# Patient Record
Sex: Female | Born: 2000 | Race: Black or African American | Hispanic: No | Marital: Single | Smoking: Never smoker
Health system: Southern US, Community
[De-identification: ages and names within clinical notes are randomized; demographics above are authoritative.]

## PROBLEM LIST (undated history)

## (undated) DIAGNOSIS — F419 Anxiety disorder, unspecified: Secondary | ICD-10-CM

## (undated) DIAGNOSIS — L309 Dermatitis, unspecified: Secondary | ICD-10-CM

## (undated) DIAGNOSIS — F32A Depression, unspecified: Secondary | ICD-10-CM

## (undated) DIAGNOSIS — L500 Allergic urticaria: Secondary | ICD-10-CM

## (undated) DIAGNOSIS — E559 Vitamin D deficiency, unspecified: Secondary | ICD-10-CM

## (undated) DIAGNOSIS — G610 Guillain-Barre syndrome: Secondary | ICD-10-CM

## (undated) HISTORY — DX: Anxiety disorder, unspecified: F41.9

## (undated) HISTORY — DX: Depression, unspecified: F32.A

## (undated) HISTORY — PX: HIP SURGERY: SHX245

## (undated) HISTORY — DX: Allergic urticaria: L50.0

## (undated) HISTORY — DX: Vitamin D deficiency, unspecified: E55.9

## (undated) HISTORY — DX: Guillain-Barre syndrome: G61.0

---
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
## 2022-01-27 ENCOUNTER — Ambulatory Visit (INDEPENDENT_AMBULATORY_CARE_PROVIDER_SITE_OTHER): Payer: Medicaid Other | Admitting: Neurology

## 2022-01-27 ENCOUNTER — Encounter: Payer: Self-pay | Admitting: Neurology

## 2022-01-27 ENCOUNTER — Encounter: Payer: Medicaid Other | Admitting: Neurology

## 2022-01-27 VITALS — BP 113/76 | HR 108 | Ht 62.0 in | Wt 118.6 lb

## 2022-01-27 DIAGNOSIS — R200 Anesthesia of skin: Secondary | ICD-10-CM

## 2022-01-27 DIAGNOSIS — R29898 Other symptoms and signs involving the musculoskeletal system: Secondary | ICD-10-CM

## 2022-01-27 DIAGNOSIS — G510 Bell's palsy: Secondary | ICD-10-CM

## 2022-01-27 DIAGNOSIS — Z8669 Personal history of other diseases of the nervous system and sense organs: Secondary | ICD-10-CM | POA: Diagnosis not present

## 2022-01-27 NOTE — Patient Instructions (Addendum)
I am not sure I can explain all your findings but we need to do additional testing as explained.  We will do EMG and nerve conduction velocity testing tomorrow in this office.  We will pursue a brain MRI and neck MRI with and without contrast to soon as possible and we will do blood test today.  I would like to exclude a condition called Guillain-Barr syndrome which is an acute form of neuropathy.  If you have any sudden onset of worsening weakness or numbness or shortness of breath, or any new symptoms that are disturbing, please call 911 or proceed to the nearest emergency room.

## 2022-01-27 NOTE — Progress Notes (Signed)
Subjective:    Patient ID: Savannah Alvarez is a 21 y.o. female.  HPI    Alvarez Age, MD, PhD [facility] [address], [address] [place], [place] [address]   Dear Quillian Quince,  Your patient, [name], upon your kind request in my neurologic clinic today for evaluation of her paresthesias.  The patient is accompanied by her mom and her young niece today.  As you know, [name] is a 21 year old female with an underlying medical history of headache, recent right hip surgery and pelvis injury repair some 4 weeks ago, and eczema, who reports a fairly sudden onset of numbness and tingling affecting both upper and lower extremities about a week after her hip surgery.  She was initially using crutches for about a week but then regressed and was even in the wheelchair for a couple of weeks in between and just now graduated back to a walker but is maneuvering it with difficulty.  She has had weakness in the distal upper and lower extremities for the past 3 to maybe 4 weeks.   She checked in with her surgeon who did not think her symptoms were related to her right hip surgery and also did repeat x-rays as I understand.  She presented to the emergency room through [facility] on 12/28/2021 with shortness of breath.  I reviewed the emergency room records.  She had blood work and CT angiogram of the chest for PE evaluation, testing was negative.  She had numbness and tingling at the time.  She also reports a recent episode of Bell's palsy some 2 or 3 weeks ago on the right side which is completely resolved, she was not treated for this especially not with prednisone as she had recently undergone right hip surgery and prednisone was not recommended.  She reports a progressive degree of numbness and weakness in her feet and hands, she feels that she has plateaued at this time, maybe slightly better, shortness of breath is also improved.  She has fallen several times.  She has trouble  maneuvering her 2 wheeled walker due to grip strength weakness.  She had no further imaging test, no recent blood work through your office.  She reports having sustained pelvic fracture and needing reconstruction as well as surgery to the right hip.  She has not yet started physical therapy but has seen a chiropractor.   I reviewed your office visit note from 01/14/2022.  She had right hip surgery in October 2023.  She had sustained an injury during cheerleading as I understand.    I had evaluated her in 2022 for recurrent headaches.  She was last seen in this office by Debbora Presto, NP on 09/09/2021, at which time she was advised to increase gabapentin for headache prevention.  She was advised to take rizatriptan 10 mg strength as needed for migraine headaches.  She was reminded to stay well-hydrated.  She missed an interim appointment with Debbora Presto, NP on 12/23/2020.  Previously:   05/15/20: 21 year old female with an underlying benign medical history except eczema, who Presents for follow-up consultation of her recurrent headaches.  The patient is accompanied by her mom again today.  I first met her on 02/29/2020 at the request of her primary care nurse practitioner, at which time she reported a recent sports related accident about 2 months prior.  She was diagnosed with a mild concussion.  She had a nonfocal exam, overall felt a little better but had recurrent headaches.  She was not sleeping very well.  She also reported a prior history of intermittent migraines.  I suggested she start low-dose amitriptyline.  She was planning to go back to [place] for college.  Her mother called in the interim reporting that she still has headaches.  We arranged for follow-up appointment since she was not able to see somebody in [place].   Today, 05/15/2020: She reports that she can go a day or 2 without headaches but lately, she has had a daily headache, it is typically in the bifrontal and bitemporal areas, can be  constant or throbbing, sometimes she has light sensitivity and nausea, sometimes she does not have nausea.  She stopped taking the amitriptyline earlier this month and noticed a slight worsening of her headache but did not notice any benefit when she was on it and she titrated up to the 2 pills daily.  She has not been doing any cheerleading exercises as she has not been able to participate.  She has been trying to use her eyeglasses consistently and she has tried to hydrate well with water, tries to rest.  She has been taking over-the-counter Tylenol and Advil, Advil seems to help a little more and has infected taking 2 pills every 2-3 hours on a nearly daily basis.  Her primary care nurse practitioner has provided her with a sample of Nurtec, she used it recently and thought it helped a little bit.     02/29/20: (She) had a fall about 2 months ago.  She fell during tumbling, at her college, in [place]. She fell backwards and hit her head, no loss of consciousness or laceration were sustained, thankfully.  She had seen a school physician, the athletic doctor, and was apparently diagnosed with mild concussion.  She did not have any x-rays or other imaging done.  Mom says that she has amnesia for the moments preceding the fall.  I reviewed your office note from 01/12/20. She was advised to go to the ER. She presented to the emergency room on 01/12/2020 with complaints of headaches, light sensitivity, nausea, emotional lability, dizziness, and memory impairment.  I reviewed the emergency room records.  She had a normal neurological exam, normal vital signs, GCS 15.  She did not receive any treatment in the emergency room or imaging tests, was provided with instructions for cognitive rest and proper stepwise return to sports. She reports that she still has intermittent headaches, they occur almost every other day.  They are mostly in the frontal areas, right or left side or in the middle.  She has occasional nausea, no  vomiting, occasional light sensitivity but not every time.  Overall, she feels a little better.  She has been using Tylenol over-the-counter, 500 mg strength 2 pills twice daily or up to 3 times a day.  Mom believes that she is doing better thankfully.  She has occasional blurry vision.  She does have prescription eyeglasses and had a recent eye examination in August 2021 but not since her head injury.  She denies any sudden onset of one-sided weakness or numbness or tingling or droopy face or slurring of speech or hearing loss or vision loss.  No double vision, only occasional blurry vision.  She denies any significant depression or anxiety.  She is currently not on any prescription medication.  She does not sleep very well.    Her Past Medical History Is Significant For: Past Medical History:  Diagnosis Date   Eczema     Her  Past Surgical History Is Significant For: Past Surgical History:  Procedure Laterality Date   HIP SURGERY Right     Her Family History Is Significant For: Family History  Problem Relation Age of Onset   Hypertension Father    Diabetes Father    Hyperthyroidism Mother    Diabetes Maternal Grandmother    Hypertension Maternal Grandmother    Diabetes Maternal Grandfather    Hypertension Maternal Grandfather     Her Social History Is Significant For: Social History   Socioeconomic History   Marital status: Single    Spouse name: Not on file   Number of children: 0   Years of education: Not on file   Highest education level: Some college, no degree  Occupational History    Comment: student in college  Tobacco Use   Smoking status: Never    Passive exposure: Never   Smokeless tobacco: Never  Vaping Use   Vaping Use: Never used  Substance and Sexual Activity   Alcohol use: Never   Drug use: Never   Sexual activity: Yes    Birth control/protection: None  Other Topics Concern   Not on file  Social History Narrative   Lives at college   1 can of [place]  per day   Social Determinants of Health   Financial Resource Strain: Not on file  Food Insecurity: Not on file  Transportation Needs: Not on file  Physical Activity: Not on file  Stress: Not on file  Social Connections: Not on file    Her Allergies Are:  Allergies  Allergen Reactions   Other Swelling    Onions, Cantaloupe & tomato  :   Her Current Medications Are:  Outpatient Encounter Medications as of 01/27/2022  Medication Sig   omalizumab Arvid Right) 150 MG/ML prefilled syringe Inject into the skin.   cetirizine (ZYRTEC) 10 MG tablet TAKE 1 TABLET BY MOUTH EVERY EVENING FOR ITCHING OR RUNNY NOSE (Patient not taking: Reported on 01/27/2022)   diclofenac Sodium (VOLTAREN) 1 % GEL Apply 4 g topically 4 (four) times daily. (Patient not taking: Reported on 01/27/2022)   gabapentin (NEURONTIN) 100 MG capsule Take 1 capsule (100 mg total) by mouth 3 (three) times daily. Take up to 338m daily. (Patient not taking: Reported on 01/27/2022)   meloxicam (MOBIC) 7.5 MG tablet Take 1 tablet (7.5 mg total) by mouth daily. (Patient not taking: Reported on 01/27/2022)   montelukast (SINGULAIR) 10 MG tablet Take 1 tablet (10 mg total) by mouth at bedtime. (Patient not taking: Reported on 01/27/2022)   rizatriptan (MAXALT-MLT) 5 MG disintegrating tablet Take 2 tablets (10 mg total) by mouth as needed for migraine. May repeat in 2 hours if needed, no more than 2 pills/24 h (Patient not taking: Reported on 01/27/2022)   tiZANidine (ZANAFLEX) 4 MG tablet Take 1 tablet (4 mg total) by mouth every 6 (six) hours as needed for muscle spasms. (Patient not taking: Reported on 01/27/2022)   No facility-administered encounter medications on file as of 01/27/2022.  :   Review of Systems:  Out of a complete 14 point review of systems, all are reviewed and negative with the exception of these symptoms as listed below:   Review of Systems  Neurological:        Pt is here for Consult on Numbness and Tingling in both  legs. Pt states that the numbness and tingling started in October. Pt had surgery on right hip. Pt states that she has fallen 7 times. Pt states she has  a fall every week just about.    Objective:  Neurological Exam  Physical Exam Physical Examination:   Vitals:   01/27/22 1029  BP: 113/76  Pulse: (!) 108   General Examination: The patient is a very pleasant 21 y.o. female in no acute distress. She appears well-developed and well-nourished and well groomed.  Mildly anxious appearing, has a 2 wheeled walker.  HEENT: Normocephalic, atraumatic, pupils are equal, round and reactive to light.  No obvious Bell's phenomenon upon eye closure, fairly symmetrical forehead wrinkling.  Extraocular tracking is good without limitation to gaze excursion or nystagmus noted. Normal smooth pursuit is noted.  No photophobia.  Hearing is grossly intact. Face is symmetric with normal facial animation and normal facial sensation. Speech is clear with no dysarthria noted. There is no hypophonia. There is no lip, neck/head, jaw or voice tremor. Neck is supple with full range of passive and active motion. There are no carotid bruits on auscultation. Oropharynx exam reveals: mild mouth dryness, good dental hygiene. Tongue protrudes centrally and palate elevates symmetrically.    Chest: Clear to auscultation without wheezing, rhonchi or crackles noted.   Heart: S1+S2+0, regular and normal without murmurs, rubs or gallops noted.    Abdomen: Soft, non-tender and non-distended with normal bowel sounds appreciated on auscultation.   Extremities: There is no pitting edema in the distal lower extremities bilaterally.    Skin: Warm and dry without trophic changes noted.   Musculoskeletal: exam reveals no obvious joint deformities but has limited range of motion in the right hip with pain reported.    Neurologically:  Mental status: The patient is awake, alert and oriented in all 4 spheres. Her immediate and remote  memory, attention, language skills and fund of knowledge are appropriate. There is no evidence of aphasia, agnosia, apraxia or anomia. Speech is clear with normal prosody and enunciation. Thought process is linear. Mood is normal and affect is normal.  Cranial nerves II - XII are as described above under HEENT exam. Motor exam: Thin bulk, distal strength in both hands about 4 out of 5, distal strength in both feet about 4 out of 5.  Reflexes are trace in the upper extremities, absent in the lower extremities, Babinski testing she has equivocal findings.  She has trouble standing and pushes herself up.  She has trouble standing on her tiptoes.  Proximal strength is closer to 5 out of 5 in the upper and lower extremities but limited with hip flexion on the right. Romberg is not tested due to safety concerns.  Fine motor skills and coordination: She has mild fine motor difficulties with both upper and lower extremities in the hands as well as both feet.   Cerebellar testing: No dysmetria or intention tremor on finger to nose testing. Heel to shin is difficult for her.    Sensory exam: She has decreased sensation to pinprick, vibration, temperature, and light touch in the distal upper and lower extremities, up to above wrist areas bilaterally and up to above ankle areas bilaterally, symmetrical.  Normal sensation in the face.   Gait, station and balance: She stands with difficulty and pushes herself up.  She walks rather slowly with her 2 wheeled walker.  She also reports pain in the right hip.     Assessment and Plan:    In summary, Jaretzi Droz is a very pleasant 21 year old female with an underlying medical history of headache, recent right hip surgery and pelvis injury repair some 4 weeks ago, and eczema,  who presents for evaluation of her rather acute onset of bilateral upper extremity and bilateral lower extremity weakness and numbness affecting her in the distal upper and lower extremities bilaterally  symmetrically.  Symptoms and presentation are concerning for an acute peripheral neuropathy, with differential diagnosis primarily of AIDP or Guillain-Barr syndrome.  She does have decreased strength, decreased sensation, and decreased reflexes particularly distally bilaterally.  Symptoms and findings are symmetrical, she also had a recent episode of Bell's palsy on the right side which seems to have resolved completely without any obvious sequelae.  She also reports recent shortness of breath which could be tied in together, it is possible that she has reached a plateau and may be on the mend but further testing is warranted.  She is advised to proceed with EMG and nerve conduction velocity testing, we were able to find her an appointment for tomorrow in this clinic.  She is advised to do some blood work today, we will check inflammatory markers, CK level, CMP, TSH.  She is advised that I would like to proceed with an outpatient brain MRI and cervical spine MRI with and without contrast.  She had no obvious prodromal illness other than recent surgery but no GI illness, does report occasional nausea recently.  She and her mom are strongly advised to proceed to the emergency room or call 911 should she have shortness of breath, sudden worsening of symptoms or weakness or new symptoms.  Mom reports that they were planning to go on a cruise next week or in about a week from now and I advised him not to go on a cruise or any travel until we have better understanding as to what is going on.  This was an extended visit of over 1 hour due to extended chart review and the multiple tests, explaining the test, also consulting with my colleague Dr. Krista Blue to get her urgently on the schedule for EMG and nerve conduction velocity testing, which I highly appreciate.  I answered all their questions today and the patient and her mom were in agreement with our plan.  Thank you very much for allowing me to participate in the care of  this nice patient. If I can be of any further assistance to you please do not hesitate to call me at [phone].  Sincerely,   Alvarez Age, MD, PhD

## 2022-01-28 ENCOUNTER — Other Ambulatory Visit: Payer: Self-pay

## 2022-01-28 ENCOUNTER — Ambulatory Visit (INDEPENDENT_AMBULATORY_CARE_PROVIDER_SITE_OTHER): Payer: Medicaid Other | Admitting: Neurology

## 2022-01-28 ENCOUNTER — Inpatient Hospital Stay (HOSPITAL_COMMUNITY)
Admission: EM | Admit: 2022-01-28 | Discharge: 2022-02-02 | DRG: 094 | Disposition: A | Discharge status: Home / Self Care | Payer: Medicaid Other | Attending: Internal Medicine | Admitting: Internal Medicine

## 2022-01-28 ENCOUNTER — Ambulatory Visit: Payer: Medicaid Other | Admitting: Neurology

## 2022-01-28 ENCOUNTER — Telehealth: Payer: Self-pay | Admitting: *Deleted

## 2022-01-28 VITALS — BP 141/92 | HR 114 | Ht 62.0 in | Wt 118.0 lb

## 2022-01-28 DIAGNOSIS — G610 Guillain-Barre syndrome: Secondary | ICD-10-CM

## 2022-01-28 DIAGNOSIS — Z91018 Allergy to other foods: Secondary | ICD-10-CM

## 2022-01-28 DIAGNOSIS — G510 Bell's palsy: Secondary | ICD-10-CM

## 2022-01-28 DIAGNOSIS — R269 Unspecified abnormalities of gait and mobility: Secondary | ICD-10-CM

## 2022-01-28 DIAGNOSIS — R296 Repeated falls: Secondary | ICD-10-CM | POA: Diagnosis present

## 2022-01-28 DIAGNOSIS — Q6589 Other specified congenital deformities of hip: Secondary | ICD-10-CM

## 2022-01-28 DIAGNOSIS — Z6821 Body mass index (BMI) 21.0-21.9, adult: Secondary | ICD-10-CM

## 2022-01-28 DIAGNOSIS — M25551 Pain in right hip: Secondary | ICD-10-CM | POA: Diagnosis present

## 2022-01-28 DIAGNOSIS — R200 Anesthesia of skin: Secondary | ICD-10-CM

## 2022-01-28 DIAGNOSIS — E876 Hypokalemia: Secondary | ICD-10-CM | POA: Diagnosis present

## 2022-01-28 DIAGNOSIS — Z8669 Personal history of other diseases of the nervous system and sense organs: Secondary | ICD-10-CM

## 2022-01-28 DIAGNOSIS — H538 Other visual disturbances: Secondary | ICD-10-CM | POA: Diagnosis present

## 2022-01-28 DIAGNOSIS — Z8249 Family history of ischemic heart disease and other diseases of the circulatory system: Secondary | ICD-10-CM

## 2022-01-28 DIAGNOSIS — E43 Unspecified severe protein-calorie malnutrition: Secondary | ICD-10-CM | POA: Diagnosis present

## 2022-01-28 DIAGNOSIS — G8929 Other chronic pain: Secondary | ICD-10-CM | POA: Diagnosis present

## 2022-01-28 DIAGNOSIS — Z833 Family history of diabetes mellitus: Secondary | ICD-10-CM

## 2022-01-28 DIAGNOSIS — D649 Anemia, unspecified: Secondary | ICD-10-CM | POA: Diagnosis present

## 2022-01-28 DIAGNOSIS — R29898 Other symptoms and signs involving the musculoskeletal system: Secondary | ICD-10-CM

## 2022-01-28 LAB — BASIC METABOLIC PANEL
Anion gap: 9 (ref 5–15)
BUN: 17 mg/dL (ref 6–20)
CO2: 22 mmol/L (ref 22–32)
Calcium: 9.9 mg/dL (ref 8.9–10.3)
Chloride: 103 mmol/L (ref 98–111)
Creatinine, Ser: 0.65 mg/dL (ref 0.44–1.00)
GFR, Estimated: >60 mL/min (ref >60)
Glucose, Bld: 85 mg/dL (ref 70–99)
Potassium: 3.5 mmol/L (ref 3.5–5.1)
Sodium: 134 mmol/L — ABNORMAL LOW (ref 135–145)

## 2022-01-28 LAB — URINALYSIS, ROUTINE W REFLEX MICROSCOPIC
Bilirubin Urine: NEGATIVE
Glucose, UA: NEGATIVE mg/dL
Hgb urine dipstick: NEGATIVE
Ketones, ur: 80 mg/dL — AB
Leukocytes,Ua: NEGATIVE
Nitrite: NEGATIVE
Protein, ur: 30 mg/dL — AB
Specific Gravity, Urine: 1.026 (ref 1.005–1.030)
pH: 5 (ref 5.0–8.0)

## 2022-01-28 LAB — CBC
HCT: 34.4 % — ABNORMAL LOW (ref 36.0–46.0)
Hemoglobin: 10.7 g/dL — ABNORMAL LOW (ref 12.0–15.0)
MCH: 25.5 pg — ABNORMAL LOW (ref 26.0–34.0)
MCHC: 31.1 g/dL (ref 30.0–36.0)
MCV: 82.1 fL (ref 80.0–100.0)
Platelets: 457 10*3/uL — ABNORMAL HIGH (ref 150–400)
RBC: 4.19 MIL/uL (ref 3.87–5.11)
RDW: 14.6 % (ref 11.5–15.5)
WBC: 5.3 10*3/uL (ref 4.0–10.5)
nRBC: 0 % (ref 0.0–0.2)

## 2022-01-28 LAB — I-STAT BETA HCG BLOOD, ED (MC, WL, AP ONLY): I-stat hCG, quantitative: <5 m[IU]/mL (ref <5)

## 2022-01-28 MED ORDER — ONDANSETRON 4 MG PO TBDP
4.0000 mg | ORAL_TABLET | Freq: Once | ORAL | Status: AC
  Administered 2022-01-28: 4 mg via ORAL
  Filled 2022-01-28: qty 1

## 2022-01-28 NOTE — ED Provider Triage Note (Signed)
Emergency Medicine Provider Triage Evaluation Note  Savannah Alvarez , a 21 y.o. female  was evaluated in triage.  Pt complains of weakness. Report having extremities weakness, numbness and recurrent falls sinces Oct of this year.  Was seen by neurologist Dr. 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  Sent here for admission, likely IVIG or plasma exchange, will need LP for crystalalbumin dissociation.  Endorse nausea.  No fever  Review of Systems  Positive: As above Negative: As above  Physical Exam  BP 130/82 (BP Location: Right Arm)   Pulse 100   Temp 98.1 F (36.7 C) (Oral)   Resp 16   SpO2 100%  Gen:   Awake, no distress   Resp:  Normal effort  MSK:   Moves extremities without difficulty  Other:    Medical Decision Making  Medically screening exam initiated at 4:29 PM.  Appropriate orders placed.  Savannah Alvarez was informed that the remainder of the evaluation will be completed by another provider, this initial triage assessment does not replace that evaluation, and the importance of remaining in the ED until their evaluation is complete.    Savannah Helper, PA-C 01/28/22 1631

## 2022-01-28 NOTE — Telephone Encounter (Signed)
Fyi.

## 2022-01-28 NOTE — ED Triage Notes (Signed)
Pt presents after being sent by Neurologist.  Pt states she has not been able to feel her fingers/hands/feet x 3 weeks.  Had nerve conduction study today and was told to come here for eval.

## 2022-01-28 NOTE — Procedures (Signed)
Full Name: Savannah Alvarez Gender: Female MRN #: [id number] Date of Birth: 02/12/2001    Visit Date: 01/28/2022 12:48 Age: 21 Years Examining Physician: Dr. Levert Feinstein Referring Physician: Dr. Frances Furbish Height: 5 feet 2 inch History: 21 year old female status post right periacetabular osteotomy on December 15, 2021, developed acute onset of bilateral upper and lower extremity numbness and weakness a week after, reached nadir about 4 weeks after symptoms onset, not still have persistent distal weakness numbness, fell multiple times  Summary of the Test:  Nerve conduction study:  Right sural, superficial peroneal, ulnar, median sensory responses were absent.  Right tibial, peroneal to EDB and ulnar motor responses were absent.  Electromyography: Selected needle examination of right upper, lower extremity muscles, right cervical and lumbosacral paraspinal muscles; right facial muscle performed.  There is evidence of increased insertional activity, mild spontaneous activity at distal upper and lower extremity muscles, with complex large motor unit potential decreased recruitment patterns.  There is also evidence of neuropathic changes involving right orbicularis oculi and frontalis muscles.  There is also evidence of increased insertional activity, polyphasic complex motor unit potential at right lumbosacral paraspinals, suggestive of remodeling process.   Conclusion: This is an abnormal study.  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    ------------------------------- Levert Feinstein M.D. PhD  [facility] [address] [place], [place] [address] Tel: [phone] Fax: [phone]  Verbal informed consent was obtained from the patient, patient was informed of potential risk of procedure, including bruising, bleeding, hematoma formation, infection, muscle  weakness, muscle pain, numbness, among others.        MNC    Nerve / Sites Muscle Latency Ref. Amplitude Ref. Rel Amp Segments Distance Ref. Area    ms ms mV mV %  cm m/s mVms  R Ulnar - ADM     Wrist ADM 21.9 ?3.3 0.1 ?6.0 100 Wrist - ADM 7       B.Elbow ADM 21.5  1.4  1825 B.Elbow - Wrist  ?49      A.Elbow ADM      A.Elbow - B.Elbow  ?49   R Peroneal - EDB     Ankle EDB NR ?6.5 NR ?2.0 NR Ankle - EDB 9  NR         Pop fossa - Ankle     R Tibial - AH     Ankle AH NR ?5.8 NR ?4.0 NR Ankle - AH 9  NR             SNC    Nerve / Sites Rec. Site Peak Lat Ref.  Amp Ref. Segments Distance    ms ms V V  cm  R Radial - Anatomical snuff box (Forearm)     Forearm Wrist 2.7 ?2.9 5 ?15 Forearm - Wrist 10  R Sural - Ankle (Calf)     Calf Ankle NR ?4.4 NR ?6 Calf - Ankle 14  R Superficial peroneal - Ankle     Lat leg Ankle NR ?4.4 NR ?6 Lat leg - Ankle 14  R Median - Orthodromic (Dig II, Mid palm)     Dig II Wrist NR ?3.4 NR ?10 Dig II - Wrist 13  R Ulnar - Orthodromic, (Dig V, Mid palm)     Dig V Wrist NR ?3.1 NR ?5 Dig V - Wrist 11  EMG Summary Table    Spontaneous MUAP Recruitment  Muscle IA Fib PSW Fasc Other Amp Dur. Poly Pattern  R. Tibialis anterior Increased 1+ None None _______ Increased Increased 1+ Reduced  R. Tibialis posterior Increased 1+ None None _______ Increased Increased 1+ Reduced  R. Peroneus longus Increased None None None _______ Increased Increased 1+ Reduced  R. Gastrocnemius (Medial head) Increased None None None _______ Normal Normal Normal Reduced  R. Vastus lateralis Normal None None None _______ Normal Normal Normal Reduced  R. Lumbar paraspinals (low) Normal None None None _______ Normal Normal Normal Normal  R. Lumbar paraspinals (mid) Normal None None None _______ Normal Normal Normal Normal  R. First dorsal interosseous Increased 1+ None None _______ Increased Increased 1+ Reduced  R. Abductor digiti minimi (manus) Increased 1+ None None  _______ Increased Increased 1+ Reduced  R. Brachioradialis Increased None None None _______ Normal Normal Normal Reduced  R. Biceps brachii Normal None None None _______ Normal Normal Normal Reduced  R. Deltoid Normal None None None _______ Normal Normal Normal Reduced  R. Triceps brachii Normal None None None _______ Normal Normal Normal Reduced  R. Cervical paraspinals Normal None None None _______ Normal Normal Normal Normal  R. Frontalis Increased None None None _______ Increased Increased 1+ Reduced  R. Orbicularis oculi Increased None None None _______ Increased Increased 1+ Reduced

## 2022-01-28 NOTE — Progress Notes (Signed)
Chief Complaint  Patient presents with   Procedure    Rm EMG/NCV 4.       ASSESSMENT AND PLAN  Savannah Alvarez is a 21 y.o. 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  DIAGNOSTIC DATA (LABS, IMAGING, TESTING) - I reviewed patient records, labs, notes, testing and imaging myself where available.  Laboratory evaluation January 27, 2022, normal or negative RPR, TSH, ANA, B6, vitamin D, A1c, rheumatoid factor, B1 CPK slightly elevated 188, repeat CMP showed normal liver functional test, calcium was mildly elevated 10.5, ESR was elevated 45, hemoglobin has improved to 11.8   MEDICAL HISTORY:  Savannah Alvarez is a 21 years old female, accompanied by her mother, referred by Dr. Rexene Alberts for electrodiagnostic study for subacute onset weakness, numbness.    I reviewed and summarized the referring note. PMHX.  She had right hip surgery periacetabular osteotomy at [facility] on December 15, 2021, initially recovered well,  pain was well controlled  Second week around December 22, 2021 woke up noticed numbness tingling and numbness of shooting pain at bilateral lower extremity from ankle down, bilateral hands from the wrist down, she also noticed clumsiness of right hand, could not hold her crutches well, fell multiple times since then  Was treated at [facility] emergency room on December 28, 2021, also with complaints of shortness of breath, facial weakness, was found to have mild anemia hemoglobin of 10, mild elevation of AST 84 ALT of 112, magnesium was decreased 1.8, vitamin D level was less than 8  She was given magnesium supplement,  CT PE was negative  EKG showed sinus rhythm, heart rate of 96 bpm  Follow-up visit with orthopedic surgeon December 30, 2021 showed postsurgical change of right periacetabular osteotomy, no new fracture, no hardware complications,  Orthopedic follow-up again January 13, 2022, with complaints of worsening upper and lower extremity weakness, numbness, also developed facial weakness, was given the diagnosis of Bell's palsy, need eyedrop to close her eyes, fell several times, no longer needing narcotics for pain control,  Patient reported her worst time was before Thanksgiving, when she has shortness of breath, palpitation, difficulty closing her eyes, profound bilateral upper and lower extremity weakness, numbness, since then, she seems to have made slow progress, still have numbness, but not as dense, not as painful, continue to have weakness, she can close her eyes a little bit better,  Electrodiagnostic study January 28, 2022 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, mainly demyelinating, with mild axonal component   PHYSICAL EXAM:   Vitals:   01/28/22 1256  BP: (!) 141/92  Pulse: (!) 114  Weight: 118 lb (  53.5 kg)  Height: _0  (1.575 m)   Body mass index is 21.58 kg/m.  PHYSICAL EXAMNIATION:  Gen: NAD, conversant, well nourised, well groomed                      Cardiovascular: Regular rate rhythm, no peripheral edema, warm, nontender. Eyes: Conjunctivae clear without exudates or hemorrhage Neck: Supple, no carotid bruits. Pulmonary: Clear to auscultation bilaterally   NEUROLOGICAL EXAM:  MENTAL STATUS: Speech/cognition: Tearful at today's clinical visit CRANIAL NERVES: CN II: Visual fields are full to confrontation. Pupils are round equal and briskly reactive to light. CN III, IV, VI: extraocular movement are normal. No ptosis. CN V: Facial sensation is intact to light touch CN VII: Face is symmetric with normal eye closure  CN VIII: Hearing is normal to causal conversation. CN IX, X: Phonation is normal. CN XI: Head turning and shoulder shrug are intact  MOTOR: Proximal upper extremity motor strength is normal, mild bilateral handgrip, mild to moderate finger flexion extension weakness.  Right lower extremity motor strength is limited due to recent right hip surgery, mild left hip flexion weakness, moderate left ankle dorsiflexion and plantarflexion weakness  REFLEXES: Areflexia  SENSORY: Length-dependent decreased light touch, pinprick, vibratory sensation to mid shin level, absent toe proprioception, Decreased finger vibratory sensation, and finger proprioception  COORDINATION: There is no trunk or limb dysmetria noted.  GAIT/STANCE: Need help to get up from seated position, rely on her walker, very unsteady  REVIEW OF SYSTEMS:  Full 14 system review of systems performed and notable only for as above All other review of systems were negative.   ALLERGIES: Allergies  Allergen Reactions   Other Swelling    Onions, Cantaloupe & tomato    HOME MEDICATIONS: Current Outpatient Medications  Medication Sig Dispense Refill   cetirizine (ZYRTEC) 10 MG tablet TAKE 1 TABLET BY MOUTH EVERY EVENING FOR ITCHING OR RUNNY NOSE (Patient not taking: Reported on 01/27/2022) 30 tablet 5   diclofenac Sodium (VOLTAREN) 1 % GEL  Apply 4 g topically 4 (four) times daily. (Patient not taking: Reported on 01/27/2022) 100 g 0   gabapentin (NEURONTIN) 100 MG capsule Take 1 capsule (100 mg total) by mouth 3 (three) times daily. Take up to 345m daily. (Patient not taking: Reported on 01/27/2022) 30 capsule 3   meloxicam (MOBIC) 7.5 MG tablet Take 1 tablet (7.5 mg total) by mouth daily. (Patient not taking: Reported on 01/27/2022) 20 tablet 0   montelukast (SINGULAIR) 10 MG tablet Take 1 tablet (10 mg total) by mouth at bedtime. (Patient not taking: Reported on 01/27/2022) 30 tablet 5   omalizumab (XOLAIR) 150 MG/ML prefilled syringe Inject into the skin.     rizatriptan (MAXALT-MLT) 5 MG disintegrating tablet Take 2 tablets (10 mg total) by mouth as needed for migraine. May repeat in 2 hours if needed, no more than 2 pills/24 h (Patient not taking: Reported on 01/27/2022) 10 tablet 5   tiZANidine (ZANAFLEX) 4 MG tablet Take 1 tablet (4 mg total) by mouth every 6 (six) hours as needed for muscle spasms. (Patient not taking: Reported on 01/27/2022) 30 tablet 0   No current facility-administered medications for this visit.    PAST MEDICAL HISTORY: Past Medical History:  Diagnosis Date   Eczema     PAST SURGICAL HISTORY: Past Surgical History:  Procedure Laterality Date   HIP SURGERY Right     FAMILY HISTORY: Family History  Problem Relation Age of Onset   Hypertension Father  Diabetes Father    Hyperthyroidism Mother    Diabetes Maternal Grandmother    Hypertension Maternal Grandmother    Diabetes Maternal Grandfather    Hypertension Maternal Grandfather     SOCIAL HISTORY: Social History   Socioeconomic History   Marital status: Single    Spouse name: Not on file   Number of children: 0   Years of education: Not on file   Highest education level: Some college, no degree  Occupational History    Comment: student in college  Tobacco Use   Smoking status: Never    Passive exposure: Never   Smokeless  tobacco: Never  Vaping Use   Vaping Use: Never used  Substance and Sexual Activity   Alcohol use: Never   Drug use: Never   Sexual activity: Yes    Birth control/protection: None  Other Topics Concern   Not on file  Social History Narrative   Lives at college   1 can of [place] per day   Social Determinants of Health   Financial Resource Strain: Not on file  Food Insecurity: Not on file  Transportation Needs: Not on file  Physical Activity: Not on file  Stress: Not on file  Social Connections: Not on file  Intimate Partner Violence: Not on file      Marcial Pacas, M.D. Ph.D.  [facility] [address] [place], [place] [address] Ph: [phone] Fax: [phone]  CC:  Jeannene Patella, [place] [address],  [place] [id number]  Jeannene Patella, PA

## 2022-01-28 NOTE — Telephone Encounter (Signed)
-----   Message from Huston Foley, MD sent at 01/28/2022  3:02 PM EST ----- Nothing further to do, patient and mom have been notified while they were here and advised to proceed to the ER for admission for AIDP (GBS), w/u and treament.

## 2022-01-28 NOTE — ED Notes (Signed)
Pt's mother notified RN that she was going to give pt's scheduled medications while they are waiting.

## 2022-01-28 NOTE — ED Provider Notes (Signed)
[facility] EMERGENCY DEPARTMENT Provider Note   CSN: [id number] Arrival date & time: 01/28/22  1426     History  Chief Complaint  Patient presents with   Numbness    Nikky Duba is a 21 y.o. female.  21 y/o female presents to the ED at the request of Neurology for admission. She has been having progressive numbness and weakness of her extremities with increased falls. 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 She states that she woke up with numbness and pain in her b/l hands and feet. She was "unable to move my toes correctly". The symptoms have remained constant and are worsening. She began to have increased falls because she was unable to grip her crutches well. Patient reports some associated b/l blurry vision which is worse after waking in the morning and at night before bed. She also developed symptoms of Bell's palsy ~3 weeks ago on the left, including weakness of the L eyelid and L side of her mouth. These symptoms have been gradually improving. Denies fevers.  Patient had an EMG prior to arrival which was concerning for AIDP. Was sent to the ED for admission and IVIG or plasma exchange.  The history is provided by the patient. No language interpreter was used.       Home Medications Prior to Admission medications   Medication Sig Start Date End Date Taking? Authorizing Provider  acetaminophen (TYLENOL) 500 MG tablet Take 500 mg by mouth every 6 (six) hours as needed for moderate pain.   Yes [provider]  diazepam (VALIUM) 5 MG tablet Take 5 mg by mouth every 8 (eight) hours as needed for anxiety.   Yes [provider]  famotidine (PEPCID) 20 MG tablet Take 20 mg by mouth daily as needed for heartburn or indigestion.   Yes [provider]  omalizumab Geoffry Paradise) 150 MG/ML prefilled syringe Inject 300 mg into the skin every 28 (twenty-eight) days. 05/03/20  Yes [provider]   ondansetron (ZOFRAN) 4 MG tablet Take 4 mg by mouth every 8 (eight) hours as needed for nausea or vomiting.   Yes [provider]  oxyCODONE (ROXICODONE) 15 MG immediate release tablet Take 7.5 mg by mouth every 4 (four) hours as needed for pain.   Yes [provider]  sertraline (ZOLOFT) 50 MG tablet Take 50 mg by mouth daily.   Yes [provider]  cetirizine (ZYRTEC) 10 MG tablet TAKE 1 TABLET BY MOUTH EVERY EVENING FOR ITCHING OR RUNNY NOSE Patient not taking: Reported on 01/27/2022 07/21/16   Kozlow, Alvira Philips, MD  diclofenac Sodium (VOLTAREN) 1 % GEL Apply 4 g topically 4 (four) times daily. Patient not taking: Reported on 01/27/2022 08/31/21   Gailen Shelter, PA  gabapentin (NEURONTIN) 100 MG capsule Take 1 capsule (100 mg total) by mouth 3 (three) times daily. Take up to 300mg  daily. Patient not taking: Reported on 01/27/2022 09/09/20   Lomax, Amy, NP  meloxicam (MOBIC) 7.5 MG tablet Take 1 tablet (7.5 mg total) by mouth daily. Patient not taking: Reported on 01/27/2022 08/31/21   11/01/21, PA  montelukast (SINGULAIR) 10 MG tablet Take 1 tablet (10 mg total) by mouth at bedtime. Patient not taking: Reported on 01/27/2022 07/21/16   07/23/16, MD  rizatriptan (MAXALT-MLT) 5 MG disintegrating tablet Take 2 tablets (10 mg total) by mouth as needed for migraine. May repeat in 2 hours if needed, no more than 2  pills/24 h Patient not taking: Reported on 01/27/2022 09/09/20   Lomax, Amy, NP  tiZANidine (ZANAFLEX) 4 MG tablet Take 1 tablet (4 mg total) by mouth every 6 (six) hours as needed for muscle spasms. Patient not taking: Reported on 01/27/2022 09/23/20   Wallis BambergMani, Mario, PA-C      Allergies    Other    Review of Systems   Review of Systems Ten systems reviewed and are negative for acute change, except as noted in the HPI.    Physical Exam Updated Vital Signs BP 126/74 (BP Location: Right Arm)   Pulse 87   Temp 97.9 F (36.6 C) (Oral)   Resp 16   Ht 5\' 2"   (1.575 m)   Wt 53.3 kg   SpO2 100%   BMI 21.49 kg/m   Physical Exam Vitals and nursing note reviewed.  Constitutional:      General: She is not in acute distress.    Appearance: She is well-developed. She is not diaphoretic.     Comments: Nontoxic appearing and in NAD  HENT:     Head: Normocephalic and atraumatic.  Eyes:     General: No scleral icterus.    Conjunctiva/sclera: Conjunctivae normal.     Comments: Weakness of the L eyelid. EOMs intact.  Pulmonary:     Effort: Pulmonary effort is normal. No respiratory distress.     Comments: Respirations even and unlabored Musculoskeletal:        General: Normal range of motion.     Cervical back: Normal range of motion.  Skin:    General: Skin is warm and dry.     Coloration: Skin is not pale.     Findings: No erythema or rash.  Neurological:     Mental Status: She is alert and oriented to person, place, and time.     Comments: GCS 15. Speech is clear, goal oriented. There is flattening of the left nasolabial fold; weakness to the left corder of the mouth. Sensation to light touch absent in BLE; unable to differentiate between touch of the left or right side. 4/5 strength in BLE with preserved strength against resistance in all major muscle groups of bilateral upper extremities. Grips 5/5 on exam.  Psychiatric:        Behavior: Behavior normal.     ED Results / Procedures / Treatments   Labs (all labs ordered are listed, but only abnormal results are displayed) Labs Reviewed  BASIC METABOLIC PANEL - Abnormal; Notable for the following components:      Result Value   Sodium 134 (*)    All other components within normal limits  CBC - Abnormal; Notable for the following components:   Hemoglobin 10.7 (*)    HCT 34.4 (*)    MCH 25.5 (*)    Platelets 457 (*)    All other components within normal limits  URINALYSIS, ROUTINE W REFLEX MICROSCOPIC - Abnormal; Notable for the following components:   APPearance HAZY (*)     Ketones, ur 80 (*)    Protein, ur 30 (*)    Bacteria, UA FEW (*)    All other components within normal limits  CSF CULTURE W GRAM STAIN  CSF CELL COUNT WITH DIFFERENTIAL  PROTEIN AND GLUCOSE, CSF  OLIGOCLONAL BANDS, CSF + SERM  DRAW EXTRA CLOT TUBE  MENINGITIS/ENCEPHALITIS PANEL (CSF)  IGG CSF INDEX  DRAW EXTRA CLOT TUBE  LYME DISEASE DNA BY PCR(BORRELIA BURG)  VDRL, CSF  CK  MAGNESIUM  PHOSPHORUS  HEPATIC FUNCTION  PANEL  PREALBUMIN  TSH  VITAMIN B12  FOLATE  IRON AND TIBC  FERRITIN  RETICULOCYTES  CBG MONITORING, ED  I-STAT BETA HCG BLOOD, ED ([facility], [facility], [facility] ONLY)    EKG EKG Interpretation  Date/Time:  Wednesday January 28 2022 16:42:07 EST Ventricular Rate:  100 PR Interval:  116 QRS Duration: 84 QT Interval:  320 QTC Calculation: 412 R Axis:   84 Text Interpretation: Normal sinus rhythm Nonspecific ST and T wave abnormality Abnormal ECG Interpretation limited secondary to artifact No previous ECGs available Confirmed by Elayne Snare ([id number]) on 01/28/2022 9:20:30 PM  Radiology NCV with EMG(electromyography)  Result Date: 01/28/2022 Levert Feinstein, MD     01/28/2022  2:34 PM     Full Name: Sariyah Corcino Gender: Female MRN #: [id number] Date of Birth: [birth year]   Visit Date: 01/28/2022 12:48 Age: 60 Years Examining Physician: Dr. Levert Feinstein Referring Physician: Dr. Frances Furbish Height: 5 feet 2 inch History: 21 year old female status post right periacetabular osteotomy on December 15, 2021, developed acute onset of bilateral upper and lower extremity numbness and weakness a week after, reached nadir about 4 weeks after symptoms onset, not still have persistent distal weakness numbness, fell multiple times Summary of the Test: Nerve conduction study: Right sural, superficial peroneal, ulnar, median sensory responses were absent. Right tibial, peroneal to EDB and ulnar motor responses were absent. Electromyography: Selected needle examination of right upper, lower extremity muscles, right  cervical and lumbosacral paraspinal muscles; right facial muscle performed. There is evidence of increased insertional activity, mild spontaneous activity at distal upper and lower extremity muscles, with complex large motor unit potential decreased recruitment patterns. There is also evidence of neuropathic changes involving right orbicularis oculi and frontalis muscles. There is also evidence of increased insertional activity, polyphasic complex motor unit potential at right lumbosacral paraspinals, suggestive of remodeling process. Conclusion: This is an abnormal study.  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 ------------------------------- Levert Feinstein M.D. PhD [facility] [address], [address] [place], [place] [address] Tel: [phone] Fax: [phone] Verbal informed consent was obtained from the patient, patient was informed of potential risk of procedure, including bruising, bleeding, hematoma formation, infection, muscle weakness, muscle pain, numbness, among others.     MNC   Nerve / Sites Muscle Latency Ref. Amplitude Ref. Rel Amp Segments Distance Ref. Area   ms ms mV mV %  cm m/s mVms R Ulnar - ADM    Wrist ADM 21.9 ?3.3 0.1 ?6.0 100 Wrist - ADM 7      B.Elbow ADM 21.5  1.4  1825 B.Elbow - Wrist  ?49     A.Elbow ADM      A.Elbow - B.Elbow  ?49  R Peroneal - EDB    Ankle EDB NR ?6.5 NR ?2.0 NR Ankle - EDB 9  NR        Pop fossa - Ankle    R Tibial - AH    Ankle AH NR ?5.8 NR ?4.0 NR Ankle - AH 9  NR           SNC   Nerve / Sites Rec. Site Peak Lat Ref.  Amp Ref. Segments Distance   ms ms V V  cm R Radial - Anatomical snuff box (Forearm)    Forearm Wrist 2.7 ?2.9 5 ?15 Forearm - Wrist 10 R Sural - Ankle (Calf)    Calf Ankle NR ?4.4 NR ?6 Calf - Ankle 14 R Superficial  peroneal - Ankle    Lat leg Ankle NR ?4.4 NR ?6 Lat leg - Ankle 14 R Median - Orthodromic (Dig II, Mid palm)    Dig II  Wrist NR ?3.4 NR ?10 Dig II - Wrist 13 R Ulnar - Orthodromic, (Dig V, Mid palm)    Dig V Wrist NR ?3.1 NR ?5 Dig V - Wrist 11             EMG Summary Table   Spontaneous MUAP Recruitment Muscle IA Fib PSW Fasc Other Amp Dur. Poly Pattern R. Tibialis anterior Increased 1+ None None _______ Increased Increased 1+ Reduced R. Tibialis posterior Increased 1+ None None _______ Increased Increased 1+ Reduced R. Peroneus longus Increased None None None _______ Increased Increased 1+ Reduced R. Gastrocnemius (Medial head) Increased None None None _______ Normal Normal Normal Reduced R. Vastus lateralis Normal None None None _______ Normal Normal Normal Reduced R. Lumbar paraspinals (low) Normal None None None _______ Normal Normal Normal Normal R. Lumbar paraspinals (mid) Normal None None None _______ Normal Normal Normal Normal R. First dorsal interosseous Increased 1+ None None _______ Increased Increased 1+ Reduced R. Abductor digiti minimi (manus) Increased 1+ None None _______ Increased Increased 1+ Reduced R. Brachioradialis Increased None None None _______ Normal Normal Normal Reduced R. Biceps brachii Normal None None None _______ Normal Normal Normal Reduced R. Deltoid Normal None None None _______ Normal Normal Normal Reduced R. Triceps brachii Normal None None None _______ Normal Normal Normal Reduced R. Cervical paraspinals Normal None None None _______ Normal Normal Normal Normal R. Frontalis Increased None None None _______ Increased Increased 1+ Reduced R. Orbicularis oculi Increased None None None _______ Increased Increased 1+ Reduced     Procedures .Lumbar Puncture  Date/Time: 01/29/2022 1:37 AM  Performed by: Antony Madura, PA-C Authorized by: Antony Madura, PA-C   Consent:    Consent obtained:  Verbal   Consent given by:  Patient   Risks, benefits, and alternatives were discussed: yes     Risks discussed:  Bleeding, pain, infection and headache   Alternatives discussed:  Delayed  treatment Universal protocol:    Procedure explained and questions answered to patient or proxy's satisfaction: yes     Relevant documents present and verified: yes     Test results available: yes     Imaging studies available: yes     Required blood products, implants, devices, and special equipment available: yes     Immediately prior to procedure a time out was called: yes     Site/side marked: yes     Patient identity confirmed:  Verbally with patient and arm band Pre-procedure details:    Procedure purpose:  Diagnostic Anesthesia:    Anesthesia method:  Local infiltration   Local anesthetic:  Lidocaine 1% w/o epi Procedure details:    Lumbar space:  L3-L4 interspace   Patient position:  Sitting   Needle gauge:  22   Needle length (in):  3.5   Ultrasound guidance: no     Number of attempts:  1   Fluid appearance:  Blood-tinged then clearing   Tubes of fluid:  4   Total volume (ml):  10 Post-procedure details:    Puncture site:  Adhesive bandage applied and direct pressure applied   Procedure completion:  Tolerated well, no immediate complications     Medications Ordered in ED Medications  ondansetron (ZOFRAN-ODT) disintegrating tablet 4 mg (4 mg Oral Given 01/28/22 1733)    ED Course/ Medical Decision Making/ A&P Clinical Course  as of 01/29/22 0143  Thu Jan 29, 2022  0031 Case discussed with Dr. Adela Glimpse who will admit. Patient has been seen by Dr. Derry Lory of Neurology; neurology will formally consult on patient case. [KH]    Clinical Course User Index [KH] Antony Madura, PA-C                           Medical Decision Making Amount and/or Complexity of Data Reviewed Labs: ordered.  Risk Prescription drug management. Decision regarding hospitalization.   This patient presents to the ED for concern of Guillain Barre Syndrome, this involves an extensive number of treatment options, and is a complaint that carries with it a high risk of complications and  morbidity.    Co morbidities that complicate the patient evaluation  Eczema   Additional history obtained:  Additional history obtained from mother, at bedside External records from outside source obtained and reviewed including outpatient EMG from today concerning for GBS   Lab Tests:  I Ordered, and personally interpreted labs.  The pertinent results include: Acute leukopenia of 2.9, anemia of 10.7 (chronic, fairly stable), Mg of 1.6   Cardiac Monitoring:  The patient was maintained on a cardiac monitor.  I personally viewed and interpreted the cardiac monitored which showed an underlying rhythm of: NSR   Medicines ordered and prescription drug management:  I have reviewed the patients home medicines and have made adjustments as needed   Test Considered:  MRI brain   Critical Interventions:  Lumbar puncture completed   Consultations Obtained:  I requested consultation with Dr. Derry Lory of neurology and discussed lab and imaging findings as well as pertinent plan - they recommend: admission and lumbar puncture be completed in the ED prior to admit.   Problem List / ED Course:  As above   Reevaluation:  After the interventions noted above, I reevaluated the patient and found that they have :stayed the same   Social Determinants of Health:  Insured patient Good social support   Dispostion:  After consideration of the diagnostic results and the patients response to treatment, I feel that the patent would benefit from admission for medical management. Dr. Adela Glimpse of [facility] to admit. Neurology to follow.          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

## 2022-01-29 ENCOUNTER — Telehealth: Payer: Self-pay | Admitting: Neurology

## 2022-01-29 ENCOUNTER — Encounter (HOSPITAL_COMMUNITY): Payer: Self-pay | Admitting: Internal Medicine

## 2022-01-29 DIAGNOSIS — Z8249 Family history of ischemic heart disease and other diseases of the circulatory system: Secondary | ICD-10-CM | POA: Diagnosis not present

## 2022-01-29 DIAGNOSIS — E43 Unspecified severe protein-calorie malnutrition: Secondary | ICD-10-CM | POA: Diagnosis present

## 2022-01-29 DIAGNOSIS — Q6589 Other specified congenital deformities of hip: Secondary | ICD-10-CM | POA: Diagnosis not present

## 2022-01-29 DIAGNOSIS — D649 Anemia, unspecified: Secondary | ICD-10-CM | POA: Diagnosis present

## 2022-01-29 DIAGNOSIS — Z833 Family history of diabetes mellitus: Secondary | ICD-10-CM | POA: Diagnosis not present

## 2022-01-29 DIAGNOSIS — R296 Repeated falls: Secondary | ICD-10-CM | POA: Diagnosis present

## 2022-01-29 DIAGNOSIS — G610 Guillain-Barre syndrome: Secondary | ICD-10-CM | POA: Diagnosis present

## 2022-01-29 DIAGNOSIS — H538 Other visual disturbances: Secondary | ICD-10-CM | POA: Diagnosis present

## 2022-01-29 DIAGNOSIS — Z6821 Body mass index (BMI) 21.0-21.9, adult: Secondary | ICD-10-CM | POA: Diagnosis not present

## 2022-01-29 DIAGNOSIS — Z91018 Allergy to other foods: Secondary | ICD-10-CM | POA: Diagnosis not present

## 2022-01-29 DIAGNOSIS — M25551 Pain in right hip: Secondary | ICD-10-CM | POA: Diagnosis present

## 2022-01-29 DIAGNOSIS — G8929 Other chronic pain: Secondary | ICD-10-CM | POA: Diagnosis present

## 2022-01-29 DIAGNOSIS — E876 Hypokalemia: Secondary | ICD-10-CM | POA: Diagnosis present

## 2022-01-29 LAB — COMPREHENSIVE METABOLIC PANEL
ALT: 17 U/L (ref 0–44)
AST: 21 U/L (ref 15–41)
Albumin: 3.4 g/dL — ABNORMAL LOW (ref 3.5–5.0)
Alkaline Phosphatase: 50 U/L (ref 38–126)
Anion gap: 7 (ref 5–15)
BUN: 13 mg/dL (ref 6–20)
CO2: 23 mmol/L (ref 22–32)
Calcium: 9.5 mg/dL (ref 8.9–10.3)
Chloride: 106 mmol/L (ref 98–111)
Creatinine, Ser: 0.66 mg/dL (ref 0.44–1.00)
GFR, Estimated: >60 mL/min (ref >60)
Glucose, Bld: 98 mg/dL (ref 70–99)
Potassium: 3.3 mmol/L — ABNORMAL LOW (ref 3.5–5.1)
Sodium: 136 mmol/L (ref 135–145)
Total Bilirubin: 0.6 mg/dL (ref 0.3–1.2)
Total Protein: 7.3 g/dL (ref 6.5–8.1)

## 2022-01-29 LAB — RETICULOCYTES
Immature Retic Fract: 11.9 % (ref 2.3–15.9)
RBC.: 3.65 MIL/uL — ABNORMAL LOW (ref 3.87–5.11)
Retic Count, Absolute: 39.8 10*3/uL (ref 19.0–186.0)
Retic Ct Pct: 1.1 % (ref 0.4–3.1)

## 2022-01-29 LAB — PROTEIN AND GLUCOSE, CSF
Glucose, CSF: 60 mg/dL (ref 40–70)
Total  Protein, CSF: 45 mg/dL (ref 15–45)

## 2022-01-29 LAB — IRON AND TIBC
Iron: 22 ug/dL — ABNORMAL LOW (ref 28–170)
Saturation Ratios: 6 % — ABNORMAL LOW (ref 10.4–31.8)
TIBC: 403 ug/dL (ref 250–450)
UIBC: 381 ug/dL

## 2022-01-29 LAB — MENINGITIS/ENCEPHALITIS PANEL (CSF)

## 2022-01-29 LAB — CBC
HCT: 29.6 % — ABNORMAL LOW (ref 36.0–46.0)
Hemoglobin: 9.5 g/dL — ABNORMAL LOW (ref 12.0–15.0)
MCH: 26.2 pg (ref 26.0–34.0)
MCHC: 32.1 g/dL (ref 30.0–36.0)
MCV: 81.5 fL (ref 80.0–100.0)
Platelets: 343 10*3/uL (ref 150–400)
RBC: 3.63 MIL/uL — ABNORMAL LOW (ref 3.87–5.11)
RDW: 14.5 % (ref 11.5–15.5)
WBC: 2.9 10*3/uL — ABNORMAL LOW (ref 4.0–10.5)
nRBC: 0 % (ref 0.0–0.2)

## 2022-01-29 LAB — HEPATIC FUNCTION PANEL
ALT: 17 U/L (ref 0–44)
AST: 23 U/L (ref 15–41)
Albumin: 3.5 g/dL (ref 3.5–5.0)
Alkaline Phosphatase: 56 U/L (ref 38–126)
Bilirubin, Direct: <0.1 mg/dL (ref 0.0–0.2)
Total Bilirubin: 0.4 mg/dL (ref 0.3–1.2)
Total Protein: 6.8 g/dL (ref 6.5–8.1)

## 2022-01-29 LAB — HIV ANTIBODY (ROUTINE TESTING W REFLEX)
HIV Screen 4th Generation wRfx: NONREACTIVE
HIV Screen 4th Generation wRfx: NONREACTIVE

## 2022-01-29 LAB — CSF CELL COUNT WITH DIFFERENTIAL
RBC Count, CSF: 0 /mm3
RBC Count, CSF: 8 /mm3 — ABNORMAL HIGH
Tube #: 1
Tube #: 1
WBC, CSF: 1 /mm3 (ref 0–5)
WBC, CSF: 2 /mm3 (ref 0–5)

## 2022-01-29 LAB — MAGNESIUM
Magnesium: 1.6 mg/dL — ABNORMAL LOW (ref 1.7–2.4)
Magnesium: 1.5 mg/dL — ABNORMAL LOW (ref 1.7–2.4)

## 2022-01-29 LAB — PHOSPHORUS
Phosphorus: 3.3 mg/dL (ref 2.5–4.6)
Phosphorus: 3.8 mg/dL (ref 2.5–4.6)

## 2022-01-29 LAB — FERRITIN: Ferritin: 22 ng/mL (ref 11–307)

## 2022-01-29 LAB — RPR: RPR Ser Ql: NONREACTIVE

## 2022-01-29 LAB — PREALBUMIN: Prealbumin: 28 mg/dL (ref 18–38)

## 2022-01-29 LAB — VITAMIN B12: Vitamin B-12: 363 pg/mL (ref 180–914)

## 2022-01-29 LAB — PREGNANCY, URINE: Preg Test, Ur: NEGATIVE

## 2022-01-29 LAB — TSH: TSH: 1.383 u[IU]/mL (ref 0.350–4.500)

## 2022-01-29 LAB — FOLATE: Folate: 12.3 ng/mL (ref >5.9)

## 2022-01-29 LAB — CK: Total CK: 106 U/L (ref 38–234)

## 2022-01-29 MED ORDER — ACETAMINOPHEN 650 MG RE SUPP: 650.0000 mg | Freq: Four times a day (QID) | RECTAL | Status: DC | PRN

## 2022-01-29 MED ORDER — SODIUM CHLORIDE 0.9 % IV SOLN: INTRAVENOUS | Status: AC

## 2022-01-29 MED ORDER — MAGNESIUM SULFATE 2 GM/50ML IV SOLN
2.0000 g | Freq: Once | INTRAVENOUS | Status: AC
  Administered 2022-01-29: 2 g via INTRAVENOUS
  Filled 2022-01-29: qty 50

## 2022-01-29 MED ORDER — IMMUNE GLOBULIN (HUMAN) 10 GM/100ML IV SOLN
400.0000 mg/kg | INTRAVENOUS | Status: AC
  Administered 2022-01-29 – 2022-02-02 (×5): 20 g via INTRAVENOUS
  Filled 2022-01-29 (×5): qty 200

## 2022-01-29 MED ORDER — HYDROCODONE-ACETAMINOPHEN 5-325 MG PO TABS
1.0000 | ORAL_TABLET | ORAL | Status: DC | PRN
  Administered 2022-01-29: 2 via ORAL
  Administered 2022-01-30 – 2022-02-02 (×6): 1 via ORAL
  Filled 2022-01-29 (×6): qty 1
  Filled 2022-01-29: qty 2

## 2022-01-29 MED ORDER — POTASSIUM CHLORIDE CRYS ER 20 MEQ PO TBCR
40.0000 meq | EXTENDED_RELEASE_TABLET | Freq: Once | ORAL | Status: AC
  Administered 2022-01-29: 40 meq via ORAL
  Filled 2022-01-29: qty 2

## 2022-01-29 MED ORDER — ACETAMINOPHEN 325 MG PO TABS
650.0000 mg | ORAL_TABLET | Freq: Four times a day (QID) | ORAL | Status: DC | PRN
  Administered 2022-01-30: 650 mg via ORAL
  Filled 2022-01-29 (×2): qty 2

## 2022-01-29 MED ORDER — HYDROCODONE-ACETAMINOPHEN 5-325 MG PO TABS
1.0000 | ORAL_TABLET | Freq: Once | ORAL | Status: AC
  Administered 2022-01-29: 1 via ORAL
  Filled 2022-01-29: qty 1

## 2022-01-29 MED ORDER — SODIUM CHLORIDE 0.9 % IV BOLUS
1000.0000 mL | INTRAVENOUS | Status: AC
  Administered 2022-01-29 – 2022-02-02 (×5): 1000 mL via INTRAVENOUS

## 2022-01-29 NOTE — Consult Note (Signed)
NEUROLOGY CONSULTATION NOTE   Date of service: January 29, 2022 Patient Name: Savannah Alvarez MRN:  [id number] DOB:  10/23/2000 Reason for consult: "BL lower ext weakness since early nov, saw outpatient neurologist and EMG concerning for AIDP" Requesting Provider: Therisa Doyne, MD _ _ _   _ __   _ __ _ _  __ __   _ __   __ _  History of Present Illness  Savannah Alvarez is a 21 y.o. female with PMH significant for eczema, hip injury 3 years ago from cheerleading s/p repair on oct 23rd. She reports that on oct 30th, she woke up with tingling in BL hands and feet. This progressively worsened to weakness in all extremities. She has had 7 falls since mid November. Drops things from her hands, unable to hold forks and spoons correctly. Knees buckle, feet bend out and has to look down at her feet to be able to walk.  She was eventually seen by Dr. Terrace Arabia and had EMG.NCS which confirmed acute demyelinating polyradiculoneuropathy, mild axonal component. She was sent to the ED for further treatment with IVIG vs PLEX and LP.  Reports no prior similar symptoms. Does have hx of allergies to unknown substances/triggers where she breaks out into hives. Reports family hx of diabetes and ataxia.  She is sexually active and has had 3 partners over the last year, no hx of STDs in patient or her partners that she is aware of. Does not hike. No EtOh, no smoking, no recreational substances.   ROS   Constitutional Denies weight loss, fever and chills.   HEENT Denies changes in vision and hearing.   Respiratory Denies SOB and cough.   CV Denies palpitations and CP   GI + abdominal pain, nausea since surgery  GU Denies dysuria and urinary frequency.   MSK Denies myalgia and joint pain.   Skin Denies rash and pruritus.   Neurological Denies headache and syncope.   Psychiatric Denies recent changes in mood. Endorses hx of anxiety and depression. No hx of SI or suicide attempt.   Past History   Past Medical  History:  Diagnosis Date   Eczema    Past Surgical History:  Procedure Laterality Date   HIP SURGERY Right    Family History  Problem Relation Age of Onset   Hypertension Father    Diabetes Father    Hyperthyroidism Mother    Diabetes Maternal Grandmother    Hypertension Maternal Grandmother    Diabetes Maternal Grandfather    Hypertension Maternal Grandfather    Social History   Socioeconomic History   Marital status: Single    Spouse name: Not on file   Number of children: 0   Years of education: Not on file   Highest education level: Some college, no degree  Occupational History    Comment: student in college  Tobacco Use   Smoking status: Never    Passive exposure: Never   Smokeless tobacco: Never  Vaping Use   Vaping Use: Never used  Substance and Sexual Activity   Alcohol use: Never   Drug use: Never   Sexual activity: Yes    Birth control/protection: None  Other Topics Concern   Not on file  Social History Narrative   Lives at college   1 can of [place] per day   Social Determinants of Health   Financial Resource Strain: Not on file  Food Insecurity: Not on file  Transportation Needs: Not on file  Physical Activity: Not on file  Stress: Not on file  Social Connections: Not on file   Allergies  Allergen Reactions   Other Swelling    Onions, Cantaloupe & tomato    Medications  (Not in a hospital admission)    Vitals   Vitals:   01/28/22 1600 01/28/22 1809 01/28/22 1812 01/29/22 0130  BP: 130/82 122/81 128/70 126/74  Pulse: 100 95 95 87  Resp: 16 16 16 16   Temp: 98.1 F (36.7 C) 98.7 F (37.1 C) 98.3 F (36.8 C) 97.9 F (36.6 C)  TempSrc: Oral Oral Oral Oral  SpO2: 100%  100% 100%  Weight:    53.3 kg  Height:    5\' 2"  (1.575 m)     Body mass index is 21.49 kg/m.  Physical Exam   General: Laying comfortably in bed; in no acute distress.  HENT: Normal oropharynx and mucosa. Normal external appearance of ears and nose.  Neck:  Supple, no pain or tenderness  CV: No JVD. No peripheral edema.  Pulmonary: Symmetric Chest rise. Normal respiratory effort.  Abdomen: Soft to touch, non-tender.  Ext: No cyanosis, edema, or deformity  Skin: No rash. Normal palpation of skin.   Musculoskeletal: Normal digits and nails by inspection. No clubbing.   Neurologic Examination  Mental status/Cognition: Alert, oriented to self, place, month and year, good attention.  Speech/language: Fluent, comprehension intact, object naming intact, repetition intact.  Cranial nerves:   CN II Pupils equal and reactive to light, no VF deficits    CN III,IV,VI EOM intact, no gaze preference or deviation, no nystagmus    CN V normal sensation in V1, V2, and V3 segments bilaterally    CN VII no asymmetry, no nasolabial fold flattening    CN VIII normal hearing to speech    CN IX & X normal palatal elevation, no uvular deviation    CN XI 5/5 head turn and 5/5 shoulder shrug bilaterally    CN XII midline tongue protrusion    Motor:  Muscle bulk: normal, tone normal, pronator drift none tremor none  Neck flexion: 4/5 Neck extension: 4/5  Mvmt Root Nerve  Muscle Right Left Comments  SA C5/6 Ax Deltoid 4+ 4+   EF C5/6 Mc Biceps 4+ 4+   EE C6/7/8 Rad Triceps 4+ 4+   WF C6/7 Med FCR     WE C7/8 PIN ECU     F Ab C8/T1 U ADM/FDI 4+ 4+   HF L1/2/3 Fem Illopsoas  4 Deferred eval of RLE due to recent surgery and told to avoid weight bearing.  KE L2/3/4 Fem Quad  4   DF L4/5 D Peron Tib Ant  4   PF S1/2 Tibial Grc/Sol  4    Reflexes:  Right Left Comments  Pectoralis      Biceps (C5/6) 0 0   Brachioradialis (C5/6) 1 1    Triceps (C6/7) 0 0    Patellar (L3/4) - 0    Achilles (S1) 0 0    Hoffman      Plantar     Jaw jerk    Sensation:  Light touch Absent in BL legs from mid shin and below, absent in BL upper extremities from mid forearm and below.   Pin prick    Temperature    Vibration Absent in BL feet and hands.  Proprioception     Coordination/Complex Motor:  - Finger to Nose intact BL - Heel to shin unable to do - Rapid alternating movement are slowed. - Gait: deferred for patient  safety.  Labs   CBC:  Recent Labs  Lab 01/27/22 1107 01/28/22 1635  WBC 4.0 5.3  NEUTROABS 1.1*  --   HGB 11.8 10.7*  HCT 36.2 34.4*  MCV 79 82.1  PLT 501* 457*    Basic Metabolic Panel:  Lab Results  Component Value Date   NA 134 (L) 01/28/2022   K 3.5 01/28/2022   CO2 22 01/28/2022   GLUCOSE 85 01/28/2022   BUN 17 01/28/2022   CREATININE 0.65 01/28/2022   CALCIUM 9.9 01/28/2022   GFRNONAA >60 01/28/2022   Lipid Panel: No results found for: "LDLCALC" HgbA1c:  Lab Results  Component Value Date   HGBA1C 5.1 01/27/2022   Urine Drug Screen: No results found for: "LABOPIA", "COCAINSCRNUR", "LABBENZ", "AMPHETMU", "THCU", "LABBARB"  Alcohol Level No results found for: "ETH"  Labs: B12, folate, TSH normal and non revealing.  LP with CSF studies: Pending.   Impression   Meleane Selinger is a 21 y.o. female with PMH significant for eczema, hip injury 3 years ago from cheerleading s/p repair on oct 23rd, a week later had tingling in BL hands and feet and reports progressive worsening weakness and numbness which seems to have stabilized. EMG/NCS concerning for demyelinating AIDP with mild axonal involvement.  She was sent to the ED by outpatient neurology for LP and for treatment with IVIG/PLEX.  I discussed risks, benefits, alternatives with patient and her mother including trying IVIG, PLEX. IVIG/PLEX are equivalent in terms of their efficacy and have shown to shorten disease course.  Patient opted to go for IVIG at this time.  Recommendations  - IVIG 0.4g/Kg every 24 hours x 5 doses. Will give fluids bolus prior to IVIG. - Every 12 hours hours NIFs and VC - NPO until swallow eval - PT/OT. - LP with cell count and differential x 2, protein, glucose, OCB, IgG index, meningitis/encephalitis panel, lyme, VDRL. -  serum HIV, RPR, Vit B6, SPEP, urine pregnancy test.(Obtained permission from patient prior to testing for HIV and syphilis)  ______________________________________________________________________   Thank you for the opportunity to take part in the care of this patient. If you have any further questions, please contact the neurology consultation attending.  Signed,  Erick Blinks [facility] Pager Number [phone] _ _ _   _ __   _ __ _ _  __ __   _ __   __ _

## 2022-01-29 NOTE — Evaluation (Signed)
Clinical/Bedside Swallow Evaluation Patient Details  Name: Savannah Alvarez MRN: [id number] Date of Birth: [birth year]  Today's Date: 01/29/2022 Time: SLP Start Time (ACUTE ONLY): 0947 SLP Stop Time (ACUTE ONLY): 0959 SLP Time Calculation (min) (ACUTE ONLY): 12 min  Past Medical History:  Past Medical History:  Diagnosis Date   Eczema    Past Surgical History:  Past Surgical History:  Procedure Laterality Date   HIP SURGERY Right    HPI:  Pt is a 21 y.o. female who Presented with lower extremities weakness and numbness. Started to have numbness in her hands and feet, progressed to not moving toes, increased falls and weakness, weakness of eyelids. Now no sensation in lower extremities. This started after having hip surgery for acquired hip dysplasia. MRI pending. PMH: headache, recent right hip surgery and pelvis injury repair some 4 weeks ago, and eczema.    Assessment / Plan / Recommendation  Clinical Impression  Pt presents with functional oropharyngeal swallow at bedside this am. Pt and mother report hx of Bell's Palsy about 4 weeks ago, which resulted in weakness on R side of mouth. Pt stated that, at that time, dry breads/crackers were difficult to masticate and clear due to oral weakness, including reduced lingual ROM for sweeping of food. Oral mechanism examination this date was [facility]. No gross asymmetry or weakness noted. Pt self- fed consecutive sips of thin liquids, bites of puree and regular textures without s/sx of aspiration. Mastication was swift and full oral clearance of solids achieved. Recommend continue regular diet/thin liquids with adherence to universal swallow precautions. No further SLP f/u indicated at this time. Will s/o.  SLP Visit Diagnosis: Dysphagia, unspecified (R13.10)    Aspiration Risk  Mild aspiration risk    Diet Recommendation Regular;Thin liquid   Liquid Administration via: Cup;Straw Medication Administration: Whole meds with liquid Supervision: Patient  able to self feed Compensations: Minimize environmental distractions;Slow rate;Small sips/bites Postural Changes: Seated upright at 90 degrees    Other  Recommendations Oral Care Recommendations: Oral care BID    Recommendations for follow up therapy are one component of a multi-disciplinary discharge planning process, led by the attending physician.  Recommendations may be updated based on patient status, additional functional criteria and insurance authorization.  Follow up Recommendations No SLP follow up      Assistance Recommended at Discharge    Functional Status Assessment Patient has not had a recent decline in their functional status  Frequency and Duration            Prognosis Prognosis for Safe Diet Advancement: Good      Swallow Study   General Date of Onset: 01/28/22 HPI: Pt is a 21 y.o. female who Presented with lower extremities weakness and numbness. Started to have numbness in her hands and feet, progressed to not moving toes, increased falls and weakness, weakness of eyelids. Now no sensation in lower extremities. This started after having hip surgery for acquired hip dysplasia. MRI pending. PMH: headache, recent right hip surgery and pelvis injury repair some 4 weeks ago, and eczema. Type of Study: Bedside Swallow Evaluation Previous Swallow Assessment: none per EMR Diet Prior to this Study: Regular;Thin liquids Temperature Spikes Noted: No Respiratory Status: Room air History of Recent Intubation: No Behavior/Cognition: Alert;Cooperative;Pleasant mood Oral Cavity Assessment: Within Functional Limits Oral Care Completed by SLP: No Oral Cavity - Dentition: Adequate natural dentition Vision: Functional for self-feeding Self-Feeding Abilities: Able to feed self Patient Positioning: Upright in bed;Postural control adequate for testing Baseline Vocal Quality: Normal Volitional  Cough: Strong Volitional Swallow: Able to elicit    Oral/Motor/Sensory Function  Overall Oral Motor/Sensory Function: Within functional limits   Ice Chips Ice chips: Not tested   Thin Liquid Thin Liquid: Within functional limits Presentation: Self Fed;Straw    Nectar Thick Nectar Thick Liquid: Not tested   Honey Thick Honey Thick Liquid: Not tested   Puree Puree: Within functional limits Presentation: Self Fed;Spoon   Solid     Solid: Within functional limits Presentation: Self Fed       Avie Echevaria, MA, CCC-SLP Acute Rehabilitation Services Office Number: [phone]  Paulette Blanch 01/29/2022,10:53 AM

## 2022-01-29 NOTE — Telephone Encounter (Signed)
Yes she is at the hospital and is going to be admitted.

## 2022-01-29 NOTE — H&P (Signed)
Savannah SnipeKayla Manocchio ZOX:[id number] DOB: 01/05/2001 DOA: 01/28/2022     PCP: Remo LippsWaddell, Daniel P, PA   Outpatient Specialists:    NEurology    Dr.Saima Athar,     Patient arrived to ER on 01/28/22 at 1426 Referred by Attending Horton, Mayer Maskerourtney F, MD   Patient coming from:    home Lives   With family    Chief Complaint:   Chief Complaint  Patient presents with   Numbness    HPI: Savannah Alvarez is a 21 y.o. female with medical history significant of  headache, recent right hip surgery and pelvis injury repair some 4 weeks ago, and eczema     Presented with   lower extremities weakness and numbness Started to have numbness in her hands and feet, progressed to not moving toes, increased falls and weakness, weakness of eyelids Now no sensation in lower extremities  This started after having hip surgery for acquired hip dysplasia Neurology seen in office and diagnosed with GBS she was sent in to ER for plasmapheresis, LP and IVIG   no GI illness, Hxof chest pain for the past 1 month Had CTa 1 m ago that was negative     Chronic anemia - baseline hg Hemoglobin & Hematocrit  Recent Labs    09/01/21 2139 01/27/22 1107 01/28/22 1635  HGB 10.8* 11.8 10.7*    While in ER: Clinical Course as of 01/29/22 0043  Thu Jan 29, 2022  0031 Case discussed with Dr. Adela Glimpseoutova who will admit. Patient has been seen by Dr. Derry LoryKhaliqdina of Neurology; neurology will formally consult on patient case. [KH]    Clinical Course User Index [KH] Antony MaduraHumes, Kelly, PA-C   Neurology was consulted  Plan for LP   Following Medications were ordered in ER: Medications  ondansetron (ZOFRAN-ODT) disintegrating tablet 4 mg (4 mg Oral Given 01/28/22 1733)    _______________________________________________ ER Provider Called: Neurology     Dr.Khaliqdina They Recommend admit to medicine    SEEN in ER   ED Triage Vitals  Enc Vitals Group     BP 01/28/22 1600 130/82     Pulse Rate 01/28/22 1600 100     Resp 01/28/22 1600  16     Temp 01/28/22 1600 98.1 F (36.7 C)     Temp Source 01/28/22 1600 Oral     SpO2 01/28/22 1600 100 %     Weight --      Height --      Head Circumference --      Peak Flow --      Pain Score 01/28/22 1623 0     Pain Loc --      Pain Edu? --      Excl. in GC? --   TMAX(24)@     _________________________________________ Significant initial  Findings: Abnormal Labs Reviewed  BASIC METABOLIC PANEL - Abnormal; Notable for the following components:      Result Value   Sodium 134 (*)    All other components within normal limits  CBC - Abnormal; Notable for the following components:   Hemoglobin 10.7 (*)    HCT 34.4 (*)    MCH 25.5 (*)    Platelets 457 (*)    All other components within normal limits  URINALYSIS, ROUTINE W REFLEX MICROSCOPIC - Abnormal; Notable for the following components:   APPearance HAZY (*)    Ketones, ur 80 (*)    Protein, ur 30 (*)    Bacteria, UA FEW (*)    All  other components within normal limits      ECG: Ordered Personally reviewed and interpreted by me showing: HR : 100 Rhythm: Normal sinus rhythm Nonspecific ST and T wave abnormality Abnormal ECG   QTC 412   The recent clinical data is shown below. Vitals:   01/28/22 1600 01/28/22 1809 01/28/22 1812  BP: 130/82 122/81 128/70  Pulse: 100 95 95  Resp: Temp: 98.1 F (36.7 C) 98.7 F (37.1 C) 98.3 F (36.8 C)  TempSrc: Oral Oral Oral  SpO2: 100%  100%      WBC     Component Value Date/Time   WBC 5.3 01/28/2022 1635   LYMPHSABS 2.5 01/27/2022 1107   MONOABS 1.0 09/01/2021 2139   EOSABS 0.0 01/27/2022 1107   BASOSABS 0.0 01/27/2022 1107     UA   no evidence of UTI     Urine analysis:    Component Value Date/Time   COLORURINE YELLOW 01/28/2022 1705   APPEARANCEUR HAZY (A) 01/28/2022 1705   LABSPEC 1.026 01/28/2022 1705   PHURINE 5.0 01/28/2022 1705   GLUCOSEU NEGATIVE 01/28/2022 1705   HGBUR NEGATIVE 01/28/2022 1705   BILIRUBINUR NEGATIVE 01/28/2022 1705    KETONESUR 80 (A) 01/28/2022 1705   PROTEINUR 30 (A) 01/28/2022 1705   NITRITE NEGATIVE 01/28/2022 1705   LEUKOCYTESUR NEGATIVE 01/28/2022 1705     _______________________________________________ Hospitalist was called for admission for   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   The following Work up has been ordered so far:  Orders Placed This Encounter  Procedures   CSF culture w Gram Stain   Basic metabolic panel   CBC   Urinalysis, Routine w reflex microscopic   CSF cell count with differential collection tube #: 1 & 4   Protein and glucose, CSF   Oligoclonal bands, CSF + serum   Draw extra clot tube   Meningitis/Encephalitis Panel (CSF)   IgG CSF index   Draw extra clot tube   Lyme disease dna by pcr(borrelia burg)   VDRL, CSF   Document Height and Actual Weight   Lumbar puncture tray to bedside   Lumbar Puncture: Order Tray and Sterile Gloves   Consult to hospitalist   CBG monitoring, ED   I-Stat beta hCG blood, ED   ED EKG     OTHER Significant initial  Findings:  labs showing:  Recent Labs  Lab 01/27/22 1107 01/28/22 1635  NA 137 134*  K 5.2 3.5  CO2 17* 22  GLUCOSE 85 85  BUN 15 17  CREATININE 0.76 0.65  CALCIUM 10.5* 9.9    Cr    stable,  Lab Results  Component Value Date   CREATININE 0.65 01/28/2022   CREATININE 0.76 01/27/2022   CREATININE 0.75 09/01/2021    Recent Labs  Lab 01/27/22 1107  AST 38  ALT 21  ALKPHOS 85  BILITOT 0.7  PROT 8.6*  ALBUMIN 4.9   Lab Results  Component Value Date   CALCIUM 9.9 01/28/2022    Plt: Lab Results  Component Value Date   PLT 457 (H) 01/28/2022    COVID-19 Labs  Recent Labs    01/27/22 1107  CRP <1    Lab Results  Component Value Date   SARSCOV2NAA NEGATIVE 09/01/2021       Recent Labs  Lab 01/27/22 1107 01/28/22 1635  WBC 4.0 5.3  NEUTROABS 1.1*  --   HGB 11.8 10.7*  HCT 36.2 34.4*  MCV 79 82.1  PLT 501*  457*    HG/HCT    stable,     Component Value Date/Time   HGB 10.7 (L) 01/28/2022 1635   HGB 11.8 01/27/2022 1107   HCT 34.4 (L) 01/28/2022 1635   HCT 36.2 01/27/2022 1107   MCV 82.1 01/28/2022 1635   MCV 79 01/27/2022 1107     Cardiac Panel (last 3 results) Recent Labs    01/27/22 1107  CKTOTAL 188*    .car BNP (last 3 results) No results for input(s): "BNP" in the last 8760 hours.    DM  labs:  HbA1C: Recent Labs    01/27/22 1107  HGBA1C 5.1    Cultures:    Component Value Date/Time   SDES  09/01/2021 2344    BLOOD RIGHT ANTECUBITAL Performed at [facility], [address] [place], [place] [address]    SDES  09/01/2021 2344    BLOOD LEFT ANTECUBITAL Performed at [facility], [address], [place], [place] [address]    [facility]  09/01/2021 2344    BOTTLES DRAWN AEROBIC AND ANAEROBIC Blood Culture adequate volume Performed at [facility], [address], [place], [place] [address]    [facility]  09/01/2021 2344    BOTTLES DRAWN AEROBIC AND ANAEROBIC Blood Culture adequate volume Performed at [facility], [address] [place], [place] [address]    CULT  09/01/2021 2344    NO GROWTH 5 DAYS Performed at [facility], [address], [place], [place] [address]    CULT  09/01/2021 2344    NO GROWTH 5 DAYS Performed at [facility], [address], [place], [place] [address]    REPTSTATUS 09/07/2021 FINAL 09/01/2021 2344   REPTSTATUS 09/07/2021 FINAL 09/01/2021 2344     Radiological Exams on Admission: NCV with EMG(electromyography)  Result Date: 01/28/2022 Levert Feinstein, MD     01/28/2022  2:34 PM     Full Name: Mischelle Reeg Gender: Female MRN #: [id number] Date of Birth: [birth year]   Visit Date: 01/28/2022 12:48 Age: 55 Years Examining Physician: Dr. Levert Feinstein Referring Physician: Dr. Frances Furbish Height: 5 feet 2 inch History: 21 year old female status post right periacetabular  osteotomy on December 15, 2021, developed acute onset of bilateral upper and lower extremity numbness and weakness a week after, reached nadir about 4 weeks after symptoms onset, not still have persistent distal weakness numbness, fell multiple times Summary of the Test: Nerve conduction study: Right sural, superficial peroneal, ulnar, median sensory responses were absent. Right tibial, peroneal to EDB and ulnar motor responses were absent. Electromyography: Selected needle examination of right upper, lower extremity muscles, right cervical and lumbosacral paraspinal muscles; right facial muscle performed. There is evidence of increased insertional activity, mild spontaneous activity at distal upper and lower extremity muscles, with complex large motor unit potential decreased recruitment patterns. There is also evidence of neuropathic changes involving right orbicularis oculi and frontalis muscles. There is also evidence of increased insertional activity, polyphasic complex motor unit potential at right lumbosacral paraspinals, suggestive of remodeling process. Conclusion: This is an abnormal study.  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 ------------------------------- Levert Feinstein M.D. PhD [facility] [address] [place], [place] [address] Tel: [phone] Fax: [phone] Verbal informed consent was obtained from the patient, patient was informed of potential risk of procedure, including bruising, bleeding, hematoma formation, infection, muscle weakness, muscle pain, numbness, among others.     MNC   Nerve /  Sites Muscle Latency Ref. Amplitude Ref. Rel Amp Segments Distance Ref. Area   ms ms mV mV %  cm m/s mVms R Ulnar - ADM    Wrist ADM 21.9 ?3.3 0.1 ?6.0 100 Wrist - ADM 7      B.Elbow ADM 21.5  1.4  1825 B.Elbow - Wrist  ?49     A.Elbow ADM      A.Elbow - B.Elbow  ?49  R Peroneal -  EDB    Ankle EDB NR ?6.5 NR ?2.0 NR Ankle - EDB 9  NR        Pop fossa - Ankle    R Tibial - AH    Ankle AH NR ?5.8 NR ?4.0 NR Ankle - AH 9  NR           SNC   Nerve / Sites Rec. Site Peak Lat Ref.  Amp Ref. Segments Distance   ms ms V V  cm R Radial - Anatomical snuff box (Forearm)    Forearm Wrist 2.7 ?2.9 5 ?15 Forearm - Wrist 10 R Sural - Ankle (Calf)    Calf Ankle NR ?4.4 NR ?6 Calf - Ankle 14 R Superficial peroneal - Ankle    Lat leg Ankle NR ?4.4 NR ?6 Lat leg - Ankle 14 R Median - Orthodromic (Dig II, Mid palm)    Dig II Wrist NR ?3.4 NR ?10 Dig II - Wrist 13 R Ulnar - Orthodromic, (Dig V, Mid palm)    Dig V Wrist NR ?3.1 NR ?5 Dig V - Wrist 11             EMG Summary Table   Spontaneous MUAP Recruitment Muscle IA Fib PSW Fasc Other Amp Dur. Poly Pattern R. Tibialis anterior Increased 1+ None None _______ Increased Increased 1+ Reduced R. Tibialis posterior Increased 1+ None None _______ Increased Increased 1+ Reduced R. Peroneus longus Increased None None None _______ Increased Increased 1+ Reduced R. Gastrocnemius (Medial head) Increased None None None _______ Normal Normal Normal Reduced R. Vastus lateralis Normal None None None _______ Normal Normal Normal Reduced R. Lumbar paraspinals (low) Normal None None None _______ Normal Normal Normal Normal R. Lumbar paraspinals (mid) Normal None None None _______ Normal Normal Normal Normal R. First dorsal interosseous Increased 1+ None None _______ Increased Increased 1+ Reduced R. Abductor digiti minimi (manus) Increased 1+ None None _______ Increased Increased 1+ Reduced R. Brachioradialis Increased None None None _______ Normal Normal Normal Reduced R. Biceps brachii Normal None None None _______ Normal Normal Normal Reduced R. Deltoid Normal None None None _______ Normal Normal Normal Reduced R. Triceps brachii Normal None None None _______ Normal Normal Normal Reduced R. Cervical paraspinals Normal None None None _______ Normal Normal Normal Normal R.  Frontalis Increased None None None _______ Increased Increased 1+ Reduced R. Orbicularis oculi Increased None None None _______ Increased Increased 1+ Reduced    _______________________________________________________________________________________________________ Latest  Blood pressure 128/70, pulse 95, temperature 98.3 F (36.8 C), temperature source Oral, resp. rate 16, SpO2 100 %.   Vitals  labs and radiology finding personally reviewed  Review of Systems:    Pertinent positives include:   localizing neurological complaints,  tingling, weakness,  Constitutional:  No weight loss, night sweats, Fevers, chills, fatigue, weight loss  HEENT:  No headaches, Difficulty swallowing,Tooth/dental problems,Sore throat,  No sneezing, itching, ear ache, nasal congestion, post nasal drip,  Cardio-vascular:  No chest pain, Orthopnea, PND, anasarca, dizziness, palpitations.no Bilateral lower extremity swelling  GI:  No heartburn,  indigestion, abdominal pain, nausea, vomiting, diarrhea, change in bowel habits, loss of appetite, melena, blood in stool, hematemesis Resp:  no shortness of breath at rest. No dyspnea on exertion, No excess mucus, no productive cough, No non-productive cough, No coughing up of blood.No change in color of mucus.No wheezing. Skin:  no rash or lesions. No jaundice GU:  no dysuria, change in color of urine, no urgency or frequency. No straining to urinate.  No flank pain.  Musculoskeletal:  No joint pain or no joint swelling. No decreased range of motion. No back pain.  Psych:  No change in mood or affect. No depression or anxiety. No memory loss.  Neuro:  no double vision, no gait abnormality, no slurred speech, no confusion  All systems reviewed and apart from HOPI all are negative _______________________________________________________________________________________________ Past Medical History:   Past Medical History:  Diagnosis Date   Eczema       Past  Surgical History:  Procedure Laterality Date   HIP SURGERY Right     Social History:  Ambulatory  walker       reports that she has never smoked. She has never been exposed to tobacco smoke. She has never used smokeless tobacco. She reports that she does not drink alcohol and does not use drugs.     Family History:   Family History  Problem Relation Age of Onset   Hypertension Father    Diabetes Father    Hyperthyroidism Mother    Diabetes Maternal Grandmother    Hypertension Maternal Grandmother    Diabetes Maternal Grandfather    Hypertension Maternal Grandfather    ______________________________________________________________________________________________ Allergies: Allergies  Allergen Reactions   Other Swelling    Onions, Cantaloupe & tomato     Prior to Admission medications   Medication Sig Start Date End Date Taking? Authorizing Provider  acetaminophen (TYLENOL) 500 MG tablet Take 500 mg by mouth every 6 (six) hours as needed for moderate pain.   Yes [provider]  diazepam (VALIUM) 5 MG tablet Take 5 mg by mouth every 8 (eight) hours as needed for anxiety.   Yes [provider]  famotidine (PEPCID) 20 MG tablet Take 20 mg by mouth daily as needed for heartburn or indigestion.   Yes [provider]  omalizumab Geoffry Paradise) 150 MG/ML prefilled syringe Inject 300 mg into the skin every 28 (twenty-eight) days. 05/03/20  Yes [provider]  ondansetron (ZOFRAN) 4 MG tablet Take 4 mg by mouth every 8 (eight) hours as needed for nausea or vomiting.   Yes [provider]  oxyCODONE (ROXICODONE) 15 MG immediate release tablet Take 7.5 mg by mouth every 4 (four) hours as needed for pain.   Yes [provider]  sertraline (ZOLOFT) 50 MG tablet Take 50 mg by mouth daily.   Yes [provider]  cetirizine (ZYRTEC) 10 MG tablet TAKE 1 TABLET BY MOUTH EVERY EVENING FOR ITCHING OR RUNNY NOSE Patient not taking:  Reported on 01/27/2022 07/21/16   Kozlow, Alvira Philips, MD  diclofenac Sodium (VOLTAREN) 1 % GEL Apply 4 g topically 4 (four) times daily. Patient not taking: Reported on 01/27/2022 08/31/21   Gailen Shelter, PA  gabapentin (NEURONTIN) 100 MG capsule Take 1 capsule (100 mg total) by mouth 3 (three) times daily. Take up to 300mg  daily. Patient not taking: Reported on 01/27/2022 09/09/20   Lomax, Amy, NP  meloxicam (MOBIC) 7.5 MG tablet Take 1 tablet (7.5 mg total) by mouth daily. Patient not taking: Reported on 01/27/2022  08/31/21   Fondaw, Wylder S, PA  montelukast (SINGULAIR) 10 MG tablet Take 1 tablet (10 mg total) by mouth at bedtime. Patient not taking: Reported on 01/27/2022 07/21/16   Jessica Priest, MD  rizatriptan (MAXALT-MLT) 5 MG disintegrating tablet Take 2 tablets (10 mg total) by mouth as needed for migraine. May repeat in 2 hours if needed, no more than 2 pills/24 h Patient not taking: Reported on 01/27/2022 09/09/20   Lomax, Amy, NP  tiZANidine (ZANAFLEX) 4 MG tablet Take 1 tablet (4 mg total) by mouth every 6 (six) hours as needed for muscle spasms. Patient not taking: Reported on 01/27/2022 09/23/20   Wallis Bamberg, PA-C    ___________________________________________________________________________________________________ Physical Exam:    01/28/2022    6:12 PM 01/28/2022    6:09 PM 01/28/2022    4:00 PM  Vitals with BMI  Systolic 128 122 454  Diastolic 70 81 82  Pulse 95 95 100     1. General:  in No  Acute distress    Chronically ill   -appearing 2. Psychological: Alert and   Oriented 3. Head/ENT:    Dry Mucous Membranes                          Head Non traumatic, neck supple                           Poor Dentition 4. SKIN:  decreased Skin turgor,  Skin clean Dry and intact no rash 5. Heart: Regular rate and rhythm no  Murmur, no Rub or gallop 6. Lungs: Clear to auscultation bilaterally, no wheezes or crackles   7. Abdomen: Soft,  non-tender, Non distended  bowel sounds present 8.  Lower extremities: no clubbing, cyanosis, no  edema 9. Neurologically  strength 4 out of 5 in all 4 extremities cranial nerves II through XII intact, reports decreased sensation in the LE up to the knees And across the abd 10. MSK: Normal range of motion    Chart has been reviewed  ______________________________________________________________________________________________  Assessment/Plan 21 y.o. 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  DVT prophylaxis:  SCD     Code Status:    Code Status: Prior FULL CODE as per patient   I had personally discussed CODE STATUS with patient     Family Communication:   Family   at  Bedside  plan of care was discussed   with   mother  Disposition Plan:         To home once workup is complete and patient is stable   Following barriers for discharge:  Anemia stable                                                      Will need consultants to evaluate patient prior to discharge                      Would benefit from PT/OT eval prior to DC  Ordered                   Swallow eval - SLP ordered                     Consults called: neurology   Admission status:  ED Disposition     ED Disposition  Admit   Condition  --   Comment  Hospital Area: [facility] [[id number]]  Level of Care: Progressive [102]  Admit to Progressive  based on following criteria: NEUROLOGICAL AND NEUROSURGICAL complex patients with significant risk of instability, who do not meet ICU criteria, yet require close observation or frequent assessment (< / = every 2 - 4 hours) with medical / nursing intervention.  May admit patient to [facility] or [facility] if equivalent level of care is available:: No  Covid Evaluation: Asymptomatic - no recent exposure (last 10 days) testing not required  Diagnosis: Marveen Reeks [facility]) [[id number]]  Admitting Physician: Therisa Doyne [[id number]]  Attending Physician: Therisa Doyne [[id number]]  Certification:: I certify this patient will need inpatient services for at least 2 midnights  Estimated Length of Stay: 2         inpatient     I Expect 2 midnight stay secondary to severity of patient's current illness need for inpatient interventions justified by the following:  Severe lab/radiological/exam abnormalities including:    Possible GBS and extensive comorbidities including: Recent surgical intervention  That are currently affecting medical management.   I expect  patient to be hospitalized for 2 midnights requiring inpatient medical care.  Patient is at high risk for adverse outcome (such as loss of life or disability) if not treated.  Indication for inpatient stay as follows:   Need for plasmaphoeresis Need for IVIG    Level of care  progressive tele indefinitely please discontinue once patient no longer qualifies COVID-19 Labs      01/29/2022, 1:26 AM    [facility]     after 2 AM please page floor coverage PA If 7AM-7PM, please contact the day team taking care of the patient using [email or web address]   Patient was evaluated in the context of the global COVID-19 pandemic, which necessitated consideration that the patient might be at risk for infection with the SARS-CoV-2 virus that causes COVID-19. Institutional protocols and algorithms that pertain to the  evaluation of patients at risk for COVID-19 are in a state of rapid change based on information released by regulatory bodies including the CDC and federal and state organizations. These policies and algorithms were followed during the patient's care.

## 2022-01-29 NOTE — Assessment & Plan Note (Signed)
Will order Anemia panel in AM

## 2022-01-29 NOTE — Assessment & Plan Note (Addendum)
Appreciate neurology consult LP labs ordered Imaging as per neurology Order neuro checks

## 2022-01-29 NOTE — Telephone Encounter (Signed)
Noted, metal in her hip should be compatible with MRI but for now, she should be in the hospital for evaluation and treatment for Guillain-Barr.

## 2022-01-29 NOTE — Telephone Encounter (Signed)
FYI: Pt's PA, Alvester Morin, PA Reviewed last office note and saw you were planning MRI of the brain and C spine. Just wanted to make sure that you knew about the metal in her body from the hip replacement.

## 2022-01-29 NOTE — ED Notes (Signed)
Pt in room with family eating and talking. Reports pain as tolerable at this time. Pt given water. Denies any other needs at this time. Family updated on plan of care.

## 2022-01-29 NOTE — Plan of Care (Signed)

## 2022-01-29 NOTE — Subjective & Objective (Signed)
Started to have numbness in her hands and feet, progressed to not moving toes, increased falls and weakness, weakness of eyelids Now no sensation in lower extremities  This started after having hip surgery for acquired hip dysplasia Neurology seen in office and diagnosed with GBS she was sent in to ER for plasmapheresis, LP and IVIG

## 2022-01-29 NOTE — Progress Notes (Signed)
PROGRESS NOTE    Savannah Alvarez  WUJ:[id number] DOB: 09/01/2000 DOA: 01/28/2022 PCP: Remo LippsWaddell, Daniel P, PA    Brief Narrative:   Savannah Alvarez is a 21 y.o. 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  Patient reports on October 30, she woke up with a tingling sensation in bilateral hands and feet which progressively worsened to all extremities.  She reports 7 falls since mid November and difficulty holding things in her hands such as utensils.  She was seen by neurology outpatient, Dr. Terrace ArabiaYan and had EMG-NCS performed which confirmed acute demyelinating polyradiculoneuropathy, mild axonal component and she was directed to the ED for further evaluation and treatment with IVIG versus Plex and LP.  In the ED, temperature 98.1 F, HR 100, RR 16, BP 130/82, SpO2 100% on room air.  WBC 5.3, hemoglobin 10.7, platelets 457.  Sodium 134, potassium 3.4, chloride 1 3, CO2 22, glucose 85, BUN 17, creatinine 0.65.  hCG negative.  TSH 1.383.  RPR nonreactive.  Urinalysis unrevealing.  Neurology was consulted.  Patient underwent lumbar puncture.  TRH consulted for for admission for further evaluation management of AIDP/Guillain-Barr syndrome.  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  Patient has had been having progressive weakness/tingling of her extremities since October 30.  Patient underwent lumbar puncture on admission with WBC count 2, total protein 45, glucose 60.  Meningitis viral PCR panel negative.  RPR/HIV nonreactive. -- Neurology following, appreciate assistance -- CSF culture:  Gram stain with no organisms, further pending -- Oligoclonal bands: Pending -- CSF IgG: Pending -- CSF VRDL: Pending -- SPEP: Pending -- Lyme's disease titer: Pending -- Vitamin B6: Pending -- Started on IVIG daily x 5 days  Hypokalemia Hypomagnesemia Potassium 3.3, magnesium 1.5, will replete. -- Repeat electrolytes in a.m.  Right chronic hip pain/acetabular dysplasia Patient recently underwent right periacetabular osteotomy by orthopedic surgery at [facility] on 12/15/2021 by Dr. Rolanda JayAlejandro Marquez-Lara. -- Continue outpatient follow-up with orthopedics.  DVT prophylaxis: SCDs Start: 01/29/22 [id number]    Code Status: Full Code Family Communication: Updated mother present at bedside this morning  Disposition Plan:  Level of care: Progressive Status is: Inpatient Remains inpatient appropriate because: On IVIG x 5 days    Consultants:  Neurology  Procedures:  Lumbar puncture  Antimicrobials:  None   Subjective: Patient seen examined bedside, resting comfortably.  Continues with paresthesias and weakness to her extremities.  Just started on IVIG this morning.  Mother updated present at bedside.  No other specific complaints or concerns or questions at this point.  Denies headache, no fever/chills/night sweats, no nausea/vomiting/diarrhea, no chest pain, no abdominal pain, no shortness of breath, no cough/congestion.  No acute events overnight per nursing staff.  Objective: Vitals:   01/29/22 1000 01/29/22 1030 01/29/22 1100 01/29/22 1130  BP: 116/74 117/71 117/70 122/74  Pulse: 88 88 93 92  Resp: 10 13 (!) 21 14  Temp:    98.3 F (36.8 C)  TempSrc:    Oral  SpO2: 100% 100% 100% 100%  Weight:      Height:       No intake or output data in the 24 hours ending 01/29/22 1432 Filed Weights   01/29/22 0130  Weight: 53.3 kg    Examination:  Physical Exam: GEN: NAD, alert and oriented x 3, thin in appearance HEENT: NCAT, PERRL, EOMI, sclera clear, MMM PULM: CTAB w/o  wheezes/crackles, normal respiratory effort, on room air CV: RRR w/o M/G/R GI: abd soft, NTND, NABS, no R/G/M MSK: no peripheral edema, muscle strength left upper/lower extremities and right upper/lower extremities 4+/5 NEURO: CN II-XII intact, decreased sensation to light touch diminished bilateral upper extremities from mid lower arm to hand and lower extremities from mid shin distally PSYCH: normal mood/affect Integumentary: dry/intact, no rashes or wounds    Data Reviewed: I have personally reviewed following labs and imaging studies  CBC: Recent Labs  Lab 01/27/22 1107 01/28/22 1635 01/29/22 0921  WBC 4.0 5.3 2.9*  NEUTROABS 1.1*  --   --   HGB 11.8 10.7* 9.5*  HCT 36.2 34.4* 29.6*  MCV 79 82.1 81.5  PLT 501* 457* 343   Basic Metabolic Panel: Recent Labs  Lab 01/27/22 1107 01/28/22 1635 01/29/22 0044 01/29/22 0921  NA 137 134*  --  136  K 5.2 3.5  --  3.3*  CL 98 103  --  106  CO2 17* 22  --  23  GLUCOSE 85 85  --  98  BUN 15 17  --  13  CREATININE 0.76 0.65  --  0.66  CALCIUM 10.5* 9.9  --  9.5  MG  --   --  1.6* 1.5*  PHOS  --   --  3.3 3.8   GFR: Estimated Creatinine Clearance: 89.5 mL/min (by C-G formula based on SCr of 0.66 mg/dL). Liver Function Tests: Recent Labs  Lab 01/27/22 1107 01/29/22 0044 01/29/22 0921  AST 38 23 21  ALT 21 17 17   ALKPHOS 85 56 50  BILITOT 0.7 0.4 0.6  PROT 8.6* 6.8 7.3  ALBUMIN 4.9 3.5 3.4*   No results for input(s): "LIPASE", "AMYLASE" in the last 168 hours. No results for input(s): "AMMONIA" in the last 168 hours. Coagulation Profile: No results for input(s): "INR", "PROTIME" in the last 168 hours. Cardiac Enzymes: Recent Labs  Lab 01/27/22 1107 01/29/22 0044  CKTOTAL 188* 106   BNP (last 3 results) No results for input(s): "PROBNP" in the last 8760 hours. HbA1C: Recent Labs    01/27/22 1107  HGBA1C 5.1   CBG: No results for input(s): "GLUCAP" in the last 168 hours. Lipid Profile: No results for  input(s): "CHOL", "HDL", "LDLCALC", "TRIG", "CHOLHDL", "LDLDIRECT" in the last 72 hours. Thyroid Function Tests: Recent Labs    01/29/22 0044  TSH 1.383   Anemia Panel: Recent Labs    01/29/22 0044  VITAMINB12 363  FOLATE 12.3  FERRITIN 22  TIBC 403  IRON 22*  RETICCTPCT 1.1   Sepsis Labs: No results for input(s): "PROCALCITON", "LATICACIDVEN" in the last 168 hours.  Recent Results (from the past 240 hour(s))  CSF culture w Gram Stain     Status: None (Preliminary result)   Collection Time: 01/29/22  1:27 AM   Specimen: CSF; Cerebrospinal Fluid  Result Value Ref Range Status   Specimen Description CSF  Final   Special Requests LP  Final   Gram Stain   Final    NO WBC SEEN NO ORGANISMS SEEN Performed at [facility], [address], [place], [place]    Culture PENDING  Incomplete   Report Status PENDING  Incomplete         Radiology Studies: NCV with EMG(electromyography)  Result Date: 01/28/2022  Levert Feinstein, MD     01/28/2022  2:34 PM     Full Name: Aariona Momon Gender: Female MRN #: [id number] Date of Birth: [birth year]   Visit Date: 01/28/2022 12:48 Age: 68 Years Examining Physician: Dr. Levert Feinstein Referring Physician: Dr. Frances Furbish Height: 5 feet 2 inch History: 21 year old female status post right periacetabular osteotomy on December 15, 2021, developed acute onset of bilateral upper and lower extremity numbness and weakness a week after, reached nadir about 4 weeks after symptoms onset, not still have persistent distal weakness numbness, fell multiple times Summary of the Test: Nerve conduction study: Right sural, superficial peroneal, ulnar, median sensory responses were absent. Right tibial, peroneal to EDB and ulnar motor responses were absent. Electromyography: Selected needle examination of right upper, lower extremity muscles, right cervical and lumbosacral paraspinal muscles; right facial muscle performed. There is evidence of increased insertional activity,  mild spontaneous activity at distal upper and lower extremity muscles, with complex large motor unit potential decreased recruitment patterns. There is also evidence of neuropathic changes involving right orbicularis oculi and frontalis muscles. There is also evidence of increased insertional activity, polyphasic complex motor unit potential at right lumbosacral paraspinals, suggestive of remodeling process. Conclusion: This is an abnormal study.  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 ------------------------------- Levert Feinstein M.D. PhD [facility] [address], [address] [place], [place] [address] Tel: [phone] Fax: [phone] Verbal informed consent was obtained from the patient, patient was informed of potential risk of procedure, including bruising, bleeding, hematoma formation, infection, muscle weakness, muscle pain, numbness, among others.     MNC   Nerve / Sites Muscle Latency Ref. Amplitude Ref. Rel Amp Segments Distance Ref. Area   ms ms mV mV %  cm m/s mVms R Ulnar - ADM    Wrist ADM 21.9 ?3.3 0.1 ?6.0 100 Wrist - ADM 7      B.Elbow ADM 21.5  1.4  1825 B.Elbow - Wrist  ?49     A.Elbow ADM      A.Elbow - B.Elbow  ?49  R Peroneal - EDB    Ankle EDB NR ?6.5 NR ?2.0 NR Ankle - EDB 9  NR        Pop fossa - Ankle    R Tibial - AH    Ankle AH NR ?5.8 NR ?4.0 NR Ankle - AH 9  NR           SNC   Nerve / Sites Rec. Site Peak Lat Ref.  Amp Ref. Segments Distance   ms ms V V  cm R Radial - Anatomical snuff box (Forearm)    Forearm Wrist 2.7 ?2.9 5 ?15 Forearm - Wrist 10 R Sural - Ankle (Calf)    Calf Ankle NR ?4.4 NR ?6 Calf - Ankle 14 R Superficial peroneal - Ankle    Lat leg Ankle NR ?4.4 NR ?6 Lat leg - Ankle 14 R Median - Orthodromic (Dig II, Mid palm)    Dig II Wrist NR ?3.4 NR ?10 Dig II - Wrist 13 R Ulnar - Orthodromic, (Dig V, Mid palm)    Dig V Wrist NR ?3.1 NR ?5 Dig V - Wrist 11              EMG Summary Table   Spontaneous MUAP Recruitment Muscle IA Fib PSW Fasc Other Amp Dur. Poly Pattern R. Tibialis anterior Increased 1+ None None _______ Increased Increased 1+ Reduced R. Tibialis posterior  Increased 1+ None None _______ Increased Increased 1+ Reduced R. Peroneus longus Increased None None None _______ Increased Increased 1+ Reduced R. Gastrocnemius (Medial head) Increased None None None _______ Normal Normal Normal Reduced R. Vastus lateralis Normal None None None _______ Normal Normal Normal Reduced R. Lumbar paraspinals (low) Normal None None None _______ Normal Normal Normal Normal R. Lumbar paraspinals (mid) Normal None None None _______ Normal Normal Normal Normal R. First dorsal interosseous Increased 1+ None None _______ Increased Increased 1+ Reduced R. Abductor digiti minimi (manus) Increased 1+ None None _______ Increased Increased 1+ Reduced R. Brachioradialis Increased None None None _______ Normal Normal Normal Reduced R. Biceps brachii Normal None None None _______ Normal Normal Normal Reduced R. Deltoid Normal None None None _______ Normal Normal Normal Reduced R. Triceps brachii Normal None None None _______ Normal Normal Normal Reduced R. Cervical paraspinals Normal None None None _______ Normal Normal Normal Normal R. Frontalis Increased None None None _______ Increased Increased 1+ Reduced R. Orbicularis oculi Increased None None None _______ Increased Increased 1+ Reduced         Scheduled Meds: Continuous Infusions:  sodium chloride 75 mL/hr at 01/29/22 0953   Immune Globulin 10% Stopped (01/29/22 0919)   sodium chloride 1,000 mL (01/29/22 0629)     LOS: 0 days    Time spent: 51 minutes spent on chart review, discussion with nursing staff, consultants, updating family and interview/physical exam; more than 50% of that time was spent in counseling and/or coordination of care.    Alvira Philips [place], DO [facility] Available via Epic secure chat  7am-7pm After these hours, please refer to coverage provider listed on [email or web address] 01/29/2022, 2:32 PM

## 2022-01-30 LAB — MAGNESIUM: Magnesium: 1.7 mg/dL (ref 1.7–2.4)

## 2022-01-30 LAB — BASIC METABOLIC PANEL
Anion gap: 7 (ref 5–15)
BUN: 10 mg/dL (ref 6–20)
CO2: 24 mmol/L (ref 22–32)
Calcium: 9 mg/dL (ref 8.9–10.3)
Chloride: 106 mmol/L (ref 98–111)
Creatinine, Ser: 0.61 mg/dL (ref 0.44–1.00)
GFR, Estimated: >60 mL/min (ref >60)
Glucose, Bld: 91 mg/dL (ref 70–99)
Potassium: 3.4 mmol/L — ABNORMAL LOW (ref 3.5–5.1)
Sodium: 137 mmol/L (ref 135–145)

## 2022-01-30 MED ORDER — MAGNESIUM SULFATE 2 GM/50ML IV SOLN
2.0000 g | Freq: Once | INTRAVENOUS | Status: AC
  Administered 2022-01-30: 2 g via INTRAVENOUS
  Filled 2022-01-30: qty 50

## 2022-01-30 MED ORDER — ONDANSETRON HCL 4 MG/2ML IJ SOLN
4.0000 mg | Freq: Four times a day (QID) | INTRAMUSCULAR | Status: DC | PRN
  Administered 2022-01-30 – 2022-01-31 (×4): 4 mg via INTRAVENOUS
  Filled 2022-01-30 (×4): qty 2

## 2022-01-30 MED ORDER — ADULT MULTIVITAMIN W/MINERALS CH
1.0000 | ORAL_TABLET | Freq: Every day | ORAL | Status: DC
  Administered 2022-01-31 – 2022-02-02 (×3): 1 via ORAL
  Filled 2022-01-30 (×3): qty 1

## 2022-01-30 MED ORDER — SUMATRIPTAN SUCCINATE 100 MG PO TABS
100.0000 mg | ORAL_TABLET | ORAL | Status: DC | PRN
  Administered 2022-01-30: 100 mg via ORAL
  Filled 2022-01-30 (×2): qty 1

## 2022-01-30 MED ORDER — SERTRALINE HCL 50 MG PO TABS
50.0000 mg | ORAL_TABLET | Freq: Every day | ORAL | Status: DC
  Administered 2022-01-31 – 2022-02-02 (×3): 50 mg via ORAL
  Filled 2022-01-30 (×3): qty 1

## 2022-01-30 MED ORDER — RIZATRIPTAN BENZOATE 5 MG PO TBDP: 10.0000 mg | ORAL_TABLET | ORAL | Status: DC | PRN

## 2022-01-30 MED ORDER — DIPHENHYDRAMINE HCL 25 MG PO CAPS
50.0000 mg | ORAL_CAPSULE | Freq: Once | ORAL | Status: AC
  Administered 2022-01-30: 50 mg via ORAL
  Filled 2022-01-30: qty 2

## 2022-01-30 MED ORDER — POTASSIUM CHLORIDE CRYS ER 20 MEQ PO TBCR
30.0000 meq | EXTENDED_RELEASE_TABLET | ORAL | Status: AC
  Administered 2022-01-30 (×2): 30 meq via ORAL
  Filled 2022-01-30 (×2): qty 1

## 2022-01-30 MED ORDER — DIPHENHYDRAMINE HCL 25 MG PO CAPS
50.0000 mg | ORAL_CAPSULE | Freq: Once | ORAL | Status: AC
  Administered 2022-01-31: 50 mg via ORAL
  Filled 2022-01-30: qty 2

## 2022-01-30 MED ORDER — ENSURE ENLIVE PO LIQD
237.0000 mL | Freq: Two times a day (BID) | ORAL | Status: DC
  Administered 2022-01-31: 237 mL via ORAL

## 2022-01-30 NOTE — Progress Notes (Signed)
PROGRESS NOTE    Savannah Alvarez  [id number]:[id number] DOB: Jul 03, 2000 DOA: 01/28/2022 PCP: Remo Lipps, PA    Brief Narrative:   Savannah Alvarez is a 21 y.o. 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  Patient reports on October 30, she woke up with a tingling sensation in bilateral hands and feet which progressively worsened to all extremities.  She reports 7 falls since mid November and difficulty holding things in her hands such as utensils.  She was seen by neurology outpatient, Dr. Terrace Arabia and had EMG-NCS performed which confirmed acute demyelinating polyradiculoneuropathy, mild axonal component and she was directed to the ED for further evaluation and treatment with IVIG versus Plex and LP.  In the ED, temperature 98.1 F, HR 100, RR 16, BP 130/82, SpO2 100% on room air.  WBC 5.3, hemoglobin 10.7, platelets 457.  Sodium 134, potassium 3.4, chloride 1 3, CO2 22, glucose 85, BUN 17, creatinine 0.65.  hCG negative.  TSH 1.383.  RPR nonreactive.  Urinalysis unrevealing.  Neurology was consulted.  Patient underwent lumbar puncture.  TRH consulted for for admission for further evaluation management of AIDP/Guillain-Barr syndrome.  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  Patient has had been having progressive weakness/tingling of her extremities since October 30.  Patient underwent lumbar puncture on admission with WBC count 2, total protein 45, glucose 60.  Meningitis viral PCR panel negative.  RPR/HIV nonreactive. -- Neurology following, appreciate assistance -- CSF culture:  Gram stain with no organisms, no growth x 1 day -- Oligoclonal bands: Pending -- CSF IgG: Pending -- CSF VRDL: Pending -- SPEP: Pending -- Lyme's disease titer: Pending -- Vitamin B6: Pending -- Started on IVIG daily x 5 days; day #2/5  Hypokalemia Hypomagnesemia Potassium 3.4, magnesium 1.7, will replete. -- Repeat electrolytes in a.m.  Right chronic hip pain/acetabular dysplasia Patient recently underwent right periacetabular osteotomy by orthopedic surgery at [facility] on 12/15/2021 by Dr. Rolanda Jay. -- Continue outpatient follow-up with orthopedics.  DVT prophylaxis: SCDs Start: 01/29/22 8341    Code Status: Full Code Family Communication: Updated mother present at bedside this morning  Disposition Plan:  Level of care: Progressive Status is: Inpatient Remains inpatient appropriate because: On IVIG x 5 days    Consultants:  Neurology  Procedures:  Lumbar puncture  Antimicrobials:  None   Subjective: Patient seen examined bedside, resting comfortably.  Sleeping but arousable.  Has some increased sensation of her extremities but continues with weakness.  Receiving second dose of IVIG this morning.  Mother updated at bedside.  No other specific questions or concerns at this time. Denies headache, no fever/chills/night sweats, no nausea/vomiting/diarrhea, no chest pain, no abdominal pain, no shortness of breath, no cough/congestion.  No acute events overnight per nursing staff.  Objective: Vitals:   01/30/22 1031 01/30/22 1045 01/30/22 1101 01/30/22 1132  BP: 132/76 132/79 133/83 130/84  Pulse: 87 (!) 103 94 93  Resp:      Temp:    98.2 F (36.8 C)  TempSrc:    Oral  SpO2: 100% 100% 100% 100%  Weight:      Height:        Intake/Output Summary (Last 24 hours) at 01/30/2022 1326  Last data filed at 01/30/2022 0000 Gross per 24 hour  Intake 2547.95 ml  Output --  Net 2547.95 ml   Filed Weights   01/29/22 0130  Weight: 53.3 kg     Examination:  Physical Exam: GEN: NAD, alert and oriented x 3, thin in appearance HEENT: NCAT, PERRL, EOMI, sclera clear, MMM PULM: CTAB w/o wheezes/crackles, normal respiratory effort, on room air CV: RRR w/o M/G/R GI: abd soft, NTND, NABS, no R/G/M MSK: no peripheral edema, muscle strength left upper/lower extremities and right upper/lower extremities 4+/5 NEURO: CN II-XII intact, decreased sensation to light touch diminished bilateral upper extremities from mid lower arm to hand and lower extremities from mid shin distally PSYCH: normal mood/affect Integumentary: dry/intact, no rashes or wounds    Data Reviewed: I have personally reviewed following labs and imaging studies  CBC: Recent Labs  Lab 01/27/22 1107 01/28/22 1635 01/29/22 0921  WBC 4.0 5.3 2.9*  NEUTROABS 1.1*  --   --   HGB 11.8 10.7* 9.5*  HCT 36.2 34.4* 29.6*  MCV 79 82.1 81.5  PLT 501* 457* 343   Basic Metabolic Panel: Recent Labs  Lab 01/27/22 1107 01/28/22 1635 01/29/22 0044 01/29/22 0921 01/30/22 0417  NA 137 134*  --  136 137  K 5.2 3.5  --  3.3* 3.4*  CL 98 103  --  106 106  CO2 17* 22  --  23 24  GLUCOSE 85 85  --  98 91  BUN 15 17  --  13 10  CREATININE 0.76 0.65  --  0.66 0.61  CALCIUM 10.5* 9.9  --  9.5 9.0  MG  --   --  1.6* 1.5* 1.7  PHOS  --   --  3.3 3.8  --    GFR: Estimated Creatinine Clearance: 89.5 mL/min (by C-G formula based on SCr of 0.61 mg/dL). Liver Function Tests: Recent Labs  Lab 01/27/22 1107 01/29/22 0044 01/29/22 0921  AST 38 23 21  ALT 21 17 17   ALKPHOS 85 56 50  BILITOT 0.7 0.4 0.6  PROT 8.6* 6.8 7.3  ALBUMIN 4.9 3.5 3.4*   No results for input(s): "LIPASE", "AMYLASE" in the last 168 hours. No results for input(s): "AMMONIA" in the last 168 hours. Coagulation Profile: No results for input(s): "INR", "PROTIME" in the last 168 hours. Cardiac Enzymes: Recent Labs  Lab 01/27/22 1107 01/29/22 0044  CKTOTAL 188* 106   BNP (last 3 results) No  results for input(s): "PROBNP" in the last 8760 hours. HbA1C: No results for input(s): "HGBA1C" in the last 72 hours.  CBG: No results for input(s): "GLUCAP" in the last 168 hours. Lipid Profile: No results for input(s): "CHOL", "HDL", "LDLCALC", "TRIG", "CHOLHDL", "LDLDIRECT" in the last 72 hours. Thyroid Function Tests: Recent Labs    01/29/22 0044  TSH 1.383   Anemia Panel: Recent Labs    01/29/22 0044  VITAMINB12 363  FOLATE 12.3  FERRITIN 22  TIBC 403  IRON 22*  RETICCTPCT 1.1   Sepsis Labs: No results for input(s): "PROCALCITON", "LATICACIDVEN" in the last 168 hours.  Recent Results (from the past 240 hour(s))  CSF culture w Gram Stain     Status: None (Preliminary result)   Collection Time: 01/29/22  1:27 AM   Specimen: CSF; Cerebrospinal Fluid  Result Value Ref Range Status   Specimen Description CSF  Final   Special Requests LP  Final   Gram Stain NO WBC SEEN NO ORGANISMS SEEN   Final   Culture  Final    NO GROWTH 1 DAY Performed at [facility], [address] [place], [place] [address]    Report Status PENDING  Incomplete         Radiology Studies: No results found.      Scheduled Meds:  potassium chloride  30 mEq Oral Q3H   Continuous Infusions:  Immune Globulin 10% Stopped (01/30/22 1117)   sodium chloride 1,000 mL (01/30/22 1122)     LOS: 1 day    Time spent: 51 minutes spent on chart review, discussion with nursing staff, consultants, updating family and interview/physical exam; more than 50% of that time was spent in counseling and/or coordination of care.    Alvira Philips [place], DO [facility] Available via Epic secure chat 7am-7pm After these hours, please refer to coverage provider listed on [email or web address] 01/30/2022, 1:26 PM

## 2022-01-30 NOTE — TOC Initial Note (Signed)
Transition of Care [facility]) - Initial/Assessment Note    Patient Details  Name: Savannah Alvarez MRN: [id number] Date of Birth: [birth year]  Transition of Care [facility]) CM/SW Contact:    Kermit Balo, RN Phone Number: 01/30/2022, 3:24 PM  Clinical Narrative:                 Pt is from home with her mother. She states her mother has her own business so she can provide support and supervision for the patient at home.  She denies issues with home medications and states her mother over sees them.  Mom does the needed transportation. Pt on 2nd dose of IVIG.  Current recommendations are for outpatient PT. CM will arrange this closer to d/c.  TOC following.  Expected Discharge Plan: OP Rehab Barriers to Discharge: Continued Medical Work up   Patient Goals and CMS Choice     Choice offered to / list presented to : Patient  Expected Discharge Plan and Services Expected Discharge Plan: OP Rehab   Discharge Planning Services: CM Consult   Living arrangements for the past 2 months: Single Family Home                                      Prior Living Arrangements/Services Living arrangements for the past 2 months: Single Family Home Lives with:: Parents Patient language and need for interpreter reviewed:: Yes Do you feel safe going back to the place where you live?: Yes      Need for Family Participation in Patient Care: Yes (Comment) Care giver support system in place?: Yes (comment) Current home services: DME (walker and shower seat) Criminal Activity/Legal Involvement Pertinent to Current Situation/Hospitalization: No - Comment as needed  Activities of Daily Living Home Assistive Devices/Equipment: Environmental consultant (specify type), Wheelchair, Shower chair without back, Crutches (2 wheel walker) ADL Screening (condition at time of admission) Patient's cognitive ability adequate to safely complete daily activities?: Yes Is the patient deaf or have difficulty hearing?: No Does the patient  have difficulty seeing, even when wearing glasses/contacts?: No Does the patient have difficulty concentrating, remembering, or making decisions?: Yes Patient able to express need for assistance with ADLs?: Yes Does the patient have difficulty dressing or bathing?: Yes Independently performs ADLs?: No Communication: Independent Dressing (OT): Needs assistance Grooming: Needs assistance Is this a change from baseline?: Pre-admission baseline Feeding: Independent Bathing: Needs assistance Is this a change from baseline?: Pre-admission baseline Toileting: Needs assistance Is this a change from baseline?: Pre-admission baseline In/Out Bed: Independent Walks in Home: Independent with device (comment) (2 wheel walker.) Does the patient have difficulty walking or climbing stairs?: Yes Weakness of Legs: Both Weakness of Arms/Hands: Both  Permission Sought/Granted                  Emotional Assessment Appearance:: Appears stated age Attitude/Demeanor/Rapport: Engaged Affect (typically observed): Accepting Orientation: : Oriented to Self, Oriented to Place, Oriented to  Time, Oriented to Situation   Psych Involvement: No (comment)  Admission diagnosis:  Guillain Barr syndrome (HCC) [G61.0] 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 (HCC) [G61.0] Patient Active Problem List   Diagnosis Date Noted   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

## 2022-01-30 NOTE — Progress Notes (Signed)
Initial Nutrition Assessment  DOCUMENTATION CODES:   Severe malnutrition in context of acute illness/injury  INTERVENTION:   - Ensure Enlive po BID, each supplement provides 350 kcal and 20 grams of protein  - Magic Cup BID with meals, each supplement provides 290 kcal and 9 grams of protein  - MVI with minerals daily  - Encourage PO intake  NUTRITION DIAGNOSIS:   Severe Malnutrition related to acute illness (GBS) as evidenced by mild fat depletion, moderate muscle depletion, percent weight loss (12.3% weight loss in < 5 months).  GOAL:   Patient will meet greater than or equal to 90% of their needs  MONITOR:   PO intake, Supplement acceptance, Labs, Weight trends  REASON FOR ASSESSMENT:   Consult Assessment of nutrition requirement/status  ASSESSMENT:   21 year old female who presented to the ED on 12/06 with numbness. 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 Pt admitted for IVIG.  Spoke with pt at bedside. Pt states that today she has eaten some soup and some mac and cheese from [employer]. She reports that her appetite is slowly improving. Pt reports decrease in appetite that began at the end of October, about 1 week after her hip surgery. Pt reports developing Bell's Palsy which made it difficult for her to chew and swallow her food. Because of this, pt has mostly been eating soft and liquid foods over the last month. Pt's diet has consisted of potato soup, broccoli and cheddar soup, macaroni and cheese, and similar foods. She has been drinking mostly water but has also been consuming juice.  Pt endorses weight loss over this timeframe. She states that she used to weigh 134 lbs and now weighs 118 lbs. Reviewed weight history in chart. Pt with a 7.5 kg weight loss since 09/01/21. This is a 12.3% weight loss in less than 5 months which is severe and significant for timeframe. Pt meets criteria for severe  acute malnutrition based on weight loss and NFPE.  Pt willing to try oral nutrition supplements during admission to increase kcal and protein intake. Pt wants to try both Ensure Enlive and Magic Cups to see which she prefers. RD will also order daily MVI with minerals. Noted several vitamin/mineral labs have been drawn; see below.  Medications reviewed and include: klor-con 30 mEq x 2, IV magnesium sulfate 2 grams x 1, IVIG  Labs reviewed: potassium 3.4, magnesium 1.7  Vitamin/Mineral Profile: Thiamine B1: pending Vitamin B6: pending Vitamin B12: 363 (WNL) Folate B9: 12.3 (WNL) Vitamin D: 46.1 (WNL) CRP: <1 (WNL)  Labs reviewed: WBC 2.9, hemoglobin 9.5  NUTRITION - FOCUSED PHYSICAL EXAM:  Flowsheet Row Most Recent Value  Orbital Region Mild depletion  Upper Arm Region Mild depletion  Thoracic and Lumbar Region Mild depletion  Buccal Region No depletion  Temple Region No depletion  Clavicle Bone Region Moderate depletion  Clavicle and Acromion Bone Region Moderate depletion  Scapular Bone Region Mild depletion  Dorsal Hand Mild depletion  Patellar Region Moderate depletion  Anterior Thigh Region Moderate depletion  Posterior Calf Region Moderate depletion  Edema (RD Assessment) None  Hair Reviewed  Eyes Reviewed  Mouth Reviewed  Skin Reviewed  Nails Reviewed       Diet Order:   Diet Order             Diet regular Room service appropriate? Yes; Fluid consistency: Thin  Diet effective now  EDUCATION NEEDS:   Education needs have been addressed  Skin:  Skin Assessment: Reviewed RN Assessment  Last BM:  01/29/22  Height:   Ht Readings from Last 1 Encounters:  01/29/22 _0  (1.575 m) (18 %, Z= -0.90)*   * Growth percentiles are based on CDC (Girls, 2-20 Years) data.    Weight:   Wt Readings from Last 1 Encounters:  01/29/22 53.3 kg (29 %, Z= -0.56)*   * Growth percentiles are based on CDC (Girls, 2-20 Years) data.    BMI:   Body mass index is 21.49 kg/m.  Estimated Nutritional Needs:   Kcal:  1800-2000  Protein:  80-95 grams  Fluid:  >1.8 L    Gustavus Bryant, MS, RD, LDN Inpatient Clinical Dietitian Please see [facility] for contact information.

## 2022-01-30 NOTE — Evaluation (Signed)
Physical Therapy Evaluation Patient Details Name: Savannah Alvarez MRN: [id number] DOB: [birth year] Today's Date: 01/30/2022  History of Present Illness  Savannah Alvarez is a 21 y.o. 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  Clinical Impression  Patient presents with decreased mobility due to generalized weakness, decreased sensation, decreased balance with poor core stability and mildly ataxic gait and high fall risk with 7 falls in 2 months.  Patient unsure of current precautions with her hip and weight bearing, but reports walking at home and community (with assistance of her mom in community).  Feel she will benefit from skilled PT in the acute setting and from follow up outpatient PT at d/c.  She is supposed to start therapy for her hip and has follow up visit with MD on the 13th.  Unsure if she can coordinate care between her therapy for her hip and for her neurological issues.  PT will follow along acutely.      Recommendations for follow up therapy are one component of a multi-disciplinary discharge planning process, led by the attending physician.  Recommendations may be updated based on patient status, additional functional criteria and insurance authorization.  Follow Up Recommendations Outpatient PT      Assistance Recommended at Discharge Intermittent Supervision/Assistance  Patient can return home with the following  Help with stairs or ramp for entrance;Assistance with cooking/housework;Assist for transportation    Equipment Recommendations None recommended by PT  Recommendations for Other Services       Functional Status Assessment Patient has had a recent decline in their functional status and demonstrates the ability to make significant improvements in  function in a reasonable and predictable amount of time.     Precautions / Restrictions Precautions Precautions: Fall Precaution Comments: unsure of hip precautions and weight bearing limitations from hip surgery in Oct.  Attempted to contact [facility] MD office without success.  Brianda mentions limiting to 90 degrees flexion and was to be NWB for 6wks from surgery      Mobility  Bed Mobility Overal bed mobility: Modified Independent                  Transfers Overall transfer level: Needs assistance Equipment used: Rolling walker (2 wheels) Transfers: Sit to/from Stand Sit to Stand: Supervision           General transfer comment: using walker to stand    Ambulation/Gait Ambulation/Gait assistance: Supervision, Min guard Gait Distance (Feet): 90 Feet (x 2 with seated rest) Assistive device: Rolling walker (2 wheels) Gait Pattern/deviations: Step-to pattern, Step-through pattern, Decreased stride length, Decreased dorsiflexion - right, Decreased dorsiflexion - left, Wide base of support, Ataxic       General Gait Details: limited core stability with heavy UE reliance HR to 128 with ambulation and noted increased difficulty controlling R foot with fatigue and increased genu recurvatum  Stairs Stairs: Yes Stairs assistance: Min assist Stair Management: Two rails, Step to pattern, Forwards Number of Stairs: 2 General stair comments: LOB stepping off last step posteriorly with min A recovery  Wheelchair Mobility    Modified Rankin (Stroke Patients Only)       Balance Overall balance assessment: Needs assistance   Sitting balance-Leahy Scale: Good     Standing balance support: Reliant on assistive device for balance Standing balance-Leahy Scale: Poor  Pertinent Vitals/Pain Pain Assessment Faces Pain Scale: Hurts a little bit Pain Location: R hip with lifting Pain Descriptors / Indicators: Discomfort Pain  Intervention(s): Monitored during session, Premedicated before session    [place] expects to be discharged to:: Private residence Living Arrangements: Parent Available Help at Discharge: Family;Available PRN/intermittently Type of Home: House Home Access: Stairs to enter Entrance Stairs-Rails: Right Entrance Stairs-Number of Steps: 6   Home Layout: One level Home Equipment: Conservation officer, nature (2 wheels);Shower seat;Wheelchair - manual;BSC/3in1;Crutches Additional Comments: 7 falls since the surgery, had to try to be NWB after the surgery on crutches first then with walker, but UE's got weaker in third week so had to put weight, supposed to see MD on 13th, but right now not sure if PWB or WBAT    Prior Function Prior Level of Function : Needs assist             Mobility Comments: needing help with walker when outside the house; mother owns her own alteration shop and pt has been going to her shop with her after the initial falls. ADLs Comments: using seat in shower and has help for shower transfers     Hand Dominance   Dominant Hand: Right    Extremity/Trunk Assessment   Upper Extremity Assessment Upper Extremity Assessment: Defer to OT evaluation    Lower Extremity Assessment Lower Extremity Assessment: RLE deficits/detail;LLE deficits/detail RLE Deficits / Details: AROM WFL within 90 degree limitations, hip flexion 3-/5, knee extension 4-/5, ankle DF 3+/5, PF 3/5 RLE Sensation: decreased light touch (decreased sensatino to knees) LLE Deficits / Details: AROM WFL within 90 degree limitations, hip flexion 3/5, knee extension 4-/5, ankle DF 3+/5, PF 3/5 LLE Sensation: decreased light touch (to knees)    Cervical / Trunk Assessment Cervical / Trunk Assessment: Other exceptions Cervical / Trunk Exceptions: reports numbness in hips and abdomen as well  Communication   Communication: No difficulties  Cognition Arousal/Alertness: Awake/alert Behavior During  Therapy: WFL for tasks assessed/performed Overall Cognitive Status: Within Functional Limits for tasks assessed                                          General Comments General comments (skin integrity, edema, etc.): Gibson's mother present and also unsure of weight bearing status, however, she has been walking in the home and community and follow up x-rays have been satisfactory per her mother    Exercises     Assessment/Plan    PT Assessment Patient needs continued PT services  PT Problem List Decreased strength;Decreased balance;Decreased activity tolerance;Impaired sensation;Decreased knowledge of precautions;Decreased mobility       PT Treatment Interventions DME instruction;Functional mobility training;Balance training;Patient/family education;Therapeutic activities;Gait training;Stair training;Therapeutic exercise    PT Goals (Current goals can be found in the Care Plan section)  Acute Rehab PT Goals Patient Stated Goal: to get stronger PT Goal Formulation: With patient/family Time For Goal Achievement: 02/13/22 Potential to Achieve Goals: Good    Frequency Min 4X/week     Co-evaluation               AM-PAC PT "6 Clicks" Mobility  Outcome Measure Help needed turning from your back to your side while in a flat bed without using bedrails?: None Help needed moving from lying on your back to sitting on the side of a flat bed without using bedrails?: None Help needed moving to and  from a bed to a chair (including a wheelchair)?: A Little Help needed standing up from a chair using your arms (e.g., wheelchair or bedside chair)?: A Little Help needed to walk in hospital room?: A Little Help needed climbing 3-5 steps with a railing? : A Little 6 Click Score: 20    End of Session Equipment Utilized During Treatment: Gait belt Activity Tolerance: Patient limited by fatigue Patient left: with call bell/phone within reach;with family/visitor present    PT Visit Diagnosis: Other abnormalities of gait and mobility (R26.89);Muscle weakness (generalized) (M62.81);Other symptoms and signs involving the nervous system (R29.898)    Time: [id number] PT Time Calculation (min) (ACUTE ONLY): 34 min   Charges:   PT Evaluation $PT Eval Moderate Complexity: 1 Mod PT Treatments $Gait Training: 8-22 mins        Magda Kiel, PT Acute Rehabilitation Services Office:[phone] 01/30/2022   Reginia Naas 01/30/2022, 2:38 PM

## 2022-01-30 NOTE — Plan of Care (Signed)

## 2022-01-30 NOTE — Evaluation (Signed)
Occupational Therapy Evaluation Patient Details Name: Savannah Alvarez MRN: [id number] DOB: 2001/02/06 Today's Date: 01/30/2022   History of Present Illness Savannah Alvarez is a 21 y.o. 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   Clinical Impression   Pt with problem above with deficits listed below. Pt presenting with decreased strength, balance, and sensation. Pt's predominant concern is decreased sensation in her hands and feet. Pt performing UB ADL with set-up and LB ADL with min A due to inability to reach foot within precautions. Pt able to perform various fine motor tasks, however, with complaints on inability to feel objects/surfaces. Supervision during all transfers as pt with balance and gait disturbances. Recommending OP OT to optimize safety and independence in ADL and IADL.      Recommendations for follow up therapy are one component of a multi-disciplinary discharge planning process, led by the attending physician.  Recommendations may be updated based on patient status, additional functional criteria and insurance authorization.   Follow Up Recommendations  Outpatient OT     Assistance Recommended at Discharge Intermittent Supervision/Assistance  Patient can return home with the following A little help with walking and/or transfers;A little help with bathing/dressing/bathroom;Assistance with cooking/housework;Assist for transportation;Help with stairs or ramp for entrance    Functional Status Assessment  Patient has had a recent decline in their functional status and demonstrates the ability to make significant improvements in function in a reasonable and predictable amount of time.  Equipment Recommendations  None recommended by OT    Recommendations for Other  Services       Precautions / Restrictions Precautions Precautions: Fall Precaution Comments: WBAT and implementing hip precautions during session. Restrictions Weight Bearing Restrictions: Yes RLE Weight Bearing: Weight bearing as tolerated Other Position/Activity Restrictions: Has been following posterior hip precautions. Nothing in regard to precautions in orders, but maintained during session for safety.      Mobility Bed Mobility Overal bed mobility: Modified Independent                  Transfers Overall transfer level: Needs assistance Equipment used: Rolling walker (2 wheels) Transfers: Sit to/from Stand Sit to Stand: Supervision           General transfer comment: using walker to stand      Balance Overall balance assessment: Needs assistance Sitting-balance support: No upper extremity supported, Feet supported Sitting balance-Leahy Scale: Good     Standing balance support: Reliant on assistive device for balance Standing balance-Leahy Scale: Poor                             ADL either performed or assessed with clinical judgement   ADL Overall ADL's : Needs assistance/impaired Eating/Feeding: Modified independent;Sitting   Grooming: Oral care;Set up;Sitting Grooming Details (indicate cue type and reason): Assistance to feel water for safety to ensure a safe tempurature. Pt requesting to perform seated due to she is fatigued after other therapies/tests today Upper Body Bathing: Set up;Sitting   Lower Body Bathing: Minimal assistance;Sitting/lateral leans Lower Body Bathing Details (indicate cue type and reason): Min A due to inability to reach R foot without breaking precautions and unclear whether precautions still needed to be followed at time of eval Upper Body Dressing : Set up;Sitting   Lower Body Dressing: Minimal  assistance;Sit to/from stand Lower Body Dressing Details (indicate cue type and reason): Min A to don R sock Toilet  Transfer: Supervision/safety;Ambulation;Rolling walker (2 wheels);Regular Toilet   Toileting- Clothing Manipulation and Hygiene: Supervision/safety;Sitting/lateral lean   Tub/ Shower Transfer: Min guard;Ambulation;Walk-in shower;Rolling walker (2 wheels) Tub/Shower Transfer Details (indicate cue type and reason): Pt able to verbalize how she performs shower transfer at home with shower seat as well as demo Functional mobility during ADLs: Supervision/safety;Rolling walker (2 wheels) General ADL Comments: Predominant concern of pt is weakness and numbness.     Vision Baseline Vision/History: 1 Wears glasses Ability to See in Adequate Light: 0 Adequate Patient Visual Report: Blurring of vision Vision Assessment?: Yes Eye Alignment: Within Functional Limits Ocular Range of Motion: Within Functional Limits Tracking/Visual Pursuits:  (Decreased spoothness of tracking at horizontal end ranges on L and R and overall decresed smoothmess on R vertically and horizontally.) Convergence: Within functional limits Visual Fields: No apparent deficits Additional Comments: pt denies double vision, and reports she feels like her L eye is the issue, but unclear during assessment. When closing eyes, pt unable to fully close L eye     Perception Perception Perception Tested?: No   Praxis Praxis Praxis tested?: Not tested    Pertinent Vitals/Pain Pain Assessment Pain Assessment: Faces Faces Pain Scale: Hurts a little bit Pain Location: R hip Pain Descriptors / Indicators: Discomfort Pain Intervention(s): Limited activity within patient's tolerance, Monitored during session, Relaxation     Hand Dominance Right   Extremity/Trunk Assessment Upper Extremity Assessment Upper Extremity Assessment: Generalized weakness;RUE deficits/detail;LUE deficits/detail RUE Deficits / Details: 4/5 BUE strength with 4+/5 grip strength on R. Pt unable to feel light touch below elbows. Pt reporting she can feel  hot/cold, but does not feel hot as much usually until too hot. Pt able to identify location of arm in space with eyes closed. able to perform finger opposition at normal pace. RUE Sensation: decreased light touch LUE Deficits / Details: 4/5 BUE strength with 4+/5 grip strength on R. Pt unable to feel light touch below elbows. Pt reporting she can feel hot/cold, but does not feel hot as much usually until too hot. Pt able to identify location of arm in space with eyes closed. able to perform finger opposition at normal pace. LUE Sensation: decreased light touch   Lower Extremity Assessment Lower Extremity Assessment: Defer to PT evaluation RLE Deficits / Details: AROM WFL within 90 degree limitations, hip flexion 3-/5, knee extension 4-/5, ankle DF 3+/5, PF 3/5 RLE Sensation: decreased light touch (decreased sensatino to knees) LLE Deficits / Details: AROM WFL within 90 degree limitations, hip flexion 3/5, knee extension 4-/5, ankle DF 3+/5, PF 3/5 LLE Sensation: decreased light touch (to knees)   Cervical / Trunk Assessment Cervical / Trunk Assessment: Other exceptions Cervical / Trunk Exceptions: reports numbness in hips and abdomen as well   Communication Communication Communication: No difficulties   Cognition Arousal/Alertness: Awake/alert Behavior During Therapy: WFL for tasks assessed/performed Overall Cognitive Status: Within Functional Limits for tasks assessed                                 General Comments: able to recall 2/3 hip precautions, min cues to implement during ADL.     General Comments  VSS.    Exercises     Shoulder Instructions      Home Living Family/patient expects to be discharged to:: Private residence Living Arrangements: Parent  Available Help at Discharge: Family;Available PRN/intermittently Type of Home: House Home Access: Stairs to enter [employer] of Steps: 6 Entrance Stairs-Rails: Right Home Layout: One level      Bathroom Shower/Tub: Producer, television/film/video: Standard Bathroom Accessibility: Yes How Accessible: Accessible via walker Home Equipment: Rolling Walker (2 wheels);Shower seat;Wheelchair - manual;BSC/3in1;Crutches   Additional Comments: 7 falls since the surgery, had to try to be NWB after the surgery on crutches first then with walker, but UE's got weaker in third week so had to put weight, supposed to see MD on 13th, but right now not sure if PWB or WBAT      Prior Functioning/Environment Prior Level of Function : Needs assist             Mobility Comments: needing help with walker when outside the house; mother owns her own alteration shop and pt has been going to her shop with her after the initial falls. ADLs Comments: using seat in shower and has help for shower transfers. mother has been donning socks        OT Problem List: Decreased strength;Decreased activity tolerance;Impaired balance (sitting and/or standing);Impaired vision/perception;Decreased knowledge of use of DME or AE;Impaired sensation;Pain      OT Treatment/Interventions: Self-care/ADL training;Therapeutic exercise;DME and/or AE instruction;Therapeutic activities;Visual/perceptual remediation/compensation;Patient/family education;Balance training    OT Goals(Current goals can be found in the care plan section) Acute Rehab OT Goals Patient Stated Goal: be able to feel OT Goal Formulation: With patient Time For Goal Achievement: 02/13/22 Potential to Achieve Goals: Good  OT Frequency: Min 2X/week    Co-evaluation              AM-PAC OT "6 Clicks" Daily Activity     Outcome Measure Help from another person eating meals?: None Help from another person taking care of personal grooming?: A Little Help from another person toileting, which includes using toliet, bedpan, or urinal?: A Little Help from another person bathing (including washing, rinsing, drying)?: A Little Help from another person  to put on and taking off regular upper body clothing?: A Little Help from another person to put on and taking off regular lower body clothing?: A Little 6 Click Score: 19   End of Session Equipment Utilized During Treatment: Gait belt;Rolling walker (2 wheels) Nurse Communication: Mobility status  Activity Tolerance: Patient tolerated treatment well Patient left: in bed;with call bell/phone within reach;with bed alarm set  OT Visit Diagnosis: Unsteadiness on feet (R26.81);Muscle weakness (generalized) (M62.81);Other abnormalities of gait and mobility (R26.89);Pain Pain - Right/Left: Right Pain - part of body: Hip                Time: 1417-1450 OT Time Calculation (min): 33 min Charges:  OT General Charges $OT Visit: 1 Visit OT Evaluation $OT Eval Moderate Complexity: 1 Mod OT Treatments $Self Care/Home Management : 8-22 mins  Tyler Deis, OTR/L [facility] Acute Rehabilitation Office: [phone]   Myrla Halsted 01/30/2022, 5:17 PM

## 2022-01-30 NOTE — Progress Notes (Signed)
NIF -28  VC 3.1L

## 2022-01-30 NOTE — Progress Notes (Signed)
Neurology Progress Note  Brief HPI: 21 year old patient with history of eczema, intermittent hives, hip injury 3 years ago status postrepair in October reported progressive weakness and numbness in both and all extremities since October 30.  She was seen outpatient by Dr. Mikey College and had an EMG which confirmed acute demyelinating polyradiculoneuropathy, and was sent to the ED for treatment with IVIG versus Plex.  Patient has began treatment with IVIG and already reports some improvement in sensation and in her gait.  Subjective: Patient is receiving IVIG treatment #3.  States weakness is unchanged since yesterday but improved since admission  Exam: Vitals:   01/30/22 0916 01/30/22 0931  BP: 125/76 125/82  Pulse: 87 83  Resp:    Temp: 98.4 F (36.9 C)   SpO2: 100% 100%   Gen: In bed, NAD Resp: non-labored breathing, no acute distress  Neuro: Mental Status: Alert and oriented to person place time and situation Cranial Nerves: Pupils equal round and reactive, extraocular movements intact, face symmetrical, facial sensation symmetrical, phonation normal, hearing intact to voice, tongue midline Motor: 4+ out of 5 strength in bilateral shoulders, 4+ out of 5 strength with bilateral elbow flexion and elbow extension, 4- out of 5 strength in finger abduction 4 out of 5 strength with bilateral hip flexion, bilateral knee extension and knee flexion, 4 out of 5 strength in bilateral dorsiflexion and plantarflexion Sensory: Patient is unable to feel light touch in hands and forearms, but sensation is intact in bicep also unable to feel light touch in feet and calves, but is able to feel light touch above knee DTR: Unable to elicit Gait: Deferred  Pertinent Labs:    Latest Ref Rng & Units 01/29/2022    9:21 AM 01/28/2022    4:35 PM 01/27/2022   11:07 AM  CBC  WBC 4.0 - 10.5 K/uL 2.9  5.3  4.0   Hemoglobin 12.0 - 15.0 g/dL 9.5  85.4  62.7   Hematocrit 36.0 - 46.0 % 29.6  34.4  36.2   Platelets  150 - 400 K/uL 343  457  501        Latest Ref Rng & Units 01/30/2022    4:17 AM 01/29/2022    9:21 AM 01/28/2022    4:35 PM  BMP  Glucose 70 - 99 mg/dL 91  98  85   BUN 6 - 20 mg/dL 10  13  17    Creatinine 0.44 - 1.00 mg/dL  0.35  0.09   Sodium 135 - 145 mmol/L 137  136  134   Potassium 3.5 - 5.1 mmol/L 3.4  3.3  3.5   Chloride 98 - 111 mmol/L 106  106  103   CO2 22 - 32 mmol/L 24  23  22    Calcium 8.9 - 10.3 mg/dL 9.0  9.5  9.9      Imaging Reviewed: None performed  Assessment: 21 year old patient with history of eczema, hives and hip injury presents with worsening, ascending weakness and numbness in all extremities since October 30.  She was seen outpatient, had an EMG and was discovered to have acute demyelinating polyradiculoneuropathy.  She has presented here for treatment with IVIG and is taking her third dose today.  She already reports some improvement with sensation returning in bilateral palms and feet and weakness stable since yesterday but improved since admission.  Lumbar puncture performed and shows no red blood cells on fourth tube but a protein of 45.  Impression: GBS  Recommendations: 1) continue IVIG 0.4  g/kg every 24 hours 2) NIF and vital capacity every 12 hours 3) PT/OT Bing Neighbors, MD [facility] [phone]  If 7pm- 7am, please page neurology on call as listed in AMION.

## 2022-01-31 DIAGNOSIS — E43 Unspecified severe protein-calorie malnutrition: Secondary | ICD-10-CM | POA: Insufficient documentation

## 2022-01-31 LAB — BASIC METABOLIC PANEL
Anion gap: 8 (ref 5–15)
BUN: <5 mg/dL — ABNORMAL LOW (ref 6–20)
CO2: 23 mmol/L (ref 22–32)
Calcium: 9.5 mg/dL (ref 8.9–10.3)
Chloride: 106 mmol/L (ref 98–111)
Creatinine, Ser: 0.62 mg/dL (ref 0.44–1.00)
GFR, Estimated: >60 mL/min (ref >60)
Glucose, Bld: 98 mg/dL (ref 70–99)
Potassium: 3.8 mmol/L (ref 3.5–5.1)
Sodium: 137 mmol/L (ref 135–145)

## 2022-01-31 LAB — IGG CSF INDEX
Albumin CSF-mCnc: 27 mg/dL (ref 7–29)
Albumin: 4.1 g/dL (ref 4.0–5.0)
CSF IgG Index: 0.9 — ABNORMAL HIGH (ref 0.0–0.7)
IgG (Immunoglobin G), Serum: 1375 mg/dL (ref 719–1475)
IgG, CSF: 7.7 mg/dL — ABNORMAL HIGH (ref 0.0–6.7)
IgG/Alb Ratio, CSF: 0.29 — ABNORMAL HIGH (ref 0.00–0.25)

## 2022-01-31 LAB — MAGNESIUM: Magnesium: 1.6 mg/dL — ABNORMAL LOW (ref 1.7–2.4)

## 2022-01-31 MED ORDER — MAGNESIUM SULFATE 2 GM/50ML IV SOLN
2.0000 g | Freq: Once | INTRAVENOUS | Status: AC
  Administered 2022-01-31: 2 g via INTRAVENOUS
  Filled 2022-01-31: qty 50

## 2022-01-31 NOTE — Progress Notes (Signed)
PT Cancellation Note  Patient Details Name: Savannah Alvarez MRN: [id number] DOB: January 17, 2001   Cancelled Treatment:    Reason Eval/Treat Not Completed: (P) Patient declined, no reason specified (Pt refusing tx this am despite education and encouragement.  Pt presenting very drowsy and reported numerous times she just wished to rest today.  Will f/u per POC.)    Artis Delay 01/31/2022, 11:26 AM  Bonney Leitz , PTA Acute Rehabilitation Services Office [phone]

## 2022-01-31 NOTE — Progress Notes (Signed)
PROGRESS NOTE    Savannah Alvarez  [id number] DOB: [birth year] DOA: 01/28/2022 PCP: Jeannene Patella, PA    Brief Narrative:   Savannah Alvarez is a 21 y.o. 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  Patient reports on October 30, she woke up with a tingling sensation in bilateral hands and feet which progressively worsened to all extremities.  She reports 7 falls since mid November and difficulty holding things in her hands such as utensils.  She was seen by neurology outpatient, Dr. Krista Blue and had EMG-NCS performed which confirmed acute demyelinating polyradiculoneuropathy, mild axonal component and she was directed to the ED for further evaluation and treatment with IVIG versus Plex and LP.  In the ED, temperature 98.1 F, HR 100, RR 16, BP 130/82, SpO2 100% on room air.  WBC 5.3, hemoglobin 10.7, platelets 457.  Sodium 134, potassium 3.4, chloride 1 3, CO2 22, glucose 85, BUN 17, creatinine 0.65.  hCG negative.  TSH 1.383.  RPR nonreactive.  Urinalysis unrevealing.  Neurology was consulted.  Patient underwent lumbar puncture.  TRH consulted for for admission for further evaluation management of AIDP/Guillain-Barr syndrome.  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  Patient has had been having progressive weakness/tingling of her extremities since October 30.  Patient underwent lumbar puncture on admission with WBC count 2, total protein 45, glucose 60.  Meningitis viral PCR panel negative.  RPR/HIV nonreactive. -- Neurology following, appreciate assistance -- CSF culture:  Gram stain with no organisms, no growth x 1 day -- Oligoclonal bands: Pending -- CSF IgG: Pending -- CSF VRDL: Pending -- SPEP: Pending -- Lyme's disease titer: Pending -- Vitamin B6: Pending -- Started on IVIG daily x 5 days; day #3/5  Hypokalemia Hypomagnesemia Potassium 3.8, magnesium 1.6, will replete. -- Repeat electrolytes in a.m.  Right chronic hip pain/acetabular dysplasia Patient recently underwent right periacetabular osteotomy by orthopedic surgery at [facility] on 12/15/2021 by Dr. Sharilyn Sites. -- Continue outpatient follow-up with orthopedics.  Severe protein calorie malnutrition Body mass index is 21.49 kg/m. Nutrition Status: Nutrition Problem: Severe Malnutrition Etiology: acute illness (GBS) Signs/Symptoms: mild fat depletion, moderate muscle depletion, percent weight loss (12.3% weight loss in < 5 months) Percent weight loss: 12.3 % Interventions: Ensure Enlive (each supplement provides 350kcal and 20 grams of protein), Magic cup -- Dietitian following, continue to encourage increased oral intake, supplementation   DVT prophylaxis: SCDs Start: 01/29/22 [id number]    Code Status: Full Code Family Communication: Updated mother present at bedside this morning  Disposition Plan:  Level of care: Progressive Status is: Inpatient Remains inpatient appropriate because: On IVIG x 5 days    Consultants:  Neurology  Procedures:  Lumbar puncture  Antimicrobials:  None   Subjective: Patient seen examined bedside, resting comfortably.  Continues with weakness and numbness, slightly improved since initial presentation.  Receiving third dose of IVIG this morning.  Mother updated at bedside.  No other specific questions or concerns at this time. Denies headache, no fever/chills/night sweats, no nausea/vomiting/diarrhea, no chest pain, no abdominal pain, no shortness of breath, no cough/congestion.  No acute events overnight per nursing staff.  Objective: Vitals:    01/31/22 1000 01/31/22 1018 01/31/22 1035 01/31/22  1105  BP: 126/78 124/84 127/81 124/82  Pulse: 92 88 89 91  Resp: 18 16 16    Temp:      TempSrc:      SpO2: 100% 100% 100% 100%  Weight:      Height:       No intake or output data in the 24 hours ending 01/31/22 1131  Filed Weights   01/29/22 0130  Weight: 53.3 kg    Examination:  Physical Exam: GEN: NAD, alert and oriented x 3, thin in appearance HEENT: NCAT, PERRL, EOMI, sclera clear, MMM PULM: CTAB w/o wheezes/crackles, normal respiratory effort, on room air CV: RRR w/o M/G/R GI: abd soft, NTND, NABS, no R/G/M MSK: no peripheral edema, muscle strength left upper/lower extremities and right upper/lower extremities 4+/5 NEURO: CN II-XII intact, decreased sensation to light touch diminished bilateral upper extremities from mid lower arm to hand and lower extremities from mid shin distally PSYCH: normal mood/affect Integumentary: dry/intact, no rashes or wounds    Data Reviewed: I have personally reviewed following labs and imaging studies  CBC: Recent Labs  Lab 01/27/22 1107 01/28/22 1635 01/29/22 0921  WBC 4.0 5.3 2.9*  NEUTROABS 1.1*  --   --   HGB 11.8 10.7* 9.5*  HCT 36.2 34.4* 29.6*  MCV 79 82.1 81.5  PLT 501* 457* [id number]   Basic Metabolic Panel: Recent Labs  Lab 01/27/22 1107 01/28/22 1635 01/29/22 0044 01/29/22 0921 01/30/22 0417 01/31/22 0304  NA 137 134*  --  136 137 137  K 5.2 3.5  --  3.3* 3.4* 3.8  CL 98 103  --  106 106 106  CO2 17* 22  --  23 24 23   GLUCOSE 85 85  --  98 91 98  BUN 15 17  --  13 10 <5*  CREATININE 0.76 0.65  --  0.66 0.61 0.62  CALCIUM 10.5* 9.9  --  9.5 9.0 9.5  MG  --   --  1.6* 1.5* 1.7 1.6*  PHOS  --   --  3.3 3.8  --   --    GFR: Estimated Creatinine Clearance: 89.5 mL/min (by C-G formula based on SCr of 0.62 mg/dL). Liver Function Tests: Recent Labs  Lab 01/27/22 1107 01/29/22 0044 01/29/22 0921  AST 38 23 21  ALT 21 17 17   ALKPHOS 85 56 50  BILITOT 0.7  0.4 0.6  PROT 8.6* 6.8 7.3  ALBUMIN 4.9 3.5 3.4*   No results for input(s): "LIPASE", "AMYLASE" in the last 168 hours. No results for input(s): "AMMONIA" in the last 168 hours. Coagulation Profile: No results for input(s): "INR", "PROTIME" in the last 168 hours. Cardiac Enzymes: Recent Labs  Lab 01/27/22 1107 01/29/22 0044  CKTOTAL 188* 106   BNP (last 3 results) No results for input(s): "PROBNP" in the last 8760 hours. HbA1C: No results for input(s): "HGBA1C" in the last 72 hours.  CBG: No results for input(s): "GLUCAP" in the last 168 hours. Lipid Profile: No results for input(s): "CHOL", "HDL", "LDLCALC", "TRIG", "CHOLHDL", "LDLDIRECT" in the last 72 hours. Thyroid Function Tests: Recent Labs    01/29/22 0044  TSH 1.383   Anemia Panel: Recent Labs    01/29/22 0044  VITAMINB12 363  FOLATE 12.3  FERRITIN 22  TIBC 403  IRON 22*  RETICCTPCT 1.1   Sepsis Labs: No results for input(s): "PROCALCITON", "LATICACIDVEN" in the last 168 hours.  Recent Results (from the past 240 hour(s))  CSF culture w Gram Stain     Status:  None (Preliminary result)   Collection Time: 01/29/22  1:27 AM   Specimen: CSF; Cerebrospinal Fluid  Result Value Ref Range Status   Specimen Description CSF  Final   Special Requests LP  Final   Gram Stain NO WBC SEEN NO ORGANISMS SEEN   Final   Culture   Final    NO GROWTH 1 DAY Performed at [facility], [address], [place], [place] [address]    Report Status PENDING  Incomplete         Radiology Studies: No results found.      Scheduled Meds:  feeding supplement  237 mL Oral BID BM   multivitamin with minerals  1 tablet Oral Daily   sertraline  50 mg Oral Daily   Continuous Infusions:  Immune Globulin 10% 120 mL/hr at 01/31/22 1104   sodium chloride 1,000 mL (01/31/22 0456)     LOS: 2 days    Time spent: 43 minutes spent on chart review, discussion with nursing staff, consultants, updating family and  interview/physical exam; more than 50% of that time was spent in counseling and/or coordination of care.    Alvira Philips [place], DO [facility] Available via Epic secure chat 7am-7pm After these hours, please refer to coverage provider listed on [email or web address] 01/31/2022, 11:31 AM

## 2022-01-31 NOTE — Progress Notes (Signed)
Pt was able to produce a NIF -25, VC 2.8L with great effort.

## 2022-01-31 NOTE — Plan of Care (Signed)

## 2022-02-01 LAB — CBC WITH DIFFERENTIAL/PLATELET
Basophils Absolute: 0 10*3/uL (ref 0.0–0.2)
Basos: 1 %
EOS (ABSOLUTE): 0 10*3/uL (ref 0.0–0.4)
Eos: 1 %
Hematocrit: 36.2 % (ref 34.0–46.6)
Hemoglobin: 11.8 g/dL (ref 11.1–15.9)
Immature Grans (Abs): 0 10*3/uL (ref 0.0–0.1)
Immature Granulocytes: 0 %
Lymphocytes Absolute: 2.5 10*3/uL (ref 0.7–3.1)
Lymphs: 62 %
MCH: 25.7 pg — ABNORMAL LOW (ref 26.6–33.0)
MCHC: 32.6 g/dL (ref 31.5–35.7)
MCV: 79 fL (ref 79–97)
Monocytes Absolute: 0.4 10*3/uL (ref 0.1–0.9)
Monocytes: 9 %
Neutrophils Absolute: 1.1 10*3/uL — ABNORMAL LOW (ref 1.4–7.0)
Neutrophils: 27 %
Platelets: 501 10*3/uL — ABNORMAL HIGH (ref 150–450)
RBC: 4.59 x10E6/uL (ref 3.77–5.28)
RDW: 14.5 % (ref 11.7–15.4)
WBC: 4 10*3/uL (ref 3.4–10.8)

## 2022-02-01 LAB — COMPREHENSIVE METABOLIC PANEL
ALT: 21 IU/L (ref 0–32)
AST: 38 IU/L (ref 0–40)
Albumin/Globulin Ratio: 1.3 (ref 1.2–2.2)
Albumin: 4.9 g/dL (ref 4.0–5.0)
Alkaline Phosphatase: 85 IU/L (ref 42–106)
BUN/Creatinine Ratio: 20 (ref 9–23)
BUN: 15 mg/dL (ref 6–20)
Bilirubin Total: 0.7 mg/dL (ref 0.0–1.2)
CO2: 17 mmol/L — ABNORMAL LOW (ref 20–29)
Calcium: 10.5 mg/dL — ABNORMAL HIGH (ref 8.7–10.2)
Chloride: 98 mmol/L (ref 96–106)
Creatinine, Ser: 0.76 mg/dL (ref 0.57–1.00)
Globulin, Total: 3.7 g/dL (ref 1.5–4.5)
Glucose: 85 mg/dL (ref 70–99)
Potassium: 5.2 mmol/L (ref 3.5–5.2)
Sodium: 137 mmol/L (ref 134–144)
Total Protein: 8.6 g/dL — ABNORMAL HIGH (ref 6.0–8.5)
eGFR: 116 mL/min/{1.73_m2} (ref >59)

## 2022-02-01 LAB — BASIC METABOLIC PANEL
Anion gap: 9 (ref 5–15)
BUN: 6 mg/dL (ref 6–20)
CO2: 23 mmol/L (ref 22–32)
Calcium: 9.2 mg/dL (ref 8.9–10.3)
Chloride: 104 mmol/L (ref 98–111)
Creatinine, Ser: 0.62 mg/dL (ref 0.44–1.00)
GFR, Estimated: >60 mL/min (ref >60)
Glucose, Bld: 88 mg/dL (ref 70–99)
Potassium: 3.6 mmol/L (ref 3.5–5.1)
Sodium: 136 mmol/L (ref 135–145)

## 2022-02-01 LAB — CK: Total CK: 188 U/L — ABNORMAL HIGH (ref 32–182)

## 2022-02-01 LAB — ANA W/REFLEX: Anti Nuclear Antibody (ANA): NEGATIVE

## 2022-02-01 LAB — VITAMIN B6
Vitamin B6: 15 ug/L (ref 3.4–65.2)
Vitamin B6: 11.1 ug/L (ref 3.4–65.2)

## 2022-02-01 LAB — C-REACTIVE PROTEIN: CRP: <1 mg/L (ref 0–10)

## 2022-02-01 LAB — CSF CULTURE W GRAM STAIN
Culture: NO GROWTH
Gram Stain: NONE SEEN

## 2022-02-01 LAB — RHEUMATOID FACTOR: Rheumatoid fact SerPl-aCnc: <10 IU/mL (ref <14.0)

## 2022-02-01 LAB — SEDIMENTATION RATE: Sed Rate: 45 mm/hr — ABNORMAL HIGH (ref 0–32)

## 2022-02-01 LAB — VITAMIN B1: Thiamine: 75.9 nmol/L (ref 66.5–200.0)

## 2022-02-01 LAB — RPR: RPR Ser Ql: NONREACTIVE

## 2022-02-01 LAB — TSH: TSH: 0.592 u[IU]/mL (ref 0.450–4.500)

## 2022-02-01 LAB — HGB A1C W/O EAG: Hgb A1c MFr Bld: 5.1 % (ref 4.8–5.6)

## 2022-02-01 LAB — VITAMIN D 25 HYDROXY (VIT D DEFICIENCY, FRACTURES): Vit D, 25-Hydroxy: 46.1 ng/mL (ref 30.0–100.0)

## 2022-02-01 NOTE — Progress Notes (Signed)
Neurology Progress Note  Brief HPI: 21 year old patient with history of eczema, intermittent hives, hip injury 3 years ago status postrepair in October reported progressive weakness and numbness in both and all extremities since October 30.  She was seen outpatient by Dr. Mikey College and had an EMG which confirmed acute demyelinating polyradiculoneuropathy, and was sent to the ED for treatment with IVIG versus Plex.  Patient has began treatment with IVIG and already reports some improvement in sensation and in her gait.  Subjective: Patient is receiving IVIG treatment #4.  Numbness and weakness significantly improved. Walking with a walker. In good spirits.  Exam: Vitals:   02/01/22 1631 02/01/22 2025  BP: 115/70   Pulse: 88   Resp: 17 18  Temp: 98.3 F (36.8 C) 99 F (37.2 C)  SpO2: 99% 100%   Gen: In bed, NAD Resp: non-labored breathing, no acute distress  Neuro: Mental Status: Alert and oriented to person place time and situation Cranial Nerves: Pupils equal round and reactive, extraocular movements intact, face symmetrical, facial sensation symmetrical, phonation normal, hearing intact to voice, tongue midline Motor: 5/5 throughout BUE except 4+/5 tricep and grip on R. RLE 5/5 HF, 4+/5 KE, 4/5 KF, 4+/5 PF, 4/5 DF. LLE 5/5 throughout except 4+/5 KF and DF.  4 out of 5 strength with bilateral hip flexion, bilateral knee extension and knee flexion, 4 out of 5 strength in bilateral dorsiflexion and plantarflexion Sensory: Improved to LT x4 extrem DTR: Unable to elicit BLE, 1+ BUE Gait: Deferred  Pertinent Labs:    Latest Ref Rng & Units 01/29/2022    9:21 AM 01/28/2022    4:35 PM 01/27/2022   11:07 AM  CBC  WBC 4.0 - 10.5 K/uL 2.9  5.3  4.0   Hemoglobin 12.0 - 15.0 g/dL 9.5  61.6  07.3   Hematocrit 36.0 - 46.0 % 29.6  34.4  36.2   Platelets 150 - 400 K/uL 343  457  501        Latest Ref Rng & Units 02/01/2022    2:40 AM 01/31/2022    3:04 AM 01/30/2022    4:17 AM  BMP  Glucose  70 - 99 mg/dL 88  98  91   BUN 6 - 20 mg/dL 6  <5  10   Creatinine 0.44 - 1.00 mg/dL 7.10  6.26  9.48   Sodium 135 - 145 mmol/L 136  137  137   Potassium 3.5 - 5.1 mmol/L 3.6  3.8  3.4   Chloride 98 - 111 mmol/L 104  106  106   CO2 22 - 32 mmol/L 23  23  24    Calcium 8.9 - 10.3 mg/dL 9.2  9.5  9.0      Imaging Reviewed: None performed  Assessment: 21 year old patient with history of eczema, hives and hip injury presents with worsening, ascending weakness and numbness in all extremities since October 30.  She was seen outpatient, had an EMG and was discovered to have acute demyelinating polyradiculoneuropathy.  She has presented here for treatment with IVIG and is taking her fourth dose today.  Weakness significantly improved. Lumbar puncture performed and shows no red blood cells on fourth tube but a protein of 45.  Impression: GBS  Recommendations: 1) continue IVIG 0.4 g/kg every 24 hours, end date 12/11 2) NIF and vital capacity every 12 hours 3) PT/OT 4) She has improved significantly on IVIG and has had no respiratory issues whatsoever. Her family is planning on taking a cruise one week  after hospital discharge and really still want to go on the trip. Given her remarkable improvement I told them I felt if they wished to go then it would be reasonable to do so as long as they notify the ship medical staff and make arrangements for wheelchair/walker and of course change the plan if she has any decline whatsoever.  Bing Neighbors, MD [facility] [phone]  If 7pm- 7am, please page neurology on call as listed in AMION.

## 2022-02-01 NOTE — Progress Notes (Signed)
Physical Therapy Treatment Patient Details Name: Savannah Alvarez MRN: [id number] DOB: 2000/06/29 Today's Date: 02/01/2022   History of Present Illness Savannah Alvarez is a 21 y.o. 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    PT Comments    Patient agreeable to up with PT. Needed to use restroom and able to ambulate with minguard assist and RW 12 ft. Min assist onto/off of standard toilet. While sitting, began to report dizziness. Stated started earlier today. Stood and as initiated gait, reported dizziness was worse and returned to sit at EOB. No dynamap immediately available with pt reporting dizziness improved once seated. RN in and made aware. Pt deferred exercises as nursing offered to do her bath while seated EOB and she was eager to do this as she reported she did not get to bathe yesterday. Encouraged to do her hip exercises when she returns to supine.     Recommendations for follow up therapy are one component of a multi-disciplinary discharge planning process, led by the attending physician.  Recommendations may be updated based on patient status, additional functional criteria and insurance authorization.  Follow Up Recommendations  Outpatient PT     Assistance Recommended at Discharge Intermittent Supervision/Assistance  Patient can return home with the following Help with stairs or ramp for entrance;Assistance with cooking/housework;Assist for transportation   Equipment Recommendations  None recommended by PT    Recommendations for Other Services       Precautions / Restrictions Precautions Precautions: Fall Precaution Comments: WBAT and implementing hip precautions during session. Restrictions Weight Bearing Restrictions: Yes RLE Weight Bearing: Weight  bearing as tolerated Other Position/Activity Restrictions: Has been following posterior hip precautions. Nothing in regard to precautions in orders, but maintained during session for safety.     Mobility  Bed Mobility Overal bed mobility: Modified Independent                  Transfers Overall transfer level: Needs assistance Equipment used: Rolling walker (2 wheels) Transfers: Sit to/from Stand Sit to Stand: Min assist           General transfer comment: uses one hand on RW as comes to stand; assist off EOB and off standard toilet    Ambulation/Gait Ambulation/Gait assistance: Min guard Gait Distance (Feet): 12 Feet (toileted; 12) Assistive device: Rolling walker (2 wheels) Gait Pattern/deviations: Step-to pattern, Decreased stride length, Decreased dorsiflexion - right, Decreased dorsiflexion - left, Wide base of support, Ataxic   Gait velocity interpretation: <1.31 ft/sec, indicative of household ambulator   General Gait Details: limited core stability with moderate UE reliance; ambulation distance limited due to dizziness (pt reports worsening while upright--?orthostatic)   Stairs             Wheelchair Mobility    Modified Rankin (Stroke Patients Only)       Balance Overall balance assessment: Needs assistance Sitting-balance support: No upper extremity supported, Feet supported Sitting balance-Leahy Scale: Good     Standing balance support: Reliant on assistive device for balance Standing balance-Leahy Scale: Poor                              Cognition Arousal/Alertness: Awake/alert (awakened and initially sleepy) Behavior During Therapy: WFL for tasks assessed/performed Overall Cognitive Status: Within Functional Limits for tasks  assessed                                 General Comments: able to recall 2/3 hip precautions, min cues to implement during toileting        Exercises Other Exercises Other  Exercises: Pt wanting to sit and wash up on side of bed on return from bathroom (nursing present to assist). Pt able to verbalize the hip exercises she has been doing at home and encouraged to do these throughout the day as she is much less mobile in the hospital. (ankle pumps, glut sets, heelslides, supine hip abduction).    General Comments        Pertinent Vitals/Pain Pain Assessment Pain Assessment: Faces Faces Pain Scale: Hurts a little bit Pain Location: R hip Pain Descriptors / Indicators: Discomfort Pain Intervention(s): Limited activity within patient's tolerance, Monitored during session    Home Living                          Prior Function            PT Goals (current goals can now be found in the care plan section) Acute Rehab PT Goals Patient Stated Goal: to get stronger PT Goal Formulation: With patient/family Time For Goal Achievement: 02/13/22 Potential to Achieve Goals: Good Progress towards PT goals: Not progressing toward goals - comment (limited by dizziness)    Frequency    Min 4X/week      PT Plan Current plan remains appropriate    Co-evaluation              AM-PAC PT "6 Clicks" Mobility   Outcome Measure  Help needed turning from your back to your side while in a flat bed without using bedrails?: None Help needed moving from lying on your back to sitting on the side of a flat bed without using bedrails?: None Help needed moving to and from a bed to a chair (including a wheelchair)?: A Little Help needed standing up from a chair using your arms (e.g., wheelchair or bedside chair)?: A Little Help needed to walk in hospital room?: A Little Help needed climbing 3-5 steps with a railing? : A Little 6 Click Score: 20    End of Session Equipment Utilized During Treatment: Gait belt Activity Tolerance: Patient limited by fatigue;Treatment limited secondary to medical complications (Comment) (dizziness; ?orthostasis) Patient left:  with call bell/phone within reach;in bed;with nursing/sitter in room Nurse Communication: Mobility status;Other (comment) (dizziness when up) PT Visit Diagnosis: Other abnormalities of gait and mobility (R26.89);Muscle weakness (generalized) (M62.81);Other symptoms and signs involving the nervous system (R29.898)     Time: 1062-6948 PT Time Calculation (min) (ACUTE ONLY): 19 min  Charges:  $Gait Training: 8-22 mins                      Jerolyn Center, PT Acute Rehabilitation Services  Office [phone]    Zena Amos 02/01/2022, 11:14 AM

## 2022-02-01 NOTE — Progress Notes (Signed)
Patient achieved -40 NIF and 3.16 L on VC. Patient with great effort.

## 2022-02-01 NOTE — Progress Notes (Signed)
Neurology Progress Note  Brief HPI: 21 year old patient with history of eczema, intermittent hives, hip injury 3 years ago status postrepair in October reported progressive weakness and numbness in both and all extremities since October 30.  She was seen outpatient by Dr. Mikey College and had an EMG which confirmed acute demyelinating polyradiculoneuropathy, and was sent to the ED for treatment with IVIG versus Plex.  Patient has began treatment with IVIG and already reports some improvement in sensation and in her gait.  Subjective: Patient is receiving IVIG treatment #3.  States weakness is unchanged since yesterday but improved since admission  Exam: Vitals:   02/01/22 1631 02/01/22 2025  BP: 115/70   Pulse: 88   Resp: 17 18  Temp: 98.3 F (36.8 C) 99 F (37.2 C)  SpO2: 99% 100%   Gen: In bed, NAD Resp: non-labored breathing, no acute distress  Neuro: Mental Status: Alert and oriented to person place time and situation Cranial Nerves: Pupils equal round and reactive, extraocular movements intact, face symmetrical, facial sensation symmetrical, phonation normal, hearing intact to voice, tongue midline Motor: 4+ out of 5 strength in bilateral shoulders, 4+ out of 5 strength with bilateral elbow flexion and elbow extension, 4- out of 5 strength in finger abduction 4 out of 5 strength with bilateral hip flexion, bilateral knee extension and knee flexion, 4 out of 5 strength in bilateral dorsiflexion and plantarflexion Sensory: Patient is unable to feel light touch in hands and forearms, but sensation is intact in bicep also unable to feel light touch in feet and calves, but is able to feel light touch above knee DTR: Unable to elicit Gait: Deferred  Pertinent Labs:    Latest Ref Rng & Units 01/29/2022    9:21 AM 01/28/2022    4:35 PM 01/27/2022   11:07 AM  CBC  WBC 4.0 - 10.5 K/uL 2.9  5.3  4.0   Hemoglobin 12.0 - 15.0 g/dL 9.5  53.6  14.4   Hematocrit 36.0 - 46.0 % 29.6  34.4  36.2    Platelets 150 - 400 K/uL 343  457  501        Latest Ref Rng & Units 02/01/2022    2:40 AM 01/31/2022    3:04 AM 01/30/2022    4:17 AM  BMP  Glucose 70 - 99 mg/dL 88  98  91   BUN 6 - 20 mg/dL 6  <5  10   Creatinine 0.44 - 1.00 mg/dL 3.15  4.00  8.67   Sodium 135 - 145 mmol/L 136  137  137   Potassium 3.5 - 5.1 mmol/L 3.6  3.8  3.4   Chloride 98 - 111 mmol/L 104  106  106   CO2 22 - 32 mmol/L 23  23  24    Calcium 8.9 - 10.3 mg/dL 9.2  9.5  9.0      Imaging Reviewed: None performed  Assessment: 21 year old patient with history of eczema, hives and hip injury presents with worsening, ascending weakness and numbness in all extremities since October 30.  She was seen outpatient, had an EMG and was discovered to have acute demyelinating polyradiculoneuropathy.  She has presented here for treatment with IVIG and is taking her third dose today.  She already reports some improvement with sensation returning in bilateral palms and feet and weakness stable since yesterday but improved since admission.  Lumbar puncture performed and shows no red blood cells on fourth tube but a protein of 45.  Impression: GBS  Recommendations: 1)  continue IVIG 0.4 g/kg every 24 hours 2) NIF and vital capacity every 12 hours 3) PT/OT Bing Neighbors, MD [facility] [phone]  If 7pm- 7am, please page neurology on call as listed in AMION.

## 2022-02-01 NOTE — Progress Notes (Signed)
PROGRESS NOTE    Savannah Alvarez  LKH:[id number] DOB: 2000-05-03 DOA: 01/28/2022 PCP: Remo Lipps, PA    Brief Narrative:   Savannah Alvarez is a 21 y.o. 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  Patient reports on October 30, she woke up with a tingling sensation in bilateral hands and feet which progressively worsened to all extremities.  She reports 7 falls since mid November and difficulty holding things in her hands such as utensils.  She was seen by neurology outpatient, Dr. Terrace Arabia and had EMG-NCS performed which confirmed acute demyelinating polyradiculoneuropathy, mild axonal component and she was directed to the ED for further evaluation and treatment with IVIG versus Plex and LP.  In the ED, temperature 98.1 F, HR 100, RR 16, BP 130/82, SpO2 100% on room air.  WBC 5.3, hemoglobin 10.7, platelets 457.  Sodium 134, potassium 3.4, chloride 1 3, CO2 22, glucose 85, BUN 17, creatinine 0.65.  hCG negative.  TSH 1.383.  RPR nonreactive.  Urinalysis unrevealing.  Neurology was consulted.  Patient underwent lumbar puncture.  TRH consulted for for admission for further evaluation management of AIDP/Guillain-Barr syndrome.  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  Patient has had been having progressive weakness/tingling of her extremities since October 30.  Patient underwent lumbar puncture on admission with WBC count 2, total protein 45, glucose 60.  Meningitis viral PCR panel negative.  RPR/HIV nonreactive.  CSF IgG elevated at 7.7. -- Neurology following, appreciate  assistance -- CSF culture: Gram stain with no organisms, no growth x 1 day -- Oligoclonal bands: Pending -- CSF VRDL: Pending -- SPEP: Pending -- Lyme's disease titer: Pending -- Vitamin B6: Pending -- Started on IVIG daily x 5 days; day #4/5  Hypokalemia Hypomagnesemia Repleted during hospitalization.  Right chronic hip pain/acetabular dysplasia Patient recently underwent right periacetabular osteotomy by orthopedic surgery at [facility] on 12/15/2021 by Dr. Rolanda Jay. -- Continue outpatient follow-up with orthopedics.  Severe protein calorie malnutrition Body mass index is 21.49 kg/m. Nutrition Status: Nutrition Problem: Severe Malnutrition Etiology: acute illness (GBS) Signs/Symptoms: mild fat depletion, moderate muscle depletion, percent weight loss (12.3% weight loss in < 5 months) Percent weight loss: 12.3 % Interventions: Ensure Enlive (each supplement provides 350kcal and 20 grams of protein), Magic cup -- Dietitian following, continue to encourage increased oral intake, supplementation   DVT prophylaxis: SCDs Start: 01/29/22 0964    Code Status: Full Code Family Communication: Updated family present at bedside this morning  Disposition Plan:  Level of care: Progressive Status is: Inpatient Remains inpatient appropriate because: On IVIG x 5 days    Consultants:  Neurology  Procedures:  Lumbar puncture  Antimicrobials:  None   Subjective: Patient seen examined bedside, resting comfortably.  Lying in bed.  Family present at bedside.  Reports numbness and weakness to bilateral hands improved, also improved weakness to bilateral lower extremities but persists with some paresthesias.  IV infiltrated this morning now replaced.  To receive fourth dose of IVIG today.  No other specific questions or concerns at this time. Denies headache, no fever/chills/night sweats, no nausea/vomiting/diarrhea, no chest pain, no abdominal pain, no shortness of breath, no  cough/congestion.  No acute events overnight  per nursing staff.  Objective: Vitals:   01/31/22 2006 01/31/22 2317 02/01/22 0402 02/01/22 0807  BP: 128/72 114/66 122/64 121/78  Pulse: 85 90 83 81  Resp: 18 16 16 17   Temp: 99.4 F (37.4 C) 98.8 F (37.1 C) 98.3 F (36.8 C) 98.2 F (36.8 C)  TempSrc: Oral Oral Oral Oral  SpO2: 100% 100% 100% 100%  Weight:      Height:       No intake or output data in the 24 hours ending 02/01/22 1029  Filed Weights   01/29/22 0130  Weight: 53.3 kg    Examination:  Physical Exam: GEN: NAD, alert and oriented x 3, thin in appearance HEENT: NCAT, PERRL, EOMI, sclera clear, MMM PULM: CTAB w/o wheezes/crackles, normal respiratory effort, on room air CV: RRR w/o M/G/R GI: abd soft, NTND, NABS, no R/G/M MSK: no peripheral edema, muscle strength left upper/lower extremities and right upper/lower extremities 4+/5 NEURO: CN II-XII intact, decreased sensation to light touch diminished bilateral upper extremities from mid lower arm to hand and lower extremities from mid shin distally PSYCH: normal mood/affect Integumentary: dry/intact, no rashes or wounds    Data Reviewed: I have personally reviewed following labs and imaging studies  CBC: Recent Labs  Lab 01/27/22 1107 01/28/22 1635 01/29/22 0921  WBC 4.0 5.3 2.9*  NEUTROABS 1.1*  --   --   HGB 11.8 10.7* 9.5*  HCT 36.2 34.4* 29.6*  MCV 79 82.1 81.5  PLT 501* 457* 343   Basic Metabolic Panel: Recent Labs  Lab 01/28/22 1635 01/29/22 0044 01/29/22 0921 01/30/22 0417 01/31/22 0304 02/01/22 0240  NA 134*  --  136 137 137 136  K 3.5  --  3.3* 3.4* 3.8 3.6  CL 103  --  106 106 106 104  CO2 22  --  23 24 23 23   GLUCOSE 85  --  98 91 98 88  BUN 17  --  13 10 <5* 6  CREATININE 0.65  --  0.66 0.61 0.62 0.62  CALCIUM 9.9  --  9.5 9.0 9.5 9.2  MG  --  1.6* 1.5* 1.7 1.6*  --   PHOS  --  3.3 3.8  --   --   --    GFR: Estimated Creatinine Clearance: 89.5 mL/min (by C-G formula based on  SCr of 0.62 mg/dL). Liver Function Tests: Recent Labs  Lab 01/27/22 1107 01/29/22 0044 01/29/22 0125 01/29/22 0921  AST 38 23  --  21  ALT 21 17  --  17  ALKPHOS 85 56  --  50  BILITOT 0.7 0.4  --  0.6  PROT 8.6* 6.8  --  7.3  ALBUMIN 4.9 3.5 4.1 3.4*   No results for input(s): "LIPASE", "AMYLASE" in the last 168 hours. No results for input(s): "AMMONIA" in the last 168 hours. Coagulation Profile: No results for input(s): "INR", "PROTIME" in the last 168 hours. Cardiac Enzymes: Recent Labs  Lab 01/27/22 1107 01/29/22 0044  CKTOTAL 188* 106   BNP (last 3 results) No results for input(s): "PROBNP" in the last 8760 hours. HbA1C: No results for input(s): "HGBA1C" in the last 72 hours.  CBG: No results for input(s): "GLUCAP" in the last 168 hours. Lipid Profile: No results for input(s): "CHOL", "HDL", "LDLCALC", "TRIG", "CHOLHDL", "LDLDIRECT" in the last 72 hours. Thyroid Function Tests: No results for input(s): "TSH", "T4TOTAL", "FREET4", "T3FREE", "THYROIDAB" in the last 72 hours.  Anemia Panel: No results for input(s): "VITAMINB12", "FOLATE", "FERRITIN", "TIBC", "IRON", "RETICCTPCT" in  the last 72 hours.  Sepsis Labs: No results for input(s): "PROCALCITON", "LATICACIDVEN" in the last 168 hours.  Recent Results (from the past 240 hour(s))  CSF culture w Gram Stain     Status: None (Preliminary result)   Collection Time: 01/29/22  1:27 AM   Specimen: CSF; Cerebrospinal Fluid  Result Value Ref Range Status   Specimen Description CSF  Final   Special Requests LP  Final   Gram Stain NO WBC SEEN NO ORGANISMS SEEN   Final   Culture   Final    NO GROWTH 2 DAYS Performed at [facility], [address] [place], [place] [address]    Report Status PENDING  Incomplete         Radiology Studies: No results found.      Scheduled Meds:  feeding supplement  237 mL Oral BID BM   multivitamin with minerals  1 tablet Oral Daily   sertraline  50 mg Oral  Daily   Continuous Infusions:  Immune Globulin 10% Stopped (01/31/22 1302)   sodium chloride 1,000 mL (02/01/22 0635)     LOS: 3 days    Time spent: 43 minutes spent on chart review, discussion with nursing staff, consultants, updating family and interview/physical exam; more than 50% of that time was spent in counseling and/or coordination of care.    Alvira Philips [place], DO [facility] Available via Epic secure chat 7am-7pm After these hours, please refer to coverage provider listed on [email or web address] 02/01/2022, 10:29 AM

## 2022-02-01 NOTE — Progress Notes (Signed)
NIF -30 VC 2.6 Good Patient Effort

## 2022-02-02 ENCOUNTER — Telehealth: Payer: Self-pay | Admitting: Neurology

## 2022-02-02 LAB — PROTEIN ELECTROPHORESIS, SERUM
A/G Ratio: 0.9 (ref 0.7–1.7)
Albumin ELP: 3.3 g/dL (ref 2.9–4.4)
Alpha-1-Globulin: 0.2 g/dL (ref 0.0–0.4)
Alpha-2-Globulin: 0.6 g/dL (ref 0.4–1.0)
Beta Globulin: 0.9 g/dL (ref 0.7–1.3)
Gamma Globulin: 1.9 g/dL — ABNORMAL HIGH (ref 0.4–1.8)
Globulin, Total: 3.7 g/dL (ref 2.2–3.9)
Total Protein ELP: 7 g/dL (ref 6.0–8.5)

## 2022-02-02 NOTE — TOC Transition Note (Signed)
Transition of Care [facility]) - CM/SW Discharge Note   Patient Details  Name: Savannah Alvarez MRN: [id number] Date of Birth: August 19, 2000  Transition of Care [facility]) CM/SW Contact:  Pollie Friar, RN Phone Number: 02/02/2022, 12:02 PM   Clinical Narrative:    Recommendations for outpatient therapy. CM met with the patient and her mother. They prefer to attend the [facility]. Orders in Epic and information on the AVS.  Mother able to provide supervision at home and transportation to home.    Final next level of care: OP Rehab Barriers to Discharge: No Barriers Identified   Patient Goals and CMS Choice     Choice offered to / list presented to : Patient, Parent  Discharge Placement                       Discharge Plan and Services   Discharge Planning Services: CM Consult                                 Social Determinants of Health (SDOH) Interventions     Readmission Risk Interventions     No data to display

## 2022-02-02 NOTE — Progress Notes (Signed)
Occupational Therapy Treatment Patient Details Name: Savannah Alvarez MRN: [id number] DOB: [birth year] Today's Date: 02/02/2022   History of present illness Savannah Alvarez is a 21 y.o. 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   OT comments  Pt making great progress with all adls in the room. Pt with increased use of BUE during all adls. Pt toileted, groomed and fed self with no physical assist this am.  Pt planning on going on cruise next week.  Spoke with pt about safety on the ship, walking each day preferably when ship is at port, being aware of balance deficits she may feel when on land after being on ship and making sure someone is with her when she is up on her feet during this vacation. Pt progressing toward all goals.  Will focus on donning socks and home skills in standing at next session.    Recommendations for follow up therapy are one component of a multi-disciplinary discharge planning process, led by the attending physician.  Recommendations may be updated based on patient status, additional functional criteria and insurance authorization.    Follow Up Recommendations  Outpatient OT     Assistance Recommended at Discharge Intermittent Supervision/Assistance  Patient can return home with the following  A little help with walking and/or transfers;A little help with bathing/dressing/bathroom;Assistance with cooking/housework;Assist for transportation;Help with stairs or ramp for entrance   Equipment Recommendations  None recommended by OT    Recommendations for Other Services      Precautions / Restrictions Precautions Precautions: Fall Precaution Comments: WBAT and implementing hip precautions during session. Restrictions Weight Bearing Restrictions:  Yes RLE Weight Bearing: Weight bearing as tolerated Other Position/Activity Restrictions: Has been following posterior hip precautions. Nothing in regard to precautions in orders, but maintained during session for safety.       Mobility Bed Mobility Overal bed mobility: Modified Independent             General bed mobility comments: no physical assist needed.    Transfers Overall transfer level: Needs assistance Equipment used: Rolling walker (2 wheels) Transfers: Sit to/from Stand Sit to Stand: Supervision           General transfer comment: uses one hand on RW as comes to stand; assist off EOB and off standard toilet     Balance Overall balance assessment: Needs assistance Sitting-balance support: No upper extremity supported, Feet supported Sitting balance-Leahy Scale: Good     Standing balance support: Reliant on assistive device for balance Standing balance-Leahy Scale: Poor Standing balance comment: must have walker.                           ADL either performed or assessed with clinical judgement   ADL Overall ADL's : Needs assistance/impaired Eating/Feeding: Modified independent;Sitting   Grooming: Wash/dry hands;Wash/dry face;Oral care;Brushing hair;Supervision/safety;Standing Grooming Details (indicate cue type and reason): Pt stood at sink for appx 8 minutes leaning against sink for stability.  Recommended pt have one hand down when using other hand in case R knee buckles. Pt's knee did buckle on one occasion.  Pt felt water temp and was safe doing all adls in standing.                 Toilet Transfer: Supervision/safety;Ambulation;Comfort height toilet;Rolling walker (2 wheels);Grab  bars Toilet Transfer Details (indicate cue type and reason): Pt walked to bathroom with walker and supervision. Toileting- Clothing Manipulation and Hygiene: Supervision/safety;Sitting/lateral lean       Functional mobility during ADLs:  Supervision/safety;Rolling walker (2 wheels) General ADL Comments: Predominant concern of pt is weakness and numbness.    Extremity/Trunk Assessment Upper Extremity Assessment Upper Extremity Assessment: RUE deficits/detail;LUE deficits/detail RUE Deficits / Details: Pt with more functional use of RUE when doing adls, feeding self and toileting/grooming.  Pt continues to have decresed feeling but it is improving. RUE Sensation: decreased light touch RUE Coordination: decreased fine motor LUE Deficits / Details: Pt using LUE more during all adl tasks. Hands remain with decreased light touch although improved. LUE Sensation: decreased light touch LUE Coordination: decreased fine motor   Lower Extremity Assessment Lower Extremity Assessment: Defer to PT evaluation        Vision   Visual Fields: No apparent deficits   Perception Perception Perception: Within Functional Limits   Praxis Praxis Praxis: Intact    Cognition Arousal/Alertness: Awake/alert Behavior During Therapy: WFL for tasks assessed/performed Overall Cognitive Status: Within Functional Limits for tasks assessed                                 General Comments: following all hip precautions, A&Ox4 and problem solving well with how to do some adls differently given her weakness.        Exercises      Shoulder Instructions       General Comments Pt doing better with all adls and use of BUES.  Pt to go on cruise leaving next week. Talked to pt about making sure to always have someone with her when walking esp. when ship is moving as balance may be thrown off some. Encouraged pt to walk some each day and best time might be when ship is at port.  Pt mentioned attempting to get rollator so she would have a seat to sit on when walking. Pt will also take her w/c on cruise.  Talked about how cruise can make you feel unsteady even when you are on land and family will be with her at all times when  on her feet  on vacation.    Pertinent Vitals/ Pain       Pain Assessment Pain Assessment: Faces Faces Pain Scale: Hurts a little bit Pain Location: R hip Pain Descriptors / Indicators: Discomfort Pain Intervention(s): Monitored during session, Repositioned  Home Living                                          Prior Functioning/Environment              Frequency  Min 2X/week        Progress Toward Goals  OT Goals(current goals can now be found in the care plan section)  Progress towards OT goals: Progressing toward goals  Acute Rehab OT Goals Patient Stated Goal: to go on my cruise with my family. OT Goal Formulation: With patient Time For Goal Achievement: 02/13/22 Potential to Achieve Goals: Good ADL Goals Pt Will Perform Lower Body Dressing: with modified independence;with adaptive equipment;sit to/from stand;with caregiver independent in assisting Pt Will Transfer to Toilet: with modified independence;ambulating;regular height toilet Pt Will Perform Tub/Shower Transfer: Shower transfer;with modified independence;rolling walker;shower seat;ambulating Pt/caregiver will Perform  Home Exercise Program: Increased strength;Right Upper extremity;Left upper extremity;Both right and left upper extremity;With theraband;With written HEP provided;Independently;With theraputty Additional ADL Goal #1: Pt will identify 3+ fall prevention strategies to use in the home setting. Additional ADL Goal #2: Pt will verbalize compensatory techniques for decreased sensation in hands to avoid burn injuries during bathing/washing hands.  Plan Discharge plan remains appropriate    Co-evaluation                 AM-PAC OT "6 Clicks" Daily Activity     Outcome Measure   Help from another person eating meals?: None Help from another person taking care of personal grooming?: None Help from another person toileting, which includes using toliet, bedpan, or urinal?: A Little Help  from another person bathing (including washing, rinsing, drying)?: A Little Help from another person to put on and taking off regular upper body clothing?: A Little Help from another person to put on and taking off regular lower body clothing?: A Little 6 Click Score: 20    End of Session Equipment Utilized During Treatment: Rolling walker (2 wheels)  OT Visit Diagnosis: Unsteadiness on feet (R26.81);Muscle weakness (generalized) (M62.81);Other abnormalities of gait and mobility (R26.89);Pain Pain - Right/Left: Right Pain - part of body: Hip   Activity Tolerance Patient tolerated treatment well   Patient Left in chair;with call bell/phone within reach;with family/visitor present   Nurse Communication Mobility status        Time: 5374-8270 OT Time Calculation (min): 29 min  Charges: OT General Charges $OT Visit: 1 Visit OT Treatments $Self Care/Home Management : 23-37 mins   Hope Budds 02/02/2022, 10:29 AM

## 2022-02-02 NOTE — Progress Notes (Signed)
PROGRESS NOTE    Savannah Alvarez  WER:[id number] DOB: Aug 12, 2000 DOA: 01/28/2022 PCP: Remo Lipps, PA    Brief Narrative:   Savannah Alvarez is a 21 y.o. 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  Patient reports on October 30, she woke up with a tingling sensation in bilateral hands and feet which progressively worsened to all extremities.  She reports 7 falls since mid November and difficulty holding things in her hands such as utensils.  She was seen by neurology outpatient, Dr. Terrace Arabia and had EMG-NCS performed which confirmed acute demyelinating polyradiculoneuropathy, mild axonal component and she was directed to the ED for further evaluation and treatment with IVIG versus Plex and LP.  In the ED, temperature 98.1 F, HR 100, RR 16, BP 130/82, SpO2 100% on room air.  WBC 5.3, hemoglobin 10.7, platelets 457.  Sodium 134, potassium 3.4, chloride 1 3, CO2 22, glucose 85, BUN 17, creatinine 0.65.  hCG negative.  TSH 1.383.  RPR nonreactive.  Urinalysis unrevealing.  Neurology was consulted.  Patient underwent lumbar puncture.  TRH consulted for for admission for further evaluation management of AIDP/Guillain-Barr syndrome.  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  Patient has had been having progressive weakness/tingling of her extremities since October 30.  Patient underwent lumbar puncture on admission with WBC count 2, total protein 45, glucose 60.  Meningitis viral PCR panel negative.  RPR/HIV nonreactive.  CSF IgG elevated at 7.7. -- Neurology following, appreciate  assistance -- CSF culture: Gram stain with no organisms, no growth x 3 days -- Oligoclonal bands: Pending -- CSF VRDL: Pending -- SPEP: Pending -- Lyme's disease titer: Pending -- Vitamin B6: Pending -- Started on IVIG daily x 5 days; day #5/5 -- Await further recommendations from neurology  Hypokalemia Hypomagnesemia Repleted during hospitalization.  Right chronic hip pain/acetabular dysplasia Patient recently underwent right periacetabular osteotomy by orthopedic surgery at [facility] on 12/15/2021 by Dr. Rolanda Jay. -- Continue outpatient follow-up with orthopedics.  Severe protein calorie malnutrition Body mass index is 21.85 kg/m. Nutrition Status: Nutrition Problem: Severe Malnutrition Etiology: acute illness (GBS) Signs/Symptoms: mild fat depletion, moderate muscle depletion, percent weight loss (12.3% weight loss in < 5 months) Percent weight loss: 12.3 % Interventions: Ensure Enlive (each supplement provides 350kcal and 20 grams of protein), Magic cup -- Dietitian following, continue to encourage increased oral intake, supplementation   DVT prophylaxis: SCDs Start: 01/29/22 1950    Code Status: Full Code Family Communication: Updated mother present at bedside this morning  Disposition Plan:  Level of care: Progressive Status is: Inpatient Remains inpatient appropriate because: On IVIG x 5 days    Consultants:  Neurology  Procedures:  Lumbar puncture  Antimicrobials:  None   Subjective: Patient seen examined bedside, resting comfortably.  Lying in bed.  Present.  Reports paresthesias and weakness much improved, to receive last dose of IVIG today.  Awaiting for further neurology recommendations, family and patient hopeful for discharge later today.  No other specific questions or concerns at this time. Denies headache, no fever/chills/night sweats, no nausea/vomiting/diarrhea, no chest pain, no abdominal pain, no shortness of breath, no  cough/congestion.  No acute events overnight per nursing staff.  Objective:  Vitals:   02/01/22 2025 02/02/22 0022 02/02/22 0442 02/02/22 0735  BP:  124/71 118/66 112/65  Pulse:  85 81 78  Resp: 18  18 17   Temp: 99 F (37.2 C) 98.7 F (37.1 C) 98 F (36.7 C) 98.5 F (36.9 C)  TempSrc: Oral Oral Oral Oral  SpO2: 100% 100% 100% 100%  Weight:      Height:       No intake or output data in the 24 hours ending 02/02/22 1025  Filed Weights   01/29/22 0130 02/01/22 1942  Weight: 53.3 kg 54.2 kg    Examination:  Physical Exam: GEN: NAD, alert and oriented x 3, thin in appearance HEENT: NCAT, PERRL, EOMI, sclera clear, MMM PULM: CTAB w/o wheezes/crackles, normal respiratory effort, on room air CV: RRR w/o M/G/R GI: abd soft, NTND, NABS, no R/G/M MSK: no peripheral edema, muscle strength left upper/lower extremities and right upper/lower extremities 5/5 NEURO: CN II-XII intact, decreased sensation to light touch diminished bilateral upper extremities from mid lower arm to hand and lower extremities from mid shin distally but much improved and close to being back to her normal baseline PSYCH: normal mood/affect Integumentary: dry/intact, no rashes or wounds    Data Reviewed: I have personally reviewed following labs and imaging studies  CBC: Recent Labs  Lab 01/27/22 1107 01/28/22 1635 01/29/22 0921  WBC 4.0 5.3 2.9*  NEUTROABS 1.1*  --   --   HGB 11.8 10.7* 9.5*  HCT 36.2 34.4* 29.6*  MCV 79 82.1 81.5  PLT 501* 457* 343   Basic Metabolic Panel: Recent Labs  Lab 01/28/22 1635 01/29/22 0044 01/29/22 0921 01/30/22 0417 01/31/22 0304 02/01/22 0240  NA 134*  --  136 137 137 136  K 3.5  --  3.3* 3.4* 3.8 3.6  CL 103  --  106 106 106 104  CO2 22  --  23 24 23 23   GLUCOSE 85  --  98 91 98 88  BUN 17  --  13 10 <5* 6  CREATININE 0.65  --  0.66 0.61 0.62 0.62  CALCIUM 9.9  --  9.5 9.0 9.5 9.2  MG  --  1.6* 1.5* 1.7 1.6*  --   PHOS  --  3.3 3.8  --   --   --     GFR: Estimated Creatinine Clearance: 89.5 mL/min (by C-G formula based on SCr of 0.62 mg/dL). Liver Function Tests: Recent Labs  Lab 01/27/22 1107 01/29/22 0044 01/29/22 0125 01/29/22 0921  AST 38 23  --  21  ALT 21 17  --  17  ALKPHOS 85 56  --  50  BILITOT 0.7 0.4  --  0.6  PROT 8.6* 6.8  --  7.3  ALBUMIN 4.9 3.5 4.1 3.4*   No results for input(s): "LIPASE", "AMYLASE" in the last 168 hours. No results for input(s): "AMMONIA" in the last 168 hours. Coagulation Profile: No results for input(s): "INR", "PROTIME" in the last 168 hours. Cardiac Enzymes: Recent Labs  Lab 01/27/22 1107 01/29/22 0044  CKTOTAL 188* 106   BNP (last 3 results) No results for input(s): "PROBNP" in the last 8760 hours. HbA1C: No results for input(s): "HGBA1C" in the last 72 hours.  CBG: No results for input(s): "GLUCAP" in the last 168 hours. Lipid Profile: No results for input(s): "CHOL", "HDL", "LDLCALC", "TRIG", "CHOLHDL", "LDLDIRECT" in the last 72 hours. Thyroid Function Tests: No results for input(s): "TSH", "T4TOTAL", "FREET4", "T3FREE", "THYROIDAB" in the last 72 hours.  Anemia Panel:  No results for input(s): "VITAMINB12", "FOLATE", "FERRITIN", "TIBC", "IRON", "RETICCTPCT" in the last 72 hours.  Sepsis Labs: No results for input(s): "PROCALCITON", "LATICACIDVEN" in the last 168 hours.  Recent Results (from the past 240 hour(s))  CSF culture w Gram Stain     Status: None   Collection Time: 01/29/22  1:27 AM   Specimen: CSF; Cerebrospinal Fluid  Result Value Ref Range Status   Specimen Description CSF  Final   Special Requests LP  Final   Gram Stain NO WBC SEEN NO ORGANISMS SEEN   Final   Culture   Final    NO GROWTH 3 DAYS Performed at [facility], [address] [place], [place] [address]    Report Status 02/01/2022 FINAL  Final         Radiology Studies: No results found.      Scheduled Meds:  feeding supplement  237 mL Oral BID BM   multivitamin  with minerals  1 tablet Oral Daily   sertraline  50 mg Oral Daily   Continuous Infusions:  Immune Globulin 10% Stopped (02/01/22 1424)     LOS: 4 days    Time spent: 43 minutes spent on chart review, discussion with nursing staff, consultants, updating family and interview/physical exam; more than 50% of that time was spent in counseling and/or coordination of care.    Alvira Philips [place], DO [facility] Available via Epic secure chat 7am-7pm After these hours, please refer to coverage provider listed on [email or web address] 02/02/2022, 10:25 AM

## 2022-02-02 NOTE — Telephone Encounter (Addendum)
Chart reviewed, patient made significant improvement after hospital IVIG infusion, going to her preplanned cruise trip,  Please call patient and give her follow-up visit, start IVIG via [facility] long hospital  prior authorization process (was told by intra fusion).  May even consider home health IVIG arrangement if they take her insurance, but starting next round of infusion after she was reevaluated at clinic

## 2022-02-02 NOTE — Progress Notes (Addendum)
Neurology Progress Note  Brief HPI: 21 year old patient with history of eczema, intermittent hives, hip injury 3 years ago status post repair in October reported progressive weakness and numbness in all extremities since October 30.  She was seen outpatient by Dr. Mikey College and had an EMG which confirmed acute demyelinating polyradiculoneuropathy, and was sent to the ED for treatment with IVIG versus Plex.  Patient started treatment with IVIG and already reports some improvement in sensation and in her gait.  Subjective: Patient is receiving IVIG treatment #5.  Numbness and weakness significantly improved. Walking with assistance. Updated with pt and mom and RN at bedside  Exam: Vitals:   02/02/22 1355 02/02/22 1456  BP: 110/65 98/65  Pulse: 94 88  Resp: 20 20  Temp: 98.6 F (37 C) 98.5 F (36.9 C)  SpO2: 100% 100%   Gen: In bed, NAD, no distress Resp: non-labored breathing, FVC/NIF wnl  Neuro: Mental Status: Alert and oriented to person place time and situation Cranial Nerves: Pupils equal round and reactive, extraocular movements intact, face symmetrical, facial sensation symmetrical, phonation normal, hearing intact to voice, tongue midline Motor: 5/5 throughout BUE except 4+/5 tricep and grip on R. RLE 5/5 HF, 4+/5 KE, 4/5 KF, 4+/5 PF, 4/5 DF. LLE 5/5 throughout except 4+/5 KF and DF.  4 out of 5 strength with bilateral hip flexion, bilateral knee extension and knee flexion, 4 out of 5 strength in bilateral dorsiflexion and plantarflexion Sensory: Improved to LT x 4 extrem DTR: Unable to elicit BLE patella, trace achilles noted,  1+ BUE Gait: Deferred  Pertinent Labs:    Latest Ref Rng & Units 01/29/2022    9:21 AM 01/28/2022    4:35 PM 01/27/2022   11:07 AM  CBC  WBC 4.0 - 10.5 K/uL 2.9  5.3  4.0   Hemoglobin 12.0 - 15.0 g/dL 9.5  10.6  26.9   Hematocrit 36.0 - 46.0 % 29.6  34.4  36.2   Platelets 150 - 400 K/uL 343  457  501        Latest Ref Rng & Units 02/01/2022    2:40  AM 01/31/2022    3:04 AM 01/30/2022    4:17 AM  BMP  Glucose 70 - 99 mg/dL 88  98  91   BUN 6 - 20 mg/dL 6  <5  10   Creatinine 0.44 - 1.00 mg/dL 4.85  4.62  7.03   Sodium 135 - 145 mmol/L 136  137  137   Potassium 3.5 - 5.1 mmol/L 3.6  3.8  3.4   Chloride 98 - 111 mmol/L 104  106  106   CO2 22 - 32 mmol/L 23  23  24    Calcium 8.9 - 10.3 mg/dL 9.2  9.5  9.0      Imaging Reviewed: None performed  Assessment: 21 year old female with history of eczema, hives and hip injury presents with worsening ascending weakness and numbness in all extremities since October 30.  She was seen outpatient, had an EMG and was discovered to have acute demyelinating polyradiculoneuropathy (AIDP).  She has presented here for treatment with IVIG and is taking her fifth dose today.  Weakness significantly improved. Lumbar puncture performed and shows no red blood cells on fourth tube but a protein of 45 (borderline value at upper limit of normal). IgG index abnormally elevated. FVC/NIF wnl and imprved.  Impression: GBS, improving significantly with IVIG  Recommendations: 1) Complete IVIG 0.4 g/kg #5 today 2) NIF and vital capacity every 12 hours  3) PT/OT 4) She has improved significantly on IVIG and has had no respiratory issues whatsoever. Her family is planning on taking a cruise one week after hospital discharge and really still wants to go on the trip. Given her remarkable improvement I told them I felt if they wished to go then it would be reasonable to do so as long as they notify the ship medical staff and make arrangements for wheelchair/walker and of course change the plan if she has any decline whatsoever. We will s/o. Please call back if needed. Out pt neuro f/u with [facility] in 1 mo  Desiree Metzger-Cihelka, ARNP-C, ANVP-BC [facility] If 7pm- 7am, please page neurology on call as listed in AMION.  Electronically signed: Dr. Caryl Pina

## 2022-02-02 NOTE — Progress Notes (Signed)
Physical Therapy Treatment Patient Details Name: Savannah Alvarez MRN: [id number] DOB: [birth year] Today's Date: 02/02/2022   History of Present Illness Semiyah Trolinger is a 21 y.o. 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    PT Comments    Pt greeted up in chair on arrival and agreeable to session with good progress, however pt continues to be limited by fatigue and weakness. Pt able to demonstrate increased tolerance for gait, although pt fatigues quickly and with c/o nausea throughout, pt giving good effort throughout session. Pt able to ascend/descend 2 steps in therapy gym with no LOB and fair recall of sequencing. Pt with no c/o dizziness t/o session. Anticipate safe discharge with family support once medically cleared, will follow acutely.    Recommendations for follow up therapy are one component of a multi-disciplinary discharge planning process, led by the attending physician.  Recommendations may be updated based on patient status, additional functional criteria and insurance authorization.  Follow Up Recommendations  Outpatient PT     Assistance Recommended at Discharge Intermittent Supervision/Assistance  Patient can return home with the following Help with stairs or ramp for entrance;Assistance with cooking/housework;Assist for transportation   Equipment Recommendations  None recommended by PT    Recommendations for Other Services       Precautions / Restrictions Precautions Precautions: Fall Precaution Comments: WBAT and implementing hip precautions during session. Restrictions Weight Bearing Restrictions: Yes RLE Weight Bearing: Weight bearing as tolerated Other Position/Activity Restrictions: Has been following posterior hip precautions. Nothing in  regard to precautions in orders, but maintained during session for safety.     Mobility  Bed Mobility Overal bed mobility: Modified Independent             General bed mobility comments: OOB in recliner pre and post session    Transfers Overall transfer level: Needs assistance Equipment used: Rolling walker (2 wheels) Transfers: Sit to/from Stand Sit to Stand: Supervision, Min guard           General transfer comment: uses one hand on RW as comes to stand; min guard to stand from rollator for safety    Ambulation/Gait Ambulation/Gait assistance: Min guard Gait Distance (Feet): 100 Feet Assistive device: Rolling walker (2 wheels) Gait Pattern/deviations: Step-to pattern, Decreased stride length, Decreased dorsiflexion - right, Decreased dorsiflexion - left, Wide base of support, Ataxic Gait velocity: decr     General Gait Details: moderate UE reliance, no c/o dizziness this date, limited 2/2 nausea/feeling hot   Stairs Stairs: Yes Stairs assistance: Min guard Stair Management: Two rails, Step to pattern, Forwards Number of Stairs: 2 General stair comments: no LOB this date, light cues on descent for sequence, good recall for sequencing on ascent   Wheelchair Mobility    Modified Rankin (Stroke Patients Only)       Balance Overall balance assessment: Needs assistance Sitting-balance support: No upper extremity supported, Feet supported Sitting balance-Leahy Scale: Good     Standing balance support: Reliant on assistive device for balance Standing balance-Leahy Scale: Poor Standing balance comment: must have walker.                            Cognition Arousal/Alertness: Awake/alert Behavior During Therapy: WFL for tasks assessed/performed Overall Cognitive Status: Within Functional Limits for tasks assessed  General Comments: following all hip precautions, A&Ox4 and problem solving well         Exercises      General Comments General comments (skin integrity, edema, etc.): Pt doing better with all adls and use of BUES.  Pt to go on cruise leaving next week. Talked to pt about making sure to always have someone with her when walking esp. when ship is moving as balance may be thrown off some. Encouraged pt to walk some each day and best time might be when ship is at port.  Pt mentioned attempting to get rollator so she would have a seat to sit on when walking. Pt will also take her w/c on cruise.  Talked about how cruise can make you feel unsteady even when you are on land and family will be with her at all times when  on her feet on vacation.      Pertinent Vitals/Pain Pain Assessment Pain Assessment: Faces Faces Pain Scale: Hurts a little bit Pain Location: R hip Pain Descriptors / Indicators: Discomfort Pain Intervention(s): Monitored during session    Home Living                          Prior Function            PT Goals (current goals can now be found in the care plan section) Acute Rehab PT Goals PT Goal Formulation: With patient/family Time For Goal Achievement: 02/13/22 Progress towards PT goals: Progressing toward goals    Frequency    Min 4X/week      PT Plan Current plan remains appropriate    Co-evaluation              AM-PAC PT "6 Clicks" Mobility   Outcome Measure  Help needed turning from your back to your side while in a flat bed without using bedrails?: None Help needed moving from lying on your back to sitting on the side of a flat bed without using bedrails?: None Help needed moving to and from a bed to a chair (including a wheelchair)?: A Little Help needed standing up from a chair using your arms (e.g., wheelchair or bedside chair)?: A Little Help needed to walk in hospital room?: A Little Help needed climbing 3-5 steps with a railing? : A Little 6 Click Score: 20    End of Session Equipment Utilized During  Treatment: Gait belt Activity Tolerance: Patient tolerated treatment well;Other (comment) (limited by nausea) Patient left: Other (comment);with call bell/phone within reach;with family/visitor present (in bathroom, pt and mother verbalizing understanding of insturction to bull cord when done) Nurse Communication: Mobility status PT Visit Diagnosis: Other abnormalities of gait and mobility (R26.89);Muscle weakness (generalized) (M62.81);Other symptoms and signs involving the nervous system (Z61.096)     Time: 0454-0981 PT Time Calculation (min) (ACUTE ONLY): 22 min  Charges:  $Gait Training: 8-22 mins                      R. PTA Acute Rehabilitation Services Office: [phone]    Catalina Antigua 02/02/2022, 11:08 AM

## 2022-02-02 NOTE — Progress Notes (Signed)
VC: 2.9L NIF: greater than -40  With excellent patient effort.

## 2022-02-02 NOTE — Plan of Care (Signed)
All education completed with pt & pt's mother at bedside including discharge instructions.Emotional support given. All needs met.

## 2022-02-02 NOTE — Discharge Summary (Signed)
Physician Discharge Summary  [name] IDP:[id number] DOB: 2000/10/02 DOA: 01/28/2022  PCP: Remo Lipps, PA  Admit date: 01/28/2022 Discharge date: 02/02/2022  Admitted From: Home Disposition: Home  Recommendations for Outpatient Follow-up:  Follow up with PCP in 1-2 weeks Follow-up with [facility] in 1 week Follow-up with orthopedics as scheduled   Home Health: Outpatient PT/OT Equipment/Devices: None  Discharge Condition: Stable CODE STATUS: Full code Diet recommendation: Regular diet  History of present illness:  Savannah Alvarez is a 21 y.o. 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  Patient reports on October 30, she woke up with a tingling sensation in bilateral hands and feet which progressively worsened to all extremities.  She reports 7 falls since mid November and difficulty holding things in her hands such as utensils.  She was seen by neurology outpatient, Dr. Terrace Arabia and had EMG-NCS performed which confirmed acute demyelinating polyradiculoneuropathy, mild axonal component and she was directed to the ED for further evaluation and treatment with IVIG versus Plex and LP.   In the ED, temperature 98.1 F, HR 100, RR 16, BP 130/82, SpO2 100% on room air.  WBC 5.3, hemoglobin 10.7, platelets 457.  Sodium 134, potassium 3.4, chloride 1 3, CO2 22, glucose 85, BUN 17, creatinine 0.65.  hCG negative.  TSH 1.383.  RPR nonreactive.  Urinalysis unrevealing.  Neurology was consulted.  Patient underwent lumbar puncture.  TRH consulted for for admission for further evaluation management of AIDP/Guillain-Barr syndrome.  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  Patient has had been having progressive weakness/tingling of her extremities since October 30.  Patient underwent lumbar puncture on admission with WBC count 2, total protein 45, glucose 60.  Meningitis viral PCR panel negative.  RPR/HIV nonreactive.  CSF IgG elevated at 7.7.  Neurology was consulted and followed during hospital course.  Patient underwent lumbar puncture with Gram stain no organisms and no growth for 3 days while hospitalized.  Patient was started on IVIG and completed 5-day course with marked improvement in symptoms.  Patient was started on IVIG and completed 5-day course with marked improvement in symptoms. Oligoclonal bands, CSF VRDL, SPEP, Lyme's disease titer, Vitamin B6, are all pending at time of discharge.  Outpatient follow-up with [facility] in 1 week.   Hypokalemia Hypomagnesemia Repleted during hospitalization.   Right chronic hip pain/acetabular dysplasia Patient recently underwent right periacetabular osteotomy by orthopedic surgery at [facility] on 12/15/2021 by Dr. Rolanda Jay. Continue outpatient follow-up with orthopedics.   Severe protein calorie malnutrition Body mass index is 21.85 kg/m. Nutrition Status: Nutrition Problem: Severe Malnutrition Etiology: acute illness (GBS) Signs/Symptoms: mild fat depletion, moderate muscle depletion, percent weight loss (12.3% weight loss in < 5 months) Percent weight loss: 12.3 % Interventions: Ensure Enlive (each supplement provides 350kcal and 20 grams of protein), Magic cup -- Dietitian was consulted during hospitalization, continue to encourage increased oral intake, supplementation  Discharge Diagnoses:  Principal Problem:   Guillain Barr syndrome (HCC) Active Problems:   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HCC)   Anemia   Protein-calorie  malnutrition, severe    Discharge Instructions  Discharge Instructions     Ambulatory referral to Occupational Therapy   Complete by:  As directed    Ambulatory referral to Physical Therapy   Complete by: As directed    Call MD for:  difficulty breathing, headache or visual disturbances   Complete by: As directed    Call MD for:  extreme fatigue   Complete by: As directed    Call MD for:  persistant dizziness or light-headedness   Complete by: As directed    Call MD for:  persistant nausea and vomiting   Complete by: As directed    Call MD for:  severe uncontrolled pain   Complete by: As directed    Call MD for:  temperature >100.4   Complete by: As directed    Diet - low sodium heart healthy   Complete by: As directed    Increase activity slowly   Complete by: As directed    No wound care   Complete by: As directed       Allergies as of 02/02/2022       Reactions   Other Swelling   Onions, Cantaloupe & tomato        Medication List     STOP taking these medications    cetirizine 10 MG tablet Commonly known as: ZYRTEC   diclofenac Sodium 1 % Gel Commonly known as: Voltaren   gabapentin 100 MG capsule Commonly known as: Neurontin   meloxicam 7.5 MG tablet Commonly known as: Mobic   montelukast 10 MG tablet Commonly known as: SINGULAIR   rizatriptan 5 MG disintegrating tablet Commonly known as: Maxalt-MLT   tiZANidine 4 MG tablet Commonly known as: Zanaflex       TAKE these medications    acetaminophen 500 MG tablet Commonly known as: TYLENOL Take 500 mg by mouth every 6 (six) hours as needed for moderate pain.   diazepam 5 MG tablet Commonly known as: VALIUM Take 5 mg by mouth every 8 (eight) hours as needed for anxiety.   famotidine 20 MG tablet Commonly known as: PEPCID Take 20 mg by mouth daily as needed for heartburn or indigestion.   omalizumab 150 MG/ML prefilled syringe Commonly known as: XOLAIR Inject 300 mg into the skin  every 28 (twenty-eight) days.   ondansetron 4 MG tablet Commonly known as: ZOFRAN Take 4 mg by mouth every 8 (eight) hours as needed for nausea or vomiting.   oxyCODONE 15 MG immediate release tablet Commonly known as: ROXICODONE Take 7.5 mg by mouth every 4 (four) hours as needed for pain.   sertraline 50 MG tablet Commonly known as: ZOLOFT Take 50 mg by mouth daily.        Follow-up Information     [facility] Schedule an appointment as soon as possible for a visit in 1 week(s).   Specialty: Rehabilitation Contact information: [address] [id number] [place] [id number] [phone]        [facility] Follow up in 1 month(s).   Specialty: Neurology Why: Out pt f/uGBS s/p IVIg x5d Contact information: [address] [place] [id number] [phone]               Allergies  Allergen Reactions   Other Swelling    Uvaldo Risingnions, Cantaloupe & tomato    Consultations: Neurology   Procedures/Studies: NCV with EMG(electromyography)  Result Date: 01/28/2022 Levert FeinsteinYan, Yijun, MD     01/28/2022  2:34 PM     Full Name: Savannah SnipeKayla Mazon Gender: Female MRN #: [id number] Date of Birth: 05/02/2000   Visit Date: 01/28/2022  12:48 Age: 80 Years Examining Physician: Dr. Levert Feinstein Referring Physician: Dr. Frances Furbish Height: 5 feet 2 inch History: 21 year old female status post right periacetabular osteotomy on December 15, 2021, developed acute onset of bilateral upper and lower extremity numbness and weakness a week after, reached nadir about 4 weeks after symptoms onset, not still have persistent distal weakness numbness, fell multiple times Summary of the Test: Nerve conduction study: Right sural, superficial peroneal, ulnar, median sensory responses were absent. Right tibial, peroneal to EDB and ulnar motor responses were absent. Electromyography: Selected needle examination of right  upper, lower extremity muscles, right cervical and lumbosacral paraspinal muscles; right facial muscle performed. There is evidence of increased insertional activity, mild spontaneous activity at distal upper and lower extremity muscles, with complex large motor unit potential decreased recruitment patterns. There is also evidence of neuropathic changes involving right orbicularis oculi and frontalis muscles. There is also evidence of increased insertional activity, polyphasic complex motor unit potential at right lumbosacral paraspinals, suggestive of remodeling process. Conclusion: This is an abnormal study.  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 ------------------------------- Levert Feinstein M.D. PhD [facility] [address], [address] [place], [place] [address] Tel: [phone] Fax: [phone] Verbal informed consent was obtained from the patient, patient was informed of potential risk of procedure, including bruising, bleeding, hematoma formation, infection, muscle weakness, muscle pain, numbness, among others.     MNC   Nerve / Sites Muscle Latency Ref. Amplitude Ref. Rel Amp Segments Distance Ref. Area   ms ms mV mV %  cm m/s mVms R Ulnar - ADM    Wrist ADM 21.9 ?3.3 0.1 ?6.0 100 Wrist - ADM 7      B.Elbow ADM 21.5  1.4  1825 B.Elbow - Wrist  ?49     A.Elbow ADM      A.Elbow - B.Elbow  ?49  R Peroneal - EDB    Ankle EDB NR ?6.5 NR ?2.0 NR Ankle - EDB 9  NR        Pop fossa - Ankle    R Tibial - AH    Ankle AH NR ?5.8 NR ?4.0 NR Ankle - AH 9  NR           SNC   Nerve / Sites Rec. Site Peak Lat Ref.  Amp Ref. Segments Distance   ms ms V V  cm R Radial - Anatomical snuff box (Forearm)    Forearm Wrist 2.7 ?2.9 5 ?15 Forearm - Wrist 10 R Sural - Ankle (Calf)    Calf Ankle NR ?4.4 NR ?6 Calf - Ankle 14 R Superficial peroneal - Ankle    Lat leg Ankle NR ?4.4 NR ?6 Lat leg - Ankle 14 R Median -  Orthodromic (Dig II, Mid palm)    Dig II Wrist NR ?3.4 NR ?10 Dig II - Wrist 13 R Ulnar - Orthodromic, (Dig V, Mid palm)    Dig V Wrist NR ?3.1 NR ?5 Dig V - Wrist 11             EMG Summary Table   Spontaneous MUAP Recruitment Muscle IA Fib PSW Fasc Other Amp Dur. Poly Pattern R. Tibialis anterior Increased 1+ None None _______ Increased Increased 1+ Reduced R. Tibialis posterior Increased 1+ None None _______ Increased Increased 1+ Reduced R. Peroneus longus Increased None None None _______ Increased Increased 1+ Reduced R. Gastrocnemius (Medial head) Increased None None None _______ Normal Normal Normal  Reduced R. Vastus lateralis Normal None None None _______ Normal Normal Normal Reduced R. Lumbar paraspinals (low) Normal None None None _______ Normal Normal Normal Normal R. Lumbar paraspinals (mid) Normal None None None _______ Normal Normal Normal Normal R. First dorsal interosseous Increased 1+ None None _______ Increased Increased 1+ Reduced R. Abductor digiti minimi (manus) Increased 1+ None None _______ Increased Increased 1+ Reduced R. Brachioradialis Increased None None None _______ Normal Normal Normal Reduced R. Biceps brachii Normal None None None _______ Normal Normal Normal Reduced R. Deltoid Normal None None None _______ Normal Normal Normal Reduced R. Triceps brachii Normal None None None _______ Normal Normal Normal Reduced R. Cervical paraspinals Normal None None None _______ Normal Normal Normal Normal R. Frontalis Increased None None None _______ Increased Increased 1+ Reduced R. Orbicularis oculi Increased None None None _______ Increased Increased 1+ Reduced      Subjective: Patient seen examined bedside, resting calmly.  Mother present.  Sensation and weakness has markedly improved.  Completed 5 days of IVIG today.  Seen by neurology and okay for discharge home with outpatient follow-up in 1 week.  No other complaints or concerns at this time.  Denies headache, no dizziness, no chest  pain, palpitations, no shortness of breath, no abdominal pain, no focal weakness, no fatigue, no fever/chills/night sweats, no nausea/vomiting/diarrhea.  Discharge Exam: Vitals:   02/02/22 1355 02/02/22 1456  BP: 110/65 98/65  Pulse: 94 88  Resp: 20 20  Temp: 98.6 F (37 C) 98.5 F (36.9 C)  SpO2: 100% 100%   Vitals:   02/02/22 1225 02/02/22 1249 02/02/22 1355 02/02/22 1456  BP: 112/67 110/66 110/65 98/65  Pulse: 80 86 94 88  Resp: 20 20 20 20   Temp: 98.6 F (37 C) 98.6 F (37 C) 98.6 F (37 C) 98.5 F (36.9 C)  TempSrc: Oral Oral Oral Oral  SpO2: 100% 100% 100% 100%  Weight:      Height:        Physical Exam: GEN: NAD, alert and oriented x 3, thin in appearance HEENT: NCAT, PERRL, EOMI, sclera clear, MMM PULM: CTAB w/o wheezes/crackles, normal respiratory effort CV: RRR w/o M/G/R GI: abd soft, NTND, NABS, no R/G/M MSK: no peripheral edema, muscle strength globally intact 5/5 bilateral upper/lower extremities NEURO: CN II-XII intact, no focal deficits, sensation to light touch slightly decreased bilateral hands and feet but markedly improved following IVIG. PSYCH: normal mood/affect Integumentary: dry/intact, no rashes or wounds    The results of significant diagnostics from this hospitalization (including imaging, microbiology, ancillary and laboratory) are listed below for reference.     Microbiology: Recent Results (from the past 240 hour(s))  CSF culture w Gram Stain     Status: None   Collection Time: 01/29/22  1:27 AM   Specimen: CSF; Cerebrospinal Fluid  Result Value Ref Range Status   Specimen Description CSF  Final   Special Requests LP  Final   Gram Stain NO WBC SEEN NO ORGANISMS SEEN   Final   Culture   Final    NO GROWTH 3 DAYS Performed at [facility], [address] [place], [place]    Report Status 02/01/2022 FINAL  Final     Labs: BNP (last 3 results) No results for input(s): "BNP" in the last 8760 hours. Basic  Metabolic Panel: Recent Labs  Lab 01/28/22 1635 01/29/22 0044 01/29/22 0921 01/30/22 0417 01/31/22 0304 02/01/22 0240  NA 134*  --  136 137 137 136  K 3.5  --  3.3* 3.4* 3.8 3.6  CL 103  --  106 106 106 104  CO2 22  --  GLUCOSE 85  --  98 91 98 88  BUN 17  --  13 10 <5* 6  CREATININE 0.65  --  0.66 0.61 0.62 0.62  CALCIUM 9.9  --  9.5 9.0 9.5 9.2  MG  --  1.6* 1.5* 1.7 1.6*  --   PHOS  --  3.3 3.8  --   --   --    Liver Function Tests: Recent Labs  Lab 01/27/22 1107 01/29/22 0044 01/29/22 0125 01/29/22 0921  AST 38 23  --  21  ALT 21 17  --  17  ALKPHOS 85 56  --  50  BILITOT 0.7 0.4  --  0.6  PROT 8.6* 6.8  --  7.3  ALBUMIN 4.9 3.5 4.1 3.4*   No results for input(s): "LIPASE", "AMYLASE" in the last 168 hours. No results for input(s): "AMMONIA" in the last 168 hours. CBC: Recent Labs  Lab 01/27/22 1107 01/28/22 1635 01/29/22 0921  WBC 4.0 5.3 2.9*  NEUTROABS 1.1*  --   --   HGB 11.8 10.7* 9.5*  HCT 36.2 34.4* 29.6*  MCV 79 82.1 81.5  PLT 501* 457* 343   Cardiac Enzymes: Recent Labs  Lab 01/27/22 1107 01/29/22 0044  CKTOTAL 188* 106   BNP: Invalid input(s): "POCBNP" CBG: No results for input(s): "GLUCAP" in the last 168 hours. D-Dimer No results for input(s): "DDIMER" in the last 72 hours. Hgb A1c No results for input(s): "HGBA1C" in the last 72 hours. Lipid Profile No results for input(s): "CHOL", "HDL", "LDLCALC", "TRIG", "CHOLHDL", "LDLDIRECT" in the last 72 hours. Thyroid function studies No results for input(s): "TSH", "T4TOTAL", "T3FREE", "THYROIDAB" in the last 72 hours.  Invalid input(s): "FREET3" Anemia work up No results for input(s): "VITAMINB12", "FOLATE", "FERRITIN", "TIBC", "IRON", "RETICCTPCT" in the last 72 hours. Urinalysis    Component Value Date/Time   COLORURINE YELLOW 01/28/2022 1705   APPEARANCEUR HAZY (A) 01/28/2022 1705   LABSPEC 1.026 01/28/2022 1705   PHURINE 5.0 01/28/2022 1705   GLUCOSEU NEGATIVE  01/28/2022 1705   HGBUR NEGATIVE 01/28/2022 1705   BILIRUBINUR NEGATIVE 01/28/2022 1705   KETONESUR 80 (A) 01/28/2022 1705   PROTEINUR 30 (A) 01/28/2022 1705   NITRITE NEGATIVE 01/28/2022 1705   LEUKOCYTESUR NEGATIVE 01/28/2022 1705   Sepsis Labs Recent Labs  Lab 01/27/22 1107 01/28/22 1635 01/29/22 0921  WBC 4.0 5.3 2.9*   Microbiology Recent Results (from the past 240 hour(s))  CSF culture w Gram Stain     Status: None   Collection Time: 01/29/22  1:27 AM   Specimen: CSF; Cerebrospinal Fluid  Result Value Ref Range Status   Specimen Description CSF  Final   Special Requests LP  Final   Gram Stain NO WBC SEEN NO ORGANISMS SEEN   Final   Culture   Final    NO GROWTH 3 DAYS Performed at [facility], [address], [place], [place] [address]    Report Status 02/01/2022 FINAL  Final     Time coordinating discharge: Over 30 minutes  SIGNED:   Alvira Philips [place], DO  [facility] 02/02/2022, 3:59 PM

## 2022-02-03 LAB — VDRL, CSF: VDRL Quant, CSF: NONREACTIVE

## 2022-02-03 LAB — OLIGOCLONAL BANDS, CSF + SERM

## 2022-02-03 NOTE — Telephone Encounter (Signed)
I called pt and spoke with her mom.  She reports the pt is doing well and she is planning to attend her cruise on 12/18-12/23. I advise we would like to see her before her cruise departs and f/u scheduled for 02/05/2022 at 1230 pm.  She verbalized understanding and appreciation for the call.

## 2022-02-04 LAB — LYME DISEASE DNA BY PCR(BORRELIA BURG): Lyme Disease(B.burgdorferi)PCR: NEGATIVE

## 2022-02-05 ENCOUNTER — Telehealth: Payer: Self-pay | Admitting: Neurology

## 2022-02-05 ENCOUNTER — Encounter: Payer: Self-pay | Admitting: Neurology

## 2022-02-05 ENCOUNTER — Ambulatory Visit: Payer: Medicaid Other | Admitting: Neurology

## 2022-02-05 VITALS — BP 117/73 | HR 75 | Ht 62.0 in | Wt 119.0 lb

## 2022-02-05 DIAGNOSIS — G610 Guillain-Barre syndrome: Secondary | ICD-10-CM | POA: Diagnosis not present

## 2022-02-05 DIAGNOSIS — R269 Unspecified abnormalities of gait and mobility: Secondary | ICD-10-CM | POA: Diagnosis not present

## 2022-02-05 NOTE — Telephone Encounter (Signed)
Will start the beginning of next week.

## 2022-02-05 NOTE — Telephone Encounter (Signed)
Please start ivig at [facility], on the week of December 25    1g/kg,divided in 2  days.x every 3 weeks, for total of 6 dosage.  Return in 2 months

## 2022-02-05 NOTE — Progress Notes (Addendum)
Chief Complaint  Patient presents with   Follow-up    Rm 15 with mom. Here for f/u reports she is doing well, no complaints.       ASSESSMENT AND PLAN  Savannah Alvarez is a 21 y.o. 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, mild axonal component,  She received inpatient IVIG 2 g/kg from January 28, 2010 2023, reported significant improvement, Spinal fluid testing January 29, 2022 showed normal total protein of 45, elevated IgG index  She is still at the peak of the benefit from her first round of IVIG treatment, she has moderate distal weakness, gait abnormality, sensory loss, need further treatment, start outpatient IVIG preauthorization  Return to clinic in 2 months  Anemia  Repeat laboratory evaluations  DIAGNOSTIC DATA (LABS, IMAGING, TESTING) - I reviewed patient records, labs, notes, testing and imaging myself where available.  Laboratory evaluation January 27, 2022, normal or negative RPR, TSH, ANA, B6, vitamin D, A1c, rheumatoid factor, B1 CPK slightly elevated 188, repeat CMP showed normal liver functional test, calcium was mildly elevated 10.5, ESR was elevated 45, hemoglobin has improved to 11.8  Laboratory evaluation from [facility] February 24, 2022, ferritin 65, iron level 42, hemoglobin 10.2, creatinine 0.6, otherwise normal CMP, MEDICAL HISTORY:  Savannah Alvarez is a 21 years old female, accompanied by her mother, referred by Dr. Rexene Alberts for electrodiagnostic study  for subacute onset weakness, numbness.    I reviewed and summarized the referring note. PMHX.  She had right hip surgery periacetabular osteotomy at [facility] on December 15, 2021, initially recovered well, pain was well controlled  Second week around December 22, 2021 woke up noticed numbness tingling and numbness of shooting pain at bilateral lower extremity from ankle down, bilateral hands from the wrist down, she also noticed clumsiness of right hand, could not hold her crutches well, fell multiple times since then  Was treated at [facility] emergency room on December 28, 2021, also with complaints of shortness of breath, facial weakness, was found to have mild anemia hemoglobin of 10, mild elevation of AST 84 ALT of 112, magnesium was decreased 1.8, vitamin D level was less than 8  She was given magnesium supplement,  CT PE was negative  EKG showed sinus rhythm, heart rate of 96 bpm  Follow-up visit with orthopedic surgeon December 30, 2021 showed postsurgical change of right periacetabular osteotomy, no new fracture, no hardware complications,  Orthopedic follow-up again January 13, 2022, with complaints of worsening upper and lower extremity weakness, numbness, also developed facial weakness, was given the diagnosis of Bell's palsy, need eyedrop to close her eyes, fell several times, no longer needing narcotics for pain control,  Patient reported her worst time was before Thanksgiving, when she has shortness of breath, palpitation, difficulty closing her eyes, profound bilateral upper and lower extremity weakness, numbness, since then, she seems to have made slow progress, still have numbness, but not as dense, not as painful, continue to have weakness, she can close her eyes a little bit better,  Electrodiagnostic study January 28, 2022 confirm her diagnosis of acute  demyelinating polyradiculoneuropathy, mainly demyelinating, with mild axonal component  UPDATE Feb 05 2022: She was  admitted to hospital December 6 through 11, 2023 with diagnosis of AIDP, Lumbar puncture January 29, 2022: Normal total protein 45, glucose 60, elevated CSF IgG, IgG/albumin ratio, IgG index, WBC of 2  She was started on IVIG, began to notice improvement 2 days after infusion, continue to progress, she now ambulated with a walker, but still have gait abnormality, planning on family cruise December 17 to 23, 2023 She denied chewing difficulty, no swallowing or breathing difficulties  PHYSICAL EXAM:   Vitals:   02/05/22 1219  BP: 117/73  Pulse: 75  Weight: 119 lb (54 kg)  Height: _0  (1.575 m)   Body mass index is 21.77 kg/m.  PHYSICAL EXAMNIATION:  Gen: NAD, conversant, well nourised, well groomed                     Cardiovascular: Regular rate rhythm, no peripheral edema, warm, nontender. Eyes: Conjunctivae clear without exudates or hemorrhage Neck: Supple, no carotid bruits. Pulmonary: Clear to auscultation bilaterally, good air movement at the base of bilateral lung  NEUROLOGICAL EXAM:  MENTAL STATUS: Speech/cognition: Tearful at today's clinical visit CRANIAL NERVES: CN II: Visual fields are full to confrontation. Pupils are round equal and briskly reactive to light. CN III, IV, VI: extraocular movement are normal. No ptosis. CN V: Facial sensation is intact to light touch CN VII: Face is symmetric with normal eye closure  CN VIII: Hearing is normal to causal conversation. CN IX, X: Phonation is normal. CN XI: Head turning and shoulder shrug are intact  MOTOR: Proximal upper extremity motor strength is normal, mild bilateral handgrip, mild to moderate finger flexion extension weakness.  Right lower extremity motor strength is limited due to recent right hip surgery, mild left hip flexion weakness, moderate left ankle dorsiflexion and plantarflexion weakness  REFLEXES: Areflexia  SENSORY: Length-dependent decreased light touch, pinprick, vibratory sensation to mid  shin level, absent toe proprioception, Decreased finger vibratory sensation, and finger proprioception  COORDINATION: There is no trunk or limb dysmetria noted.  GAIT/STANCE: Need help to get up from seated position, rely on her walker, very unsteady  REVIEW OF SYSTEMS:  Full 14 system review of systems performed and notable only for as above All other review of systems were negative.   ALLERGIES: Allergies  Allergen Reactions   Other Swelling    Onions, Cantaloupe & tomato    HOME MEDICATIONS: Current Outpatient Medications  Medication Sig Dispense Refill   acetaminophen (TYLENOL) 500 MG tablet Take 500 mg by mouth every 6 (six) hours as needed for moderate pain.     diazepam (VALIUM) 5 MG tablet Take 5 mg by mouth every 8 (eight) hours as needed for anxiety.     famotidine (PEPCID) 20 MG tablet Take 20 mg by mouth daily as needed for heartburn or indigestion.     omalizumab Arvid Right) 150 MG/ML prefilled syringe Inject 300 mg into the skin every 28 (twenty-eight) days.     ondansetron (ZOFRAN) 4 MG tablet Take 4 mg by mouth every 8 (eight) hours as needed for nausea or vomiting.     oxyCODONE (ROXICODONE) 15 MG immediate release tablet Take 7.5 mg by mouth every 4 (four) hours as needed for pain.     sertraline (ZOLOFT) 50 MG tablet Take 50 mg by mouth daily.     No current facility-administered medications for this visit.    PAST MEDICAL HISTORY: Past Medical  History:  Diagnosis Date   Eczema     PAST SURGICAL HISTORY: Past Surgical History:  Procedure Laterality Date   HIP SURGERY Right     FAMILY HISTORY: Family History  Problem Relation Age of Onset   Hypertension Father    Diabetes Father    Hyperthyroidism Mother    Diabetes Maternal Grandmother    Hypertension Maternal Grandmother    Diabetes Maternal Grandfather    Hypertension Maternal Grandfather     SOCIAL HISTORY: Social History   Socioeconomic History   Marital status: Single    Spouse name:  Not on file   Number of children: 0   Years of education: Not on file   Highest education level: Some college, no degree  Occupational History    Comment: student in college  Tobacco Use   Smoking status: Never    Passive exposure: Never   Smokeless tobacco: Never  Vaping Use   Vaping Use: Never used  Substance and Sexual Activity   Alcohol use: Never   Drug use: Never   Sexual activity: Yes    Birth control/protection: None  Other Topics Concern   Not on file  Social History Narrative   Lives at college   1 can of [place] per day   Social Determinants of Health   Financial Resource Strain: Not on file  Food Insecurity: No Food Insecurity (01/29/2022)   Hunger Vital Sign    Worried About Running Out of Food in the Last Year: Never true    Ran Out of Food in the Last Year: Never true  Transportation Needs: No Transportation Needs (01/29/2022)   PRAPARE - Hydrologist (Medical): No    Lack of Transportation (Non-Medical): No  Physical Activity: Not on file  Stress: Not on file  Social Connections: Not on file  Intimate Partner Violence: Not At Risk (02/01/2022)   Humiliation, Afraid, Rape, and Kick questionnaire    Fear of Current or Ex-Partner: No    Emotionally Abused: No    Physically Abused: No    Sexually Abused: No      Marcial Pacas, M.D. Ph.D.  [facility] [address] [place], [place] [address] Ph: [phone] Fax: [phone]  CC:  Jeannene Patella, [address],  [place] [address]  Jeannene Patella, PA

## 2022-02-06 ENCOUNTER — Emergency Department (HOSPITAL_COMMUNITY): Payer: Medicaid Other

## 2022-02-06 ENCOUNTER — Emergency Department (HOSPITAL_COMMUNITY)
Admission: EM | Admit: 2022-02-06 | Discharge: 2022-02-07 | Discharge status: Against Medical Advice | Payer: Medicaid Other | Attending: Emergency Medicine | Admitting: Emergency Medicine

## 2022-02-06 ENCOUNTER — Other Ambulatory Visit: Payer: Self-pay

## 2022-02-06 DIAGNOSIS — R0602 Shortness of breath: Secondary | ICD-10-CM | POA: Insufficient documentation

## 2022-02-06 DIAGNOSIS — R0781 Pleurodynia: Secondary | ICD-10-CM | POA: Insufficient documentation

## 2022-02-06 DIAGNOSIS — Z5321 Procedure and treatment not carried out due to patient leaving prior to being seen by health care provider: Secondary | ICD-10-CM | POA: Diagnosis not present

## 2022-02-06 LAB — CBC WITH DIFFERENTIAL/PLATELET

## 2022-02-06 LAB — I-STAT BETA HCG BLOOD, ED (MC, WL, AP ONLY): I-stat hCG, quantitative: <5 m[IU]/mL (ref <5)

## 2022-02-06 NOTE — ED Triage Notes (Signed)
Patient reports intermittent mid chest pain for 2 months with mild SOB , no emesis or diaphoresis . No cough or fever .

## 2022-02-06 NOTE — ED Provider Triage Note (Signed)
Emergency Medicine Provider Triage Evaluation Note  Thomasa Heidler , a 21 y.o. female  was evaluated in triage.  Pt complains of CP.  Some SOB. No fevers. No hemoptysis. Pain is pleuritic.   Review of Systems  Positive: SOB, CP Negative: Fever   Physical Exam  BP 114/69 (BP Location: Right Arm)   Pulse 89   Temp 98 F (36.7 C)   Resp 19   SpO2 98%  Gen:   Awake, no distress   Resp:  Normal effort  MSK:   Moves extremities without difficulty  Other:  TTP of chest wall  Medical Decision Making  Medically screening exam initiated at 11:03 PM.  Appropriate orders placed.  Larhonda Dettloff was informed that the remainder of the evaluation will be completed by another provider, this initial triage assessment does not replace that evaluation, and the importance of remaining in the ED until their evaluation is complete.  Labs, dimer   Solon Augusta Marueno, [place] 02/06/22 2311

## 2022-02-07 ENCOUNTER — Emergency Department (HOSPITAL_COMMUNITY): Payer: Medicaid Other

## 2022-02-07 LAB — BASIC METABOLIC PANEL
Anion gap: 10 (ref 5–15)
BUN: 9 mg/dL (ref 6–20)
CO2: 23 mmol/L (ref 22–32)
Calcium: 9.1 mg/dL (ref 8.9–10.3)
Chloride: 106 mmol/L (ref 98–111)
Creatinine, Ser: 0.56 mg/dL (ref 0.44–1.00)
GFR, Estimated: >60 mL/min (ref >60)
Glucose, Bld: 89 mg/dL (ref 70–99)
Potassium: 3.7 mmol/L (ref 3.5–5.1)
Sodium: 139 mmol/L (ref 135–145)

## 2022-02-07 LAB — CBC
HCT: 31.7 % — ABNORMAL LOW (ref 36.0–46.0)
Hemoglobin: 10 g/dL — ABNORMAL LOW (ref 12.0–15.0)
MCH: 25.9 pg — ABNORMAL LOW (ref 26.0–34.0)
MCHC: 31.5 g/dL (ref 30.0–36.0)
MCV: 82.1 fL (ref 80.0–100.0)
Platelets: 360 10*3/uL (ref 150–400)
RBC: 3.86 MIL/uL — ABNORMAL LOW (ref 3.87–5.11)
RDW: 14.5 % (ref 11.5–15.5)
WBC: 3.6 10*3/uL — ABNORMAL LOW (ref 4.0–10.5)
nRBC: 0 % (ref 0.0–0.2)

## 2022-02-07 LAB — TROPONIN I (HIGH SENSITIVITY)
Troponin I (High Sensitivity): <2 ng/L (ref <18)
Troponin I (High Sensitivity): <2 ng/L (ref <18)

## 2022-02-07 LAB — D-DIMER, QUANTITATIVE: D-Dimer, Quant: 1.65 ug/mL-FEU — ABNORMAL HIGH (ref 0.00–0.50)

## 2022-02-07 MED ORDER — IOHEXOL 350 MG/ML SOLN
50.0000 mL | Freq: Once | INTRAVENOUS | Status: AC | PRN
  Administered 2022-02-07: 50 mL via INTRAVENOUS

## 2022-02-07 NOTE — ED Notes (Signed)
Pt states she is leaving due to wait 

## 2022-02-09 NOTE — Telephone Encounter (Signed)
I have completed the order for IVIG and will have Dr. Terrace Arabia review/sign.

## 2022-02-10 LAB — COMPREHENSIVE METABOLIC PANEL
ALT: 16 IU/L (ref 0–32)
AST: 25 IU/L (ref 0–40)
Albumin/Globulin Ratio: 0.8 — ABNORMAL LOW (ref 1.2–2.2)
Albumin: 4.1 g/dL (ref 4.0–5.0)
Alkaline Phosphatase: 64 IU/L (ref 42–106)
BUN/Creatinine Ratio: 21 (ref 9–23)
BUN: 14 mg/dL (ref 6–20)
Bilirubin Total: 0.3 mg/dL (ref 0.0–1.2)
CO2: 23 mmol/L (ref 20–29)
Calcium: 10.1 mg/dL (ref 8.7–10.2)
Chloride: 104 mmol/L (ref 96–106)
Creatinine, Ser: 0.66 mg/dL (ref 0.57–1.00)
Globulin, Total: 4.9 g/dL — ABNORMAL HIGH (ref 1.5–4.5)
Glucose: 77 mg/dL (ref 70–99)
Potassium: 4.1 mmol/L (ref 3.5–5.2)
Sodium: 140 mmol/L (ref 134–144)
Total Protein: 9 g/dL — ABNORMAL HIGH (ref 6.0–8.5)
eGFR: 130 mL/min/{1.73_m2} (ref >59)

## 2022-02-10 LAB — CBC WITH DIFFERENTIAL/PLATELET
Basophils Absolute: 0 10*3/uL (ref 0.0–0.2)
Basos: 0 %
EOS (ABSOLUTE): 0.1 10*3/uL (ref 0.0–0.4)
Eos: 2 %
Hematocrit: 32.1 % — ABNORMAL LOW (ref 34.0–46.6)
Hemoglobin: 10.2 g/dL — ABNORMAL LOW (ref 11.1–15.9)
Immature Grans (Abs): 0 10*3/uL (ref 0.0–0.1)
Immature Granulocytes: 0 %
Lymphocytes Absolute: 2.3 10*3/uL (ref 0.7–3.1)
Lymphs: 73 %
MCH: 25.5 pg — ABNORMAL LOW (ref 26.6–33.0)
MCHC: 31.8 g/dL (ref 31.5–35.7)
MCV: 80 fL (ref 79–97)
Monocytes Absolute: 0.4 10*3/uL (ref 0.1–0.9)
Monocytes: 12 %
Neutrophils Absolute: 0.4 10*3/uL — CL (ref 1.4–7.0)
Neutrophils: 13 %
Platelets: 351 10*3/uL (ref 150–450)
RBC: 4 x10E6/uL (ref 3.77–5.28)
RDW: 14.6 % (ref 11.7–15.4)
WBC: 3.1 10*3/uL — ABNORMAL LOW (ref 3.4–10.8)

## 2022-02-10 LAB — IRON,TIBC AND FERRITIN PANEL
Ferritin: 65 ng/mL (ref 15–77)
Iron Saturation: 11 % — ABNORMAL LOW (ref 15–55)
Iron: 42 ug/dL (ref 27–159)
Total Iron Binding Capacity: 373 ug/dL (ref 250–450)
UIBC: 331 ug/dL (ref 131–425)

## 2022-02-11 NOTE — Telephone Encounter (Signed)
Dr. Terrace Arabia reviewed order and signed. Fax sent to [facility] infusion on 02/09/22 with confirmation of receipt.   Will continue to follow this case.

## 2022-02-16 ENCOUNTER — Emergency Department (HOSPITAL_COMMUNITY)
Admission: EM | Admit: 2022-02-16 | Discharge: 2022-02-17 | Discharge status: Against Medical Advice | Payer: Medicaid Other | Attending: Emergency Medicine | Admitting: Emergency Medicine

## 2022-02-16 ENCOUNTER — Emergency Department (HOSPITAL_COMMUNITY): Payer: Medicaid Other

## 2022-02-16 ENCOUNTER — Other Ambulatory Visit: Payer: Self-pay

## 2022-02-16 DIAGNOSIS — R079 Chest pain, unspecified: Secondary | ICD-10-CM | POA: Insufficient documentation

## 2022-02-16 DIAGNOSIS — R200 Anesthesia of skin: Secondary | ICD-10-CM | POA: Diagnosis not present

## 2022-02-16 DIAGNOSIS — Z5321 Procedure and treatment not carried out due to patient leaving prior to being seen by health care provider: Secondary | ICD-10-CM | POA: Diagnosis not present

## 2022-02-16 LAB — CBC WITH DIFFERENTIAL/PLATELET
Abs Immature Granulocytes: 0 10*3/uL (ref 0.00–0.07)
Basophils Absolute: 0 10*3/uL (ref 0.0–0.1)
Basophils Relative: 0 %
Eosinophils Absolute: 0.1 10*3/uL (ref 0.0–0.5)
Eosinophils Relative: 2 %
HCT: 34.5 % — ABNORMAL LOW (ref 36.0–46.0)
Hemoglobin: 10.5 g/dL — ABNORMAL LOW (ref 12.0–15.0)
Immature Granulocytes: 0 %
Lymphocytes Relative: 57 %
Lymphs Abs: 2.6 10*3/uL (ref 0.7–4.0)
MCH: 25.2 pg — ABNORMAL LOW (ref 26.0–34.0)
MCHC: 30.4 g/dL (ref 30.0–36.0)
MCV: 82.7 fL (ref 80.0–100.0)
Monocytes Absolute: 0.4 10*3/uL (ref 0.1–1.0)
Monocytes Relative: 8 %
Neutro Abs: 1.5 10*3/uL — ABNORMAL LOW (ref 1.7–7.7)
Neutrophils Relative %: 33 %
Platelets: 410 10*3/uL — ABNORMAL HIGH (ref 150–400)
RBC: 4.17 MIL/uL (ref 3.87–5.11)
RDW: 14.9 % (ref 11.5–15.5)
WBC: 4.6 10*3/uL (ref 4.0–10.5)
nRBC: 0 % (ref 0.0–0.2)

## 2022-02-16 LAB — BASIC METABOLIC PANEL
Anion gap: 9 (ref 5–15)
BUN: 10 mg/dL (ref 6–20)
CO2: 24 mmol/L (ref 22–32)
Calcium: 9.7 mg/dL (ref 8.9–10.3)
Chloride: 104 mmol/L (ref 98–111)
Creatinine, Ser: 0.49 mg/dL (ref 0.44–1.00)
GFR, Estimated: >60 mL/min (ref >60)
Glucose, Bld: 87 mg/dL (ref 70–99)
Potassium: 3.7 mmol/L (ref 3.5–5.1)
Sodium: 137 mmol/L (ref 135–145)

## 2022-02-16 LAB — I-STAT BETA HCG BLOOD, ED (MC, WL, AP ONLY): I-stat hCG, quantitative: <5 m[IU]/mL (ref <5)

## 2022-02-16 LAB — TROPONIN I (HIGH SENSITIVITY): Troponin I (High Sensitivity): <2 ng/L (ref <18)

## 2022-02-16 NOTE — ED Provider Triage Note (Signed)
Emergency Medicine Provider Triage Evaluation Note  Savannah Alvarez , a 21 y.o. female  was evaluated in triage.  Pt complains of chest pain.  Patient was hospitalized recently and diagnosed with Guillain-Barr syndrome" and treated with IVIG infusions.  She says she has been having central chest pain in a bandlike pattern across her chest for about 1 to 2 weeks.  She denies any new numbness or weakness in her feet, reports she has been having some chronic numbness in her hands and feet since her hospitalization.  Review of Systems  Positive: Chest pain Negative: Difficulty breathing  Physical Exam  BP 118/76 (BP Location: Left Arm)   Pulse 100   Temp 99.3 F (37.4 C)   Resp 17   Ht 5' 2.5" (1.588 m)   Wt 54.9 kg   LMP 01/30/2022   SpO2 99%   BMI 21.80 kg/m  Gen:   Awake, no distress   Resp:  Normal effort  MSK:   Moves extremities without difficulty; patient has thoracic midline tenderness with palpation over the T-spine which reproduces her bandlike chest pain   Medical Decision Making  Medically screening exam initiated at 10:00 PM.  Appropriate orders placed.  Savannah Alvarez was informed that the remainder of the evaluation will be completed by another provider, this initial triage assessment does not replace that evaluation, and the importance of remaining in the ED until their evaluation is complete.  Patient is here with chest pain.  I ordered chest pain evaluation labs.  Also consideration would be a thoracic myelopathy given her neurological history.  She does not have evidence of acute worsening GB Barre in triage.   Savannah Sleeper, MD 02/16/22 2201

## 2022-02-16 NOTE — ED Triage Notes (Signed)
Pt via POV c/o diffuse chest pain from side to side for several months, worse since last Wed. Pt had ortho surgery in October and later found that she has Guillain Barre syndrome and received her first IVIG treatment 2 weeks ago. Pt states she is having some difficulty breathing intermittently. Some numbness in fingertips and feet for a few days.

## 2022-02-18 ENCOUNTER — Telehealth: Payer: Self-pay | Admitting: Neurology

## 2022-02-18 NOTE — Telephone Encounter (Signed)
I called  pt's mom and update Dr. Zannie Cove response. She verbalized understanding and appreciation for the call.

## 2022-02-18 NOTE — Telephone Encounter (Signed)
Rx for Meloxicam given today, ok to take?

## 2022-02-18 NOTE — Telephone Encounter (Signed)
Pt's mother is asking for a call to discuss if Dr Terrace Arabia is ok with pt taking Meloxicam, please call.

## 2022-02-18 NOTE — Telephone Encounter (Signed)
I called the pt and spoke with her mom. She reports [facility] has been in touch with her about scheduling and will keep our office up to date.  I will check back next week.

## 2022-02-18 NOTE — Telephone Encounter (Signed)
Ok to take Meloxicam as needed.

## 2022-02-20 ENCOUNTER — Encounter: Payer: Self-pay | Admitting: Occupational Therapy

## 2022-02-20 ENCOUNTER — Ambulatory Visit: Payer: Medicaid Other | Admitting: Occupational Therapy

## 2022-02-20 ENCOUNTER — Ambulatory Visit: Payer: Medicaid Other | Attending: Physician Assistant | Admitting: Physical Therapy

## 2022-02-20 ENCOUNTER — Encounter: Payer: Self-pay | Admitting: Physical Therapy

## 2022-02-20 DIAGNOSIS — G610 Guillain-Barre syndrome: Secondary | ICD-10-CM | POA: Insufficient documentation

## 2022-02-20 DIAGNOSIS — M6281 Muscle weakness (generalized): Secondary | ICD-10-CM | POA: Insufficient documentation

## 2022-02-20 DIAGNOSIS — R2681 Unsteadiness on feet: Secondary | ICD-10-CM | POA: Diagnosis present

## 2022-02-20 DIAGNOSIS — R29818 Other symptoms and signs involving the nervous system: Secondary | ICD-10-CM | POA: Insufficient documentation

## 2022-02-20 DIAGNOSIS — R208 Other disturbances of skin sensation: Secondary | ICD-10-CM | POA: Insufficient documentation

## 2022-02-20 DIAGNOSIS — Z789 Other specified health status: Secondary | ICD-10-CM

## 2022-02-20 DIAGNOSIS — R2689 Other abnormalities of gait and mobility: Secondary | ICD-10-CM | POA: Insufficient documentation
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
## 2022-02-20 NOTE — Therapy (Signed)
OUTPATIENT PHYSICAL THERAPY NEURO EVALUATION   Patient Name: Savannah Alvarez MRN: [id number] DOB:04-03-2000, 21 y.o., female Today's Date: 02/20/2022   PCP: Savannah Lipps, PA  REFERRING PROVIDER:  [place], Eric J, DO  END OF SESSION:  PT End of Session - 02/20/22 1223     Visit Number 1    Number of Visits 9    Date for PT Re-Evaluation 04/21/22    Authorization Type [place] Medicaid [employer]    PT Start Time 1020    PT Stop Time 1100    PT Time Calculation (min) 40 min    Equipment Utilized During Treatment Gait belt    Activity Tolerance Patient tolerated treatment well    Behavior During Therapy WFL for tasks assessed/performed             Past Medical History:  Diagnosis Date   Eczema    Past Surgical History:  Procedure Laterality Date   HIP SURGERY Right    Patient Active Problem List   Diagnosis Date Noted   Protein-calorie malnutrition, severe 01/31/2022   Guillain Barr syndrome (HCC) 01/29/2022   Anemia 01/29/2022   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   Gait abnormality 01/28/2022   Angioedema 09/04/2013   Swelling of face 09/04/2013    ONSET DATE:  02/02/2022  REFERRING DIAG: G61.0 (ICD-10-CM) - 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 Reports numbness in hands and feet are starting to return. Just got away from her RW a few days ago. Feels like R leg will  cross over at times when she is walking and will bump into the wall at times. Saw the orthopedic doctor and everything is healing well in her hips and has no weight bearing precautions at this time. Reports has no hip precautions. Reports she lost a lot of muscle mass.   Pt accompanied by: family member Mom, Savannah Alvarez   PERTINENT HISTORY: Savannah Alvarez is a 21 y.o. 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   Patient reports on October 30, she woke up with a tingling sensation in bilateral hands and feet which progressively worsened to all extremities.  She reports 7 falls since mid November and difficulty holding things in her hands such as utensils.  She was seen by neurology outpatient, Savannah Alvarez and had EMG-NCS performed which confirmed acute demyelinating polyradiculoneuropathy, mild axonal component and she was directed to the ED for further evaluation and treatment with IVIG versus Plex and LP. Pt treated with IVIG infusions. Discharged 02/02/22.  PAIN:  Are you having pain?  Reports that sometimes pain in her R hip   PRECAUTIONS: Fall  FALLS: Has patient fallen in last 6 months? Yes. 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 Can just detect around knees. Proprioception: WFL for ankle PF/DF bilat   Can detect deeper pressure more proximally, unable to detect at ankles   COORDINATION: Heel to shin: slower and weaker to perform with RLE     POSTURE: rounded shoulders and forward head    LOWER EXTREMITY MMT:    MMT Right Eval Left Eval  Hip flexion 3- 5  Hip extension 3- 4  Hip abduction 3- 5  Hip adduction    Hip internal rotation    Hip external rotation    Knee flexion 5 5  Knee extension 5 5  Ankle dorsiflexion 5 5  Ankle plantarflexion    Ankle inversion    Ankle eversion    (Blank rows = not tested)   TRANSFERS: Assistive device utilized: None  Sit to stand: Complete Independence Stand to sit: Complete Independence   STAIRS: Level of Assistance: SBA Stair Negotiation Technique: Alternating Pattern  Forwards with no handrail  Number of Stairs: 6  Height of Stairs: 4    GAIT: Gait pattern: step through pattern, decreased step length- Left, decreased stance time- Right, and antalgic Distance walked: Clinic distances Assistive device utilized: None Level of assistance: Modified independence and SBA   FUNCTIONAL TESTS:  5 times sit to stand: 10.2 seconds with no UE support 10 meter walk test: 13.25 seconds = 2.47 ft/sec    [facility] - 02/20/22 1056       Functional Gait  Assessment   Gait assessed  Yes    Gait Level Surface Walks 20 ft, slow speed, abnormal gait pattern, evidence for imbalance or deviates 10-15 in outside of the 12 in walkway width. Requires more than 7 sec to ambulate 20 ft.   8.2 seconds   Change in Gait Speed Able to change speed, demonstrates mild gait deviations, deviates 6-10 in  outside of the 12 in walkway width, or no gait  deviations, unable to achieve a major change in velocity, or uses a change in velocity, or uses an assistive device.    Gait with Horizontal Head Turns Performs head turns smoothly with slight change in gait velocity (eg, minor disruption to smooth gait path), deviates 6-10 in outside 12 in walkway width, or uses an assistive device.    Gait with Vertical Head Turns Performs task with slight change in gait velocity (eg, minor disruption to smooth gait path), deviates 6 - 10 in outside 12 in walkway width or uses assistive device    Gait and Pivot Turn Pivot turns safely within 3 sec and stops quickly with no loss of balance.    Step Over Obstacle Is able to step over 2 stacked shoe boxes taped together (9 in total height) without changing gait speed. No evidence of imbalance.    Gait with Narrow Base of Support Ambulates 4-7 steps.    Gait with Eyes Closed Cannot walk 20 ft without assistance, severe gait deviations or imbalance, deviates greater than 15 in outside 12 in walkway width or will not attempt task.    Ambulating Backwards Walks 20 ft, uses assistive device, slower speed, mild gait deviations, deviates 6-10 in outside 12 in walkway width.    Steps Alternating feet, no rail.    Total Score 19    FGA comment: 19/30              TODAY'S TREATMENT:                                                                                                                              N/A during eval.   PATIENT EDUCATION: Education details: Clinical findings, POC, Medicaid visit limiting, receiving both OT and PT at this clinic.  Person educated: Patient and Parent Education method: Explanation Education comprehension: verbalized understanding  HOME EXERCISE PROGRAM: Will provide at next session.  Pt given exercises for hip strengthening from ortho PT the other day, educated to continue to work on those.   GOALS: Goals reviewed with patient?  Yes  SHORT TERM GOALS: Target date: 03/20/2022  Pt will be independent with initial HEP in order to build upon functional gains made in therapy. Baseline: Dependent Goal status: INITIAL  2.  Pt will undergo further testing of mCTSIB with LTG written.  Baseline: Not yet tested.  Goal status: INITIAL  3.  Pt will improve FGA to at least a 24/30 in order to demo decr fall risk. Baseline: 19/30 Goal status: INITIAL  4.  Pt will improve gait speed with no AD to at least 2.9 ft/sec in order to demo improved community mobility.  Baseline: 13.25 seconds = 2.47 ft/sec Goal status: INITIAL    LONG TERM GOALS: Target date: 04/17/2022  Pt will be independent with final HEP in order to build upon functional gains made in therapy. Baseline: dependent Goal status: INITIAL  2.  Pt will improve gait speed with no AD  to at least 3.5 ft/sec in order to demo improved community mobility.  Baseline: 13.25 seconds = 2.47 ft/sec Goal status: INITIAL  3.   Pt will improve FGA to at least a 29/30 in order to demo decr fall risk. Baseline: 19/30 Goal status: INITIAL  4.  mCTSIB goal to be written as appropriate.  Baseline: Not yet assessed  Goal status: INITIAL  5.  Pt will improve R hip ABD and hip extensor strength to at least a 4/5 in order to demo improved functional strength for daily activities.  Baseline: 3- for both  Goal status: INITIAL  6.  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 Pt recently saw an ortho PT (for eval only) after R hip surgery for exercises, but pt would like to receive care at this clinic and  she can receive PT/OT services. Pt has no weight bearing precautions. The following deficits were present during the exam: decr strength, impaired sensation, postural abnormalities, gait abnormalities, impaired balance, decr endurance. Based on FGA, pt is an incr risk for falls. Pt's gait speed with no AD indicates that pt is a limited community ambulator. Pt would benefit from skilled PT to address these impairments and functional limitations to maximize functional mobility independence and return to PLOF.    OBJECTIVE IMPAIRMENTS: Abnormal gait, decreased activity tolerance, decreased balance, decreased coordination, decreased knowledge of condition, decreased mobility, difficulty walking, decreased ROM, decreased strength, impaired sensation, and postural dysfunction.   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   REHAB POTENTIAL: Good  CLINICAL DECISION MAKING: Stable/uncomplicated  EVALUATION COMPLEXITY: Low  PLAN:  PT FREQUENCY: 1x/week  PT DURATION: 8 weeks  PLANNED INTERVENTIONS: Therapeutic exercises, Therapeutic activity, Neuromuscular re-education, Balance training, Gait training, Patient/Family education, Self Care, Vestibular training, Manual therapy, and Re-evaluation  PLAN FOR NEXT SESSION: assess mCTSIB and write goal as needed. Initial HEP for RLE hip strengthening, standing balance with EC, head motions, SLS tasks. Pt wants to return to weight lifting one day    Drake Leach, PT, DPT 02/20/2022, 1:45 PM   Check all possible CPT codes: 16010 - PT Re-evaluation, 97110- Therapeutic Exercise, [phone]  Neuro Re-education, [phone] - Gait Training, [phone] - Manual Therapy, [phone] - Therapeutic Activities, and [phone] - Self Care    Check all conditions that are expected to impact treatment: Neurological condition   If treatment provided at initial evaluation, no treatment charged due to lack of authorization.

## 2022-02-25 ENCOUNTER — Ambulatory Visit: Payer: Medicaid Other | Attending: Physician Assistant | Admitting: Physical Therapy

## 2022-02-25 DIAGNOSIS — M6281 Muscle weakness (generalized): Secondary | ICD-10-CM | POA: Insufficient documentation

## 2022-02-25 DIAGNOSIS — R29818 Other symptoms and signs involving the nervous system: Secondary | ICD-10-CM | POA: Diagnosis present

## 2022-02-25 DIAGNOSIS — R208 Other disturbances of skin sensation: Secondary | ICD-10-CM | POA: Diagnosis present

## 2022-02-25 DIAGNOSIS — R2681 Unsteadiness on feet: Secondary | ICD-10-CM | POA: Diagnosis present

## 2022-02-25 DIAGNOSIS — R2689 Other abnormalities of gait and mobility: Secondary | ICD-10-CM | POA: Insufficient documentation

## 2022-02-25 NOTE — Therapy (Signed)
OUTPATIENT PHYSICAL THERAPY NEURO TREATMENT   Patient Name: Savannah Alvarez MRN: [id number] DOB:2001-01-03, 21 y.o., female Today's Date: 02/25/2022   PCP: Jeannene Patella, PA  REFERRING PROVIDER:  [place] Eric J, DO  END OF SESSION:  PT End of Session - 02/25/22 0816     Visit Number 2    Number of Visits 9    Date for PT Re-Evaluation 04/21/22    Authorization Type [place] Medicaid [employer]    PT Start Time 0815   Pt arrived late   PT Stop Time 0843    PT Time Calculation (min) 28 min    Equipment Utilized During Treatment Gait belt    Activity Tolerance Patient tolerated treatment well    Behavior During Therapy [facility] for tasks assessed/performed              Past Medical History:  Diagnosis Date   Eczema    Past Surgical History:  Procedure Laterality Date   HIP SURGERY Right    Patient Active Problem List   Diagnosis Date Noted   Protein-calorie malnutrition, severe 01/31/2022   Guillain Barr syndrome ([place]) 01/29/2022   Anemia 01/29/2022   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[place]) 01/28/2022   Gait abnormality 01/28/2022   Angioedema 09/04/2013   Swelling of face 09/04/2013    ONSET DATE: 02/02/2022  REFERRING DIAG: G61.0 (ICD-10-CM) - 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 Needs to call the IVIG center to schedule her infusions. No falls.   Pt accompanied by: family member Savannah Alvarez, Savannah Alvarez   PERTINENT HISTORY: Irys Nigh is a 22 y.o. 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   Patient reports on October 30, she woke up with a tingling sensation in bilateral hands and feet which progressively worsened to all extremities.  She reports 7 falls since mid November and difficulty holding things in her hands such as utensils.  She was seen by neurology outpatient, Dr. Krista Blue and had EMG-NCS performed which confirmed acute demyelinating polyradiculoneuropathy, mild axonal  component and she was directed to the ED for further evaluation and treatment with IVIG versus Plex and LP. Pt treated with IVIG infusions. Discharged 02/02/22.  PAIN:  Are you having pain?  Reports that sometimes pain in her R hip   PRECAUTIONS: Fall  FALLS: Has patient fallen in last 6 months? Yes. 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 Can just detect around knees. Proprioception: WFL for ankle PF/DF bilat   Can detect deeper pressure more proximally, unable to  detect at ankles   COORDINATION: Heel to shin: slower and weaker to perform with RLE     POSTURE: rounded shoulders and forward head    LOWER EXTREMITY MMT:    MMT Right Eval Left Eval  Hip flexion 3- 5  Hip extension 3- 4  Hip abduction 3- 5  Hip adduction    Hip internal rotation    Hip external rotation    Knee flexion 5 5  Knee extension 5 5  Ankle dorsiflexion 5 5  Ankle plantarflexion    Ankle inversion    Ankle eversion    (Blank rows = not tested)    TODAY'S TREATMENT:                                                                                                                              Ther Act  MCTSIB:  Condition 1: Avg of 3 trials: 30 sec  Condition 2: Avg of 3 trials: 30 sec (noted minor A/P sway)  Condition 3: Avg of 3 trials: 30 sec  Condition 4: Avg of 3 trials: 30 sec (noted moderate A/P sway) Total Score: 120/120   Ther Ex  Established initial HEP (see bolded below) for improved vestibular input, single leg stability, and BLE strength.   GAIT: Gait pattern: step through pattern, decreased step length- Left, decreased stance time- Right, and antalgic Distance walked: Clinic distances Assistive device utilized: None Level of assistance: Modified independence and SBA  PATIENT EDUCATION: Education details: Initial HEP, 3 systems used for balance  Person educated: Patient and Parent Education method: Explanation Education comprehension: verbalized understanding  HOME EXERCISE PROGRAM: Access Code: [email or web address] URL: [email or web address] Date: 02/25/2022 Prepared  by: Mickie Bail   Exercises - Feet Together Balance Eyes Closed on Unstable Surface  - 1 x daily - 7 x weekly - 3-4 reps - 15-30 second hold - Narrow Stance with Eyes Closed and Head Rotation  - 1 x daily - 7 x weekly - 3 sets - 10 reps - Side Stepping with Resistance at Thighs  - 1 x daily - 7 x weekly - 3-4 reps - Forward Backward Monster Walk with Band at  [employer] and Counter Support  - 1 x daily - 7 x weekly - 3-4 reps - Modified Single-Leg Deadlift   - 1 x daily - 7 x weekly - 3 sets - 10 reps  Pt given exercises for hip strengthening from ortho PT the other day, educated to continue to work on those.   GOALS: Goals reviewed with patient? Yes  SHORT TERM GOALS: Target date: 03/20/2022  Pt will be independent with initial HEP in order to build upon functional gains made in therapy. Baseline: Dependent Goal status: INITIAL  2.  Pt will undergo further testing of mCTSIB with LTG written.  Baseline: Not yet tested.  Goal status: INITIAL  3.  Pt will improve FGA to at least a 24/30 in order to demo decr fall risk. Baseline: 19/30 Goal status: INITIAL  4.  Pt will improve gait speed with no AD to at least 2.9 ft/sec in order to demo improved community mobility.  Baseline: 13.25 seconds = 2.47 ft/sec Goal status: INITIAL    LONG TERM GOALS: Target date: 04/17/2022  Pt will be independent with final HEP in order to build upon functional gains made in therapy. Baseline: dependent Goal status: INITIAL  2.  Pt will improve gait speed with no AD to at least 3.5 ft/sec in order to demo improved community mobility.  Baseline: 13.25 seconds = 2.47 ft/sec Goal status: INITIAL  3.   Pt will improve FGA to at least a 29/30 in order to demo decr fall risk. Baseline: 19/30 Goal status: INITIAL  4.  mCTSIB goal to be written as appropriate.  Baseline: Dc'd as pt scored 120/120 on 02/25/22  Goal status: MET  5.  Pt will improve R hip ABD and hip extensor strength to at least a 4/5 in order to demo improved functional strength for daily activities.  Baseline: 3- for both  Goal status: INITIAL  6.  Pt will verbalize return to the gym and weight lifting (as appropriate) Baseline: Not going to the gym  Goal status: INITIAL  ASSESSMENT:  CLINICAL IMPRESSION: Session limited due to pt's late arrival. Emphasis of skilled PT session on  assessing vestibular input for balance and establishing initial HEP. Pt scored a 120/120 on MCTSIB, so goal discontinued. However did note moderate A/P sway on unlevel surface w/EC. Of note, pt wearing high-top shoes today, so unsure if balance assessment is accurate. Pt demonstrated initial HEP well, most challenged by staggered stance deadlifts. Continue POC.    OBJECTIVE IMPAIRMENTS: Abnormal gait, decreased activity tolerance, decreased balance, decreased coordination, decreased knowledge of condition, decreased mobility, difficulty walking, decreased ROM, decreased strength, impaired sensation, and postural dysfunction.   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   REHAB POTENTIAL: Good  CLINICAL DECISION MAKING: Stable/uncomplicated  EVALUATION COMPLEXITY: Low  PLAN:  PT FREQUENCY: 1x/week  PT DURATION: 8 weeks  PLANNED INTERVENTIONS: Therapeutic exercises, Therapeutic activity, Neuromuscular re-education, Balance training, Gait training, Patient/Family education, Self Care, Vestibular training, Manual therapy, and Re-evaluation  PLAN FOR NEXT SESSION: Add to HEP for RLE hip strengthening, standing balance with EC, head motions, SLS tasks. Unlevel surfaces, surge walking/DL    Cruzita Lederer , PT, DPT 02/25/2022, 8:43 AM   Check all possible CPT codes: 11572 - PT Re-evaluation, 97110- Therapeutic Exercise, [phone]- Neuro Re-education, [phone] - Gait Training, [phone] - Manual Therapy, 97530 - Therapeutic Activities, and [phone] - Self Care    Check all conditions that are expected to impact treatment: Neurological  condition   If treatment provided at initial evaluation, no treatment charged due to lack of authorization.

## 2022-02-27 ENCOUNTER — Telehealth: Payer: Self-pay | Admitting: Neurology

## 2022-02-27 NOTE — Telephone Encounter (Signed)
[employer] medicare Savannah Alvarez: [id number] exp.02/09/2022-04/10/2022 sent to GI [phone]

## 2022-03-04 ENCOUNTER — Ambulatory Visit: Payer: Medicaid Other | Admitting: Physical Therapy

## 2022-03-04 DIAGNOSIS — M6281 Muscle weakness (generalized): Secondary | ICD-10-CM

## 2022-03-04 DIAGNOSIS — R2689 Other abnormalities of gait and mobility: Secondary | ICD-10-CM

## 2022-03-04 NOTE — Therapy (Addendum)
OUTPATIENT PHYSICAL THERAPY NEURO TREATMENT   Patient Name: Savannah Alvarez MRN: [id number] DOB:22-Mar-2000, 22 y.o., female Today's Date: 03/04/2022   PCP: Jeannene Patella, PA  REFERRING PROVIDER:  [place] Eric J, DO  END OF SESSION:  PT End of Session - 03/04/22 0812     Visit Number 3    Number of Visits 9    Date for PT Re-Evaluation 04/21/22    Authorization Type [place] Medicaid [employer]    PT Start Time 0810   Pt arrived late   PT Stop Time 0843    PT Time Calculation (min) 33 min    Equipment Utilized During Treatment --    Activity Tolerance Patient tolerated treatment well    Behavior During Therapy [facility] for tasks assessed/performed               Past Medical History:  Diagnosis Date   Eczema    Past Surgical History:  Procedure Laterality Date   HIP SURGERY Right    Patient Active Problem List   Diagnosis Date Noted   Protein-calorie malnutrition, severe 01/31/2022   Guillain Barr syndrome ([place]) 01/29/2022   Anemia 01/29/2022   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   Gait abnormality 01/28/2022   Angioedema 09/04/2013   Swelling of face 09/04/2013    ONSET DATE: 02/02/2022  REFERRING DIAG: G61.0 (ICD-10-CM) - 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 Has been doing her HEP regularly, can do monster walks without UE support now. Has been having a lot of pain in her hip due to the rain. No falls.   Pt accompanied by: family member Mom, Shonda   PERTINENT HISTORY: Savannah Alvarez  is a 22 y.o. 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   Patient reports on October 30, she woke up with a tingling sensation in bilateral hands and feet which progressively worsened to all extremities.  She reports 7 falls since mid November and difficulty holding things in her hands such as utensils.  She was seen by neurology  outpatient, Dr. Krista Blue and had EMG-NCS performed which confirmed acute demyelinating polyradiculoneuropathy, mild axonal component and she was directed to the ED for further evaluation and treatment with IVIG versus Plex and LP. Pt treated with IVIG infusions. Discharged 02/02/22.  PAIN:  Are you having pain? Yes: NPRS scale: 4/10 Pain location: R hip Pain description: Achy/throbbing/sharp  PRECAUTIONS: Fall  FALLS: Has patient fallen in last 6 months? Yes. 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 Can just detect around knees. Proprioception:  WFL for ankle PF/DF bilat   Can detect deeper pressure more proximally, unable to detect at ankles   COORDINATION: Heel to shin: slower and weaker to perform with RLE     POSTURE: rounded shoulders and forward head    LOWER EXTREMITY MMT:    MMT Right Eval Left Eval  Hip flexion 3- 5  Hip extension 3- 4  Hip abduction 3- 5  Hip adduction    Hip internal rotation    Hip external rotation    Knee flexion 5 5  Knee extension 5 5  Ankle dorsiflexion 5 5  Ankle plantarflexion    Ankle inversion    Ankle eversion    (Blank rows = not tested)    TODAY'S TREATMENT:                                                                                                                          Ther Ex  Seated hamstring stretch, 2x30s per side. Added to HEP (see bolded below) as pt reports feeling a lot of tightness in this area.  Thomas stretch on R side, 2x60s, for improved R hip flexor ROM. Pt reported feeling a good stretch and relief with this.  Elliptical level 1 for 4 minutes in fwd direction for improved BLE strength, endurance and cardiovascular warmup. RPE of 7-8/10 following activity.  On rockerboard in A/P direction w/intermittent UE support for balance, A/P weightshifts for improved ankle strategy and midline orientation.  Progressed to mini squats on board without UE support, x10 reps. No instability noted throughout and pt able to control eccentric well   Double leg presses, x12 reps at 70#, for improved BLE strength. Progressed to 10  reps of single leg presses at 50# to target single leg stability and hip position.     GAIT: Gait pattern: step through pattern, decreased step length- Left, decreased stance time- Right, and antalgic Distance walked: Clinic distances Assistive device utilized: None Level of assistance: Modified independence   PATIENT EDUCATION: Education details: Additions to HEP  Person educated: Patient and Parent Education method:  Explanation Education comprehension: verbalized understanding  HOME EXERCISE PROGRAM: Access Code: [email or web address] URL: [email or web address] Date: 02/25/2022 Prepared by: Mickie Bail   Exercises - Feet Together Balance Eyes Closed on Unstable Surface  - 1 x daily - 7 x weekly - 3-4 reps - 15-30 second hold - Narrow Stance with Eyes Closed and Head Rotation  - 1 x daily - 7 x weekly - 3 sets - 10 reps - Side Stepping with Resistance at Thighs  - 1 x daily - 7 x weekly - 3-4 reps - Forward Backward Monster Walk with Band at [employer] and Counter Support  - 1 x daily - 7 x weekly - 3-4 reps - Modified Single-Leg Deadlift   - 1 x daily - 7 x weekly - 3 sets - 10 reps - Seated Hamstring Stretch  - 1 x daily - 7 x weekly - 3 sets - 10 reps - Hip Flexor Stretch at Edge of Bed  - 1 x daily - 7 x weekly - 3-4 reps - 30-60 second hold  Pt given exercises for hip strengthening from ortho PT the other day, educated to continue to work on those.   GOALS: Goals reviewed with patient? Yes  SHORT TERM GOALS: Target date: 03/20/2022  Pt will be independent with initial HEP in order to build upon functional gains made in therapy. Baseline: Dependent Goal status: INITIAL  2.  Pt will undergo further testing of mCTSIB with LTG written.  Baseline: Not yet tested.  Goal status: INITIAL  3.  Pt will improve FGA to at least a 24/30 in order to demo decr fall risk. Baseline: 19/30 Goal status: INITIAL  4.  Pt will improve gait speed with no AD to at least 2.9 ft/sec in order to demo improved community mobility.  Baseline: 13.25 seconds = 2.47 ft/sec Goal status: INITIAL    LONG TERM GOALS: Target date: 04/17/2022  Pt will be independent with final HEP in order to build upon functional gains made in therapy. Baseline: dependent Goal status: INITIAL  2.  Pt will improve gait speed with no AD to at least 3.5 ft/sec in order to demo improved community mobility.  Baseline: 13.25 seconds =  2.47 ft/sec Goal status: INITIAL  3.   Pt will improve FGA to at least a 29/30 in order to demo decr fall risk. Baseline: 19/30 Goal status: INITIAL  4.  mCTSIB goal to be written as appropriate.  Baseline: Dc'd as pt scored 120/120 on 02/25/22  Goal status: MET  5.  Pt will improve R hip ABD and hip extensor strength to at least a 4/5 in order to demo improved functional strength for daily activities.  Baseline: 3- for both  Goal status: INITIAL  6.  Pt will verbalize return to the gym and weight lifting (as appropriate) Baseline: Not going to the gym  Goal status: INITIAL  ASSESSMENT:  CLINICAL IMPRESSION: Session limited due to pt's late arrival. Emphasis of skilled PT session on BLE strength, stretching and endurance. Pt reported feeling pain relief w/Thomas Stretch, so added to HEP. Pt tolerated session well but became very fatigued on elliptical,  requiring seated rest break. Pt demonstrates improved ankle strategy and balance this date, pt reports her HEP is helpful. Continue POC.    OBJECTIVE IMPAIRMENTS: Abnormal gait, decreased activity tolerance, decreased balance, decreased coordination, decreased knowledge of condition, decreased mobility, difficulty walking, decreased ROM, decreased strength, impaired sensation, and postural dysfunction.   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   REHAB POTENTIAL: Good  CLINICAL DECISION MAKING: Stable/uncomplicated  EVALUATION COMPLEXITY: Low  PLAN:  PT FREQUENCY: 1x/week  PT DURATION: 8 weeks  PLANNED INTERVENTIONS: Therapeutic exercises, Therapeutic  activity, Neuromuscular re-education, Balance training, Gait training, Patient/Family education, Self Care, Vestibular training, Manual therapy, and Re-evaluation  PLAN FOR NEXT SESSION: Add to HEP for RLE hip strengthening, standing balance with EC, head motions, SLS tasks. Unlevel surfaces, surge walking/DL, weightlifting, leg presses, elliptical    E , PT, DPT 03/04/2022, 8:43 AM   Check all possible CPT codes: 99371 - PT Re-evaluation, 97110- Therapeutic Exercise, [phone]- Neuro Re-education, [phone] - Gait Training, [phone] - Manual Therapy, [phone] - Therapeutic Activities, and [phone] - Self Care    Check all conditions that are expected to impact treatment: Neurological condition   If treatment provided at initial evaluation, no treatment charged due to lack of authorization.

## 2022-03-09 ENCOUNTER — Telehealth: Payer: Self-pay

## 2022-03-09 ENCOUNTER — Encounter: Payer: Self-pay | Admitting: Physical Therapy

## 2022-03-09 NOTE — Therapy (Signed)
[facility] [address] [place], [place], [address] Phone: [phone]   Fax:  [phone]  Patient Details  Name: Savannah Alvarez MRN: [id number] Date of Birth: 08/11/2000 Referring Provider:  No ref. provider found  Encounter Date: 03/09/2022  Called pt regarding insurance authorization. Pt's voicemail not set up, so spoke to pt's mother who is to speak to pt. Also cancelled pt's OT appointment this week as pt has IVIG treatment at same time.    Cruzita Lederer , PT, DPT 03/09/2022, 3:56 PM  [facility] [address] [place], [place], [address] Phone: [phone]   Fax:  [phone]

## 2022-03-09 NOTE — Telephone Encounter (Signed)
IVIG scheduled for From:Janeese Beck      Sent:03/06/2022  6:40 PM EST        HG:DJ[facility] Message Message List   Subject:Infusion Follow up  Frazier Butt we got it scheduled for the 17th at [place] and 19th at 10:30am

## 2022-03-11 ENCOUNTER — Ambulatory Visit: Payer: Medicaid Other | Admitting: Occupational Therapy

## 2022-03-11 ENCOUNTER — Encounter: Payer: Self-pay | Admitting: Occupational Therapy

## 2022-03-11 ENCOUNTER — Ambulatory Visit: Payer: Medicaid Other | Admitting: Physical Therapy

## 2022-03-11 ENCOUNTER — Encounter: Payer: Self-pay | Admitting: Physical Therapy

## 2022-03-11 DIAGNOSIS — M6281 Muscle weakness (generalized): Secondary | ICD-10-CM | POA: Diagnosis not present

## 2022-03-11 DIAGNOSIS — R208 Other disturbances of skin sensation: Secondary | ICD-10-CM

## 2022-03-11 DIAGNOSIS — R2689 Other abnormalities of gait and mobility: Secondary | ICD-10-CM

## 2022-03-11 DIAGNOSIS — R2681 Unsteadiness on feet: Secondary | ICD-10-CM

## 2022-03-11 NOTE — Patient Instructions (Signed)
SENSORY PRECAUTIONS:   1) Look at hands when using them  2) Always check temperature of water with another body part that's intact or have someone else check it for you  3) Avoid cooking and use use of sharp objects (knives, scissors, etc) for now.  There are options in the future to use including long silicone oven mitts, cut resistant gloves, and oven rack pull.   4) Watch positioning while you sleep. Supports arms with pillows as needed.   EXERCISE:   Fill up shoebox sized Tupperware container with rice - place slightly larger and weighted objects in rice (large nuts/bolts, keys, sharpies, large dice). Close eyes and try to feel objects and pull them out. Have family member hold box from sliding and preventing rice from coming out.

## 2022-03-11 NOTE — Therapy (Signed)
OUTPATIENT PHYSICAL THERAPY NEURO TREATMENT   Patient Name: Savannah Alvarez MRN: [id number] DOB:Jul 12, 2000, 22 y.o., female Today's Date: 03/11/2022   PCP: Jeannene Patella, PA  REFERRING PROVIDER:  [place] Eric J, DO  END OF SESSION:  PT End of Session - 03/11/22 0805     Visit Number 4    Number of Visits 9    Date for PT Re-Evaluation 04/21/22    Authorization Type [place] Medicaid [employer] - 8 visits approved 02/25/22 - 05/27/22    Authorization - Visit Number 3    Authorization - Number of Visits 8    PT Start Time 0804    PT Stop Time 6789    PT Time Calculation (min) 40 min    Activity Tolerance Patient tolerated treatment well    Behavior During Therapy [facility] for tasks assessed/performed               Past Medical History:  Diagnosis Date   Eczema    Past Surgical History:  Procedure Laterality Date   HIP SURGERY Right    Patient Active Problem List   Diagnosis Date Noted   Protein-calorie malnutrition, severe 01/31/2022   Guillain Barr syndrome ([place]) 01/29/2022   Anemia 01/29/2022   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/28/2022   Gait abnormality 01/28/2022   Angioedema 09/04/2013   Swelling of face 09/04/2013    ONSET DATE: 02/02/2022  REFERRING DIAG: G61.0 (ICD-10-CM) - 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 Feels like her strength is getting better. Bending all  the way to the ground is getting difficult   Pt accompanied by: self   PERTINENT HISTORY: Savannah Alvarez is a 22 y.o. 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   Patient reports on October 30, she woke up with a tingling sensation in bilateral hands and feet which progressively worsened to all extremities.  She reports 7 falls since  mid November and difficulty holding things in her hands such as utensils.  She was seen by neurology outpatient, Dr. Krista Blue and had EMG-NCS performed which confirmed acute demyelinating polyradiculoneuropathy, mild axonal component and she was directed to the ED for further evaluation and treatment with IVIG versus Plex and LP. Pt treated with IVIG infusions. Discharged 02/02/22.  PAIN:  Are you having pain? Yes: NPRS scale: 4/10 Pain location: R hip Pain description: Achy/throbbing/sharp  PRECAUTIONS: Fall  FALLS: Has patient fallen in last 6 months? Yes. 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 Can just detect around knees. Proprioception: WFL for ankle PF/DF bilat   Can detect deeper pressure more proximally, unable to detect at ankles   COORDINATION: Heel to shin: slower and weaker to perform with RLE     POSTURE: rounded shoulders and forward head    LOWER EXTREMITY MMT:    MMT Right Eval Left Eval  Hip flexion 3- 5  Hip extension 3- 4  Hip abduction 3- 5  Hip adduction    Hip internal rotation    Hip external rotation    Knee flexion 5 5  Knee extension 5 5  Ankle dorsiflexion 5 5  Ankle plantarflexion    Ankle inversion    Ankle eversion    (Blank rows = not tested)    TODAY'S TREATMENT:           Ther Ex  Elliptical level 1 for 2 minutes in fwd direction for improved BLE strength, endurance and cardiovascular warmup. Pt needing a standing rest break for 1 minute and then level 1 for 2 minutes in the backwards direction. 4 minutes total on Elliptical  Bridge with alternating leg lift x10 reps each side, cues for full hip extension ROM  Single leg bridge x10 reps each side, pt more challenged with RLE as stance leg  In quadruped position: Alternating hip extensions x8 reps each side with cues to keep pelvis facing to the floor Bird dogs x10 reps each side, pt performing well, more fatigued afterwards with RLE Hip abduction fire hydrants, progressed to use of red tband (pt was given a band from previous PT, and upgraded to use of red t band for HEP), performed x10 reps each side  SLS marching forwards and backwards x4 reps at countertop with use of red tband around thighs, focus on holding each position for a few seconds   NMR On blue foam beam; tandem gait with head turns down and back x2 reps, pt with good balance  On blue side of BOSU Static stance with feet together for ankle strategy x30 seconds EO feet together head turns x10 reps, head nods x10 reps  On black side of BOSU:  5 reps  mini squats with 3-5 second hold at end range, an additional 5 reps of raising up onto toes   GAIT: Gait pattern: step through pattern, decreased step length- Left, decreased stance time- Right, and antalgic Distance walked: Clinic distances Assistive device utilized: None Level of assistance: Modified independence   PATIENT EDUCATION: Education details: Editor, commissioning with use of red tband for hip ABD strengthening  Person educated: Patient Education method: Medical illustrator Education comprehension: verbalized understanding and returned demonstration  HOME EXERCISE PROGRAM: Access Code: [email or web address] URL: [email or web address] Date: 02/25/2022 Prepared by: Alethia Berthold Plaster  Exercises - Feet Together Balance Eyes Closed on Unstable Surface  - 1 x daily - 7 x weekly - 3-4 reps - 15-30 second hold - Narrow Stance with Eyes Closed and Head Rotation  - 1 x daily - 7 x weekly - 3 sets - 10 reps - Side Stepping with Resistance at Thighs  - 1 x daily - 7 x weekly - 3-4 reps - Forward Backward Monster Walk with Band at [employer] and Counter Support  - 1 x daily - 7 x weekly - 3-4 reps - Modified Single-Leg Deadlift   - 1 x daily - 7 x weekly - 3 sets - 10 reps - Seated Hamstring Stretch  - 1 x daily - 7 x weekly - 3 sets - 10 reps - Hip Flexor Stretch at Edge of Bed  - 1 x daily - 7 x weekly - 3-4 reps - 30-60 second hold  Pt given exercises for hip strengthening from ortho PT the other day, educated to continue to work on those.   GOALS: Goals reviewed with patient? Yes  SHORT TERM GOALS: Target date: 03/20/2022  Pt will be independent with initial HEP in order to build upon functional gains made in therapy. Baseline: Dependent Goal status: INITIAL  2.  Pt will undergo further testing of mCTSIB with LTG written.  Baseline: Goal not needed.  Goal status: MET  3.  Pt will improve FGA to at least a 24/30 in order to demo decr fall risk. Baseline:  19/30 Goal status: INITIAL  4.  Pt will improve gait speed with no AD to at least 2.9 ft/sec in order to demo improved community mobility.  Baseline: 13.25 seconds = 2.47 ft/sec Goal status: INITIAL    LONG TERM GOALS: Target date: 04/17/2022  Pt will be independent with final HEP in order to build upon functional gains made in therapy. Baseline: dependent Goal status: INITIAL  2.  Pt will improve gait speed with no AD to at least 3.5 ft/sec in order to demo improved community mobility.  Baseline: 13.25 seconds = 2.47 ft/sec Goal status: INITIAL  3.   Pt will improve FGA to at least a 29/30 in order to demo decr fall risk. Baseline: 19/30 Goal status: INITIAL  4.  mCTSIB goal to be written as appropriate.  Baseline: Dc'd as pt scored 120/120 on 02/25/22  Goal status: MET  5.  Pt will improve R hip ABD and hip extensor strength to at least a 4/5 in order to demo improved functional strength for daily activities.  Baseline: 3- for both  Goal status: INITIAL  6.  Pt will verbalize return to the gym and weight lifting (as appropriate) Baseline: Not going to the gym  Goal status: INITIAL  ASSESSMENT:  CLINICAL IMPRESSION: Today's skilled session continued to focus on BLE strengthening, endurance, and standing balance on compliant surfaces. Pt's balance much better on compliant surfaces today  compared to eval and previous sessions. Progressed hip ABD strengthening exercises in quadruped with use of red t-band. Pt also fatigued on the elliptical today. Needed a standing rest break after 2 minutes, and then was able to continue for another 2 minutes. Will continue to progress towards LTGs.     OBJECTIVE IMPAIRMENTS: Abnormal gait, decreased activity tolerance, decreased balance, decreased coordination, decreased knowledge of condition, decreased mobility, difficulty walking, decreased ROM, decreased strength, impaired sensation, and postural dysfunction.   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   REHAB POTENTIAL: Good  CLINICAL DECISION MAKING: Stable/uncomplicated  EVALUATION COMPLEXITY: Low  PLAN:  PT FREQUENCY: 1x/week  PT DURATION: 8 weeks  PLANNED INTERVENTIONS: Therapeutic exercises, Therapeutic activity, Neuromuscular re-education, Balance training, Gait training, Patient/Family education, Self Care, Vestibular training, Manual therapy, and Re-evaluation  PLAN FOR NEXT SESSION: Check STGs. Add to HEP for RLE hip strengthening, standing balance with EC, head motions, SLS tasks. Unlevel surfaces, surge walking/DL, weightlifting, leg presses, elliptical   Drake Leach, PT, DPT 03/11/2022, 8:49 AM   Check all possible CPT codes: 22025 - PT Re-evaluation, 97110- Therapeutic Exercise, [phone]- Neuro Re-education, [phone] - Gait Training, [phone] - Manual Therapy, [phone] - Therapeutic Activities, and [phone] - Self Care    Check all conditions that are expected to impact treatment: Neurological condition   If treatment provided at initial evaluation, no treatment charged due to lack of authorization.
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
## 2022-03-14 ENCOUNTER — Ambulatory Visit
Admission: RE | Admit: 2022-03-14 | Discharge: 2022-03-14 | Disposition: A | Discharge status: Home / Self Care | Payer: Medicaid Other | Source: Ambulatory Visit | Attending: Neurology | Admitting: Neurology

## 2022-03-14 DIAGNOSIS — R29898 Other symptoms and signs involving the musculoskeletal system: Secondary | ICD-10-CM

## 2022-03-14 DIAGNOSIS — Z8669 Personal history of other diseases of the nervous system and sense organs: Secondary | ICD-10-CM | POA: Diagnosis not present

## 2022-03-14 DIAGNOSIS — G510 Bell's palsy: Secondary | ICD-10-CM

## 2022-03-14 DIAGNOSIS — R200 Anesthesia of skin: Secondary | ICD-10-CM

## 2022-03-14 MED ORDER — GADOPICLENOL 0.5 MMOL/ML IV SOLN
6.0000 mL | Freq: Once | INTRAVENOUS | Status: AC | PRN
  Administered 2022-03-14: 6 mL via INTRAVENOUS

## 2022-03-16 ENCOUNTER — Telehealth: Payer: Self-pay | Admitting: *Deleted

## 2022-03-16 NOTE — Telephone Encounter (Signed)
Spoke to mom (checked DPR) Gave MRI results . Informed mom per Dr.Athar please keep appointment with Dr Krista Blue next month Mom expressed understanding and thanked me for calling

## 2022-03-16 NOTE — Telephone Encounter (Signed)
-----  Message from Star Age, MD sent at 03/16/2022  7:48 AM EST ----- Please call patient or patient's mom regarding her brain MRI and cervical spine MRI.  Findings were benign, no abnormal lesions were seen.  There was some evidence of a prior Bell's palsy but other than that there were no abnormal findings and both MRI scans were overall reassuring.  Please remind her to follow-up as scheduled with Dr. Krista Blue next month.

## 2022-03-18 ENCOUNTER — Encounter: Payer: Self-pay | Admitting: Occupational Therapy

## 2022-03-18 ENCOUNTER — Ambulatory Visit: Payer: Medicaid Other | Admitting: Occupational Therapy

## 2022-03-18 ENCOUNTER — Encounter: Payer: Self-pay | Admitting: Physical Therapy

## 2022-03-18 ENCOUNTER — Ambulatory Visit: Payer: Medicaid Other | Admitting: Physical Therapy

## 2022-03-18 DIAGNOSIS — R29818 Other symptoms and signs involving the nervous system: Secondary | ICD-10-CM

## 2022-03-18 DIAGNOSIS — M6281 Muscle weakness (generalized): Secondary | ICD-10-CM | POA: Diagnosis not present

## 2022-03-18 DIAGNOSIS — R2689 Other abnormalities of gait and mobility: Secondary | ICD-10-CM

## 2022-03-18 DIAGNOSIS — R2681 Unsteadiness on feet: Secondary | ICD-10-CM

## 2022-03-18 DIAGNOSIS — R208 Other disturbances of skin sensation: Secondary | ICD-10-CM

## 2022-03-18 NOTE — Therapy (Signed)
OUTPATIENT OCCUPATIONAL THERAPY NEURO TREATMENT  Patient Name: Savannah Alvarez MRN: [id number] DOB:06-Sep-2000, 22 y.o., female Today's Date: 03/18/2022  PCP: Remo Lipps, PA  REFERRING PROVIDER: [place], Eric J, DO  END OF SESSION:  OT End of Session - 03/18/22 0849     Visit Number 3    Number of Visits 9    Date for OT Re-Evaluation 04/17/22    Authorization Type [place] Medicaid    Authorization Time Period approved 8 visits 1/17 - 05/23/22    Authorization - Visit Number 2    Authorization - Number of Visits 8    OT Start Time 0845    OT Stop Time 0930    OT Time Calculation (min) 45 min    Activity Tolerance Patient tolerated treatment well    Behavior During Therapy [facility] for tasks assessed/performed             Past Medical History:  Diagnosis Date   Eczema    Past Surgical History:  Procedure Laterality Date   HIP SURGERY Right    Patient Active Problem List   Diagnosis Date Noted   Protein-calorie malnutrition, severe 01/31/2022   Guillain Barr syndrome (HCC) 01/29/2022   Anemia 01/29/2022   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   Gait abnormality 01/28/2022   Angioedema 09/04/2013   Swelling of face 09/04/2013    ONSET DATE: 01/28/2022 (date of GB diagnosis)  REFERRING DIAG: G61.0 (ICD-10-CM) - 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 female Status post right periacetabular osteotomy on December 15, 2021 Acute onset of bilateral upper and lower extremity distal paresthesia, weakness, gait abnormality,  frequent fall around December 22, 2021, reached its nadir about 4 weeks after onset,             At her worst, she has shortness of breath, bilateral facial weakness, palpitation,             Mild improvement since, but still has moderate bulbar weakness, moderate distal limb weakness, areflexia, distal  sensory loss, gait abnormality             EMG nerve conduction study confirmed acute demyelinating polyradiculoneuropathy, mild axonal component,             She received inpatient IVIG 2 g/kg from January 28, 2010 2023, reported significant improvement, Spinal fluid testing January 29, 2022 showed normal total protein of 45, elevated IgG index            She is still at the peak of the benefit from her first round of IVIG treatment, she has moderate distal weakness, gait abnormality, sensory loss, need further treatment, start outpatient IVIG preauthorization"  PRECAUTIONS: Fall and Other: impaired sensation to light touch  WEIGHT BEARING RESTRICTIONS: No  PAIN:  Are you having pain? No, but does report pain at night or when first waking up in hands/feet up to 9/10 - shooting, numbness and tingling pain  FALLS: Has patient fallen in last 6 months? Yes. Number of falls 7 the week after surgery  LIVING ENVIRONMENT: Lives with:  mother Lives in: House/apartment; walk-in Stairs: Yes: External: 6 steps; bilateral but cannot  reach both Has following equipment at home: Gilford Rile - 2 wheeled, [place], Wheelchair (manual), shower chair, and bed side commode  Was living with sister Lives in: 1st level apartment; tub shower Stairs: Yes: External: 4 steps; bilateral can reach both OR 1 step in back without rails  PLOF: Independent; driving; was working before surgery  PATIENT GOALS: Wants to get back to lifting weights  OBJECTIVE:   HAND DOMINANCE: Right  ADLs: Overall ADLs: mod I with increased time  IADLs: Light housekeeping: max A Meal Prep: warms some items up but is not  cooking Medication management: mod I Handwriting: 25% legible  MOBILITY STATUS: Independent   ACTIVITY TOLERANCE: Activity tolerance: fair  UPPER EXTREMITY ROM:     AROM Right (eval) Left (eval)  Shoulder flexion WNL WNL  Shoulder abduction WNL WNL  Elbow flexion WNL WNL  Elbow extension WNL WNL  Wrist flexion WNL WNL  Wrist extension WNL WNL  Wrist pronation WNL WNL  Wrist supination WNL WNL   Digit Composite Flexion WNL WNL  Digit Composite Extension WNL WNL  Digit Opposition WNL WNL  (Blank rows = not tested)   UPPER EXTREMITY MMT:   (fair endurance)  MMT Right (eval) Left (eval)  Shoulder flexion WNL WNL  Shoulder abduction WNL WNL  Elbow flexion WNL WNL  Elbow extension WNL WNL  (Blank rows = not tested)  HAND FUNCTION: Grip strength: Right: 43.6 lbs; Left: 46.7 lbs  COORDINATION: 9 Hole Peg test: Right: 35 sec; Left: 31 sec  SENSATION: Light touch: Impaired  and only able to distinguish light touch with eyes occluded to R 4th and 5th digits as well as L 4th digit palmarly. Pt reports numbness in both hands especially in palms and 4th and 5th digits  EDEMA: none  MUSCLE TONE: RUE: Within functional limits and LUE: Within functional limits  COGNITION: Overall cognitive status: Within functional limits for tasks assessed  VISION: Subjective report: no changes Baseline vision:  wears glasses for driving only  PERCEPTION: WFL  PRAXIS: WFL  OBSERVATIONS: Pt ambulates from lobby to treatment room with increased time and no AD. No LOB. She appears well kept and is accompanied by her mother.   TODAY'S TREATMENT:                                                                                                                               Fluidotherapy x 10 minutes for Lt hand to stiffness and sensory retraining (set at 110*)  Reviewed recommendations from last session with pt's mother and re-issued handouts  Pt issued putty HEP - see pt instructions  for details. Pt issued green resistance putty  UBE x 6 mintues, level 3 for UE strength/endurance w/ min to mod fatigue level (partially d/t recent IVG infusions)     PATIENT EDUCATION: Education details: see above Person educated: Patient Education method: Theatre stage manager Education comprehension: verbalized understanding  HOME EXERCISE PROGRAM: N/a  GOALS:  SHORT  TERM GOALS: Target date: 03/20/2022   Pt will be able to independently recall at least 3 safety precautions secondary to sensory impairments. Baseline: able to recall one independently Goal status: MET  2.  Pt will be independent with sensory HEP for BUE.  Baseline: not yet initiated Goal status: MET   LONG TERM GOALS: Target date: 04/17/2022    Pt will be independent with updated BUE HEP for strengthening and coordination.  Baseline: not yet initiated Goal status: INITIAL  2.  Pt will cook light, stovetop meal prep with SBA demonstrating good safety awareness and use of AD as needed.  Baseline: Pt dependent with stovetop meal prep with poor insight to sensory precautions. Goal status: INITIAL  3.  Pt will report handwriting at at least 75% of PLOF with use of R dominant hand and use of AD as needed.  Baseline: handwriting less than 25% of PLOF with use of R dominant hand and no use of AD. Goal status: INITIAL   ASSESSMENT:  CLINICAL IMPRESSION: Pt progressing clinically. Pt has met both STG's. Limited by decreased sensation.   PERFORMANCE DEFICITS: in functional skills including ADLs, IADLs, coordination, sensation, strength, pain, Fine motor control, body mechanics, decreased knowledge of precautions, decreased knowledge of use of DME, and UE functional use.  IMPAIRMENTS: are limiting patient from ADLs, IADLs, work, leisure, and social participation.   CO-MORBIDITIES: may have co-morbidities  that affects occupational performance. Patient will benefit from skilled OT to address above impairments  and improve overall function.  MODIFICATION OR ASSISTANCE TO COMPLETE EVALUATION: No modification of tasks or assist necessary to complete an evaluation.  OT OCCUPATIONAL PROFILE AND HISTORY: Problem focused assessment: Including review of records relating to presenting problem.  CLINICAL DECISION MAKING: LOW - limited treatment options, no task modification necessary  REHAB POTENTIAL: Good  EVALUATION COMPLEXITY: Low    PLAN:  OT FREQUENCY: 1x/week  OT DURATION: 8 weeks  PLANNED INTERVENTIONS: self care/ADL training, therapeutic exercise, therapeutic activity, neuromuscular re-education, functional mobility training, electrical stimulation, ultrasound, fluidotherapy, patient/family education, coping strategies training, DME and/or AE instructions, and Re-evaluation  RECOMMENDED OTHER SERVICES: None at this time  CONSULTED AND AGREED WITH PLAN OF CARE: Patient and family member/caregiver  PLAN FOR NEXT SESSION: continue fluidotherapy at lower setting, review putty HEP, UBE  Check all possible CPT codes: 97110- Therapeutic Exercise, [phone]- Neuro Re-education, 97140 - Manual Therapy, 97530 - Therapeutic Activities, 97535 - Self Care, 97014 - Electrical stimulation (unattended), [id number] - Electrical stimulation (Manual), and [id number] - Ultrasound    Check all conditions that are expected to impact treatment: Neurological condition   If treatment provided at initial evaluation, no treatment charged due to lack of authorization.       Hans Eden, OT 03/18/2022, 8:51 AM

## 2022-03-18 NOTE — Patient Instructions (Signed)
  1. Grip Strengthening (Resistive Putty)   Squeeze putty using thumb and all fingers. Repeat _20___ times. Do __2__ sessions per day.   2. Roll putty into tube on table and pinch between first two fingers and thumb x 10 reps. Do 2 sessions per day     Copyright  VHI. All rights reserved.

## 2022-03-18 NOTE — Therapy (Signed)
OUTPATIENT PHYSICAL THERAPY NEURO TREATMENT   Patient Name: Savannah Alvarez MRN: [id number] DOB:2000-09-07, 22 y.o., female Today's Date: 03/18/2022   PCP: Jeannene Patella, PA  REFERRING PROVIDER:  [place] Eric J, DO  END OF SESSION:  PT End of Session - 03/18/22 0803     Visit Number 5    Number of Visits 9    Date for PT Re-Evaluation 04/21/22    Authorization Type [place] Medicaid [employer] - 8 visits approved 02/25/22 - 05/27/22    Authorization - Visit Number 4    Authorization - Number of Visits 8    PT Start Time 0802    PT Stop Time 0843    PT Time Calculation (min) 41 min    Activity Tolerance Patient tolerated treatment well    Behavior During Therapy [facility] for tasks assessed/performed               Past Medical History:  Diagnosis Date   Eczema    Past Surgical History:  Procedure Laterality Date   HIP SURGERY Right    Patient Active Problem List   Diagnosis Date Noted   Protein-calorie malnutrition, severe 01/31/2022   Guillain Barr syndrome ([place]) 01/29/2022   Anemia 01/29/2022   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   Gait abnormality 01/28/2022   Angioedema 09/04/2013   Swelling of face 09/04/2013    ONSET DATE: 02/02/2022  REFERRING DIAG: G61.0 (ICD-10-CM) - 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 Got results of MRI back recently and it was normal.   Pt accompanied by: self   PERTINENT HISTORY: Savannah Alvarez is a 22  y.o. 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   Patient reports on October 30, she woke up with a tingling sensation in bilateral hands and feet which progressively worsened to all extremities.  She reports 7 falls since mid November and difficulty holding things in her hands such as utensils.  She was seen by neurology outpatient, Dr.  Terrace Arabia and had EMG-NCS performed which confirmed acute demyelinating polyradiculoneuropathy, mild axonal component and she was directed to the ED for further evaluation and treatment with IVIG versus Plex and LP. Pt treated with IVIG infusions. Discharged 02/02/22.  PAIN:  Are you having pain? No  PRECAUTIONS: Fall  FALLS: Has patient fallen in last 6 months? Yes. 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 Can just detect around knees. Proprioception: WFL for ankle PF/DF bilat   Can detect deeper pressure more proximally, unable to  detect at ankles   COORDINATION: Heel to shin: slower and weaker to perform with RLE     POSTURE: rounded shoulders and forward head    LOWER EXTREMITY MMT:    MMT Right Eval Left Eval  Hip flexion 3- 5  Hip extension 3- 4  Hip abduction 3- 5  Hip adduction    Hip internal rotation    Hip external rotation    Knee flexion 5 5  Knee extension 5 5  Ankle dorsiflexion 5 5  Ankle plantarflexion    Ankle inversion    Ankle eversion    (Blank rows = not tested)    TODAY'S TREATMENT:         Therapeutic Activity  Goal Assessment      [facility] PT Assessment - 03/18/22 0805       Ambulation/Gait   Gait velocity 9.3 seconds = 3.52 ft/sec      Functional Gait  Assessment   Gait assessed  Yes    Gait Level Surface Walks 20 ft in less than 5.5 sec, no assistive devices, good speed, no evidence for imbalance, normal gait pattern, deviates no more than 6 in outside of the 12 in walkway width.    Change in Gait Speed Able to smoothly change walking speed without loss of balance or gait deviation. Deviate no more than 6 in outside of the 12 in walkway width.    Gait with Horizontal Head Turns Performs head turns smoothly with no change in gait. Deviates no more than 6 in outside 12 in walkway width    Gait with Vertical Head Turns Performs head turns with no change in gait. Deviates no more than 6 in outside 12 in walkway width.    Gait and Pivot Turn Pivot turns safely within 3 sec and stops quickly with no loss of balance.    Step Over Obstacle Is able to step over 2 stacked shoe boxes taped together (9 in total height) without changing gait speed. No evidence of imbalance.    Gait with Narrow Base of Support Is able to ambulate for 10 steps heel to toe with no staggering.    Gait with Eyes Closed Walks 20 ft,  uses assistive device, slower speed, mild gait deviations, deviates 6-10 in outside 12 in walkway width. Ambulates 20 ft in less than 9 sec but greater than 7 sec.   7.5 seconds    Ambulating Backwards Walks 20 ft, no assistive devices, good speed, no evidence for imbalance, normal gait    Steps Alternating feet, no rail.    Total Score 29    FGA comment: 29/30 = No Fall Risk             Ther Ex  Elliptical level 1 for 2 minutes in fwd direction and 2 minutes in backwards direction for improved BLE strength, endurance and cardiovascular warmup. 4 minutes total on Elliptical  Double leg presses, 2 x 15 reps at 110#, for improved BLE strength. Progressed to 10 reps of single leg presses at 70# to target single leg stability and hip position.   Deep squat/yogi squat down to 2 yoga blocks and then back upright, 2 sets of 5 reps, pt with no pain when performing and fatigued easily with this  With single UE support, 10 reps 90/90 lunges each leg, pt with RLE fatigue and incr muscle shaking Progressed monster walks and side stepping to use of green band for HEP, pt preferred band around her ankles as it was more challenging than around her thighs   GAIT: Gait pattern: step through pattern, decreased step length- Left, decreased stance time- Right, and antalgic Distance walked: Clinic distances Assistive device utilized: None Level of assistance: Complete Independence   PATIENT EDUCATION: Education details: Continue HEP and progressed to use of green t-band for side steping and monster walks, begin walking on the treadmill at home for aerobic activity/endurance Person educated: Patient Education method: Medical illustrator Education comprehension: verbalized understanding and returned demonstration  HOME EXERCISE PROGRAM: Access Code: [email or web address] URL: [email or web address] Date: 02/25/2022 Prepared by: Alethia Berthold Plaster  Exercises - Feet Together Balance Eyes Closed on Unstable Surface  - 1 x daily - 7 x weekly - 3-4 reps - 15-30 second hold - Narrow Stance with Eyes Closed and Head Rotation  - 1 x daily - 7 x weekly - 3 sets - 10 reps -  Side Stepping with Resistance at Thighs  - 1 x daily - 7 x weekly - 3-4 reps - Forward Backward Monster Walk with Band at [employer] and Counter Support  - 1 x daily - 7 x weekly - 3-4 reps - Modified Single-Leg Deadlift   - 1 x daily - 7 x weekly - 3 sets - 10 reps - Seated Hamstring Stretch  - 1 x daily - 7 x weekly - 3 sets - 10 reps - Hip Flexor Stretch at Edge of Bed  - 1 x daily - 7 x weekly - 3-4 reps - 30-60 second hold  Pt given exercises for hip strengthening from ortho PT the other day, educated to continue to work on those.   GOALS: Goals reviewed with patient? Yes  SHORT TERM GOALS: Target date: 03/20/2022  Pt will be independent with initial HEP in order to build upon functional gains made in therapy. Baseline: Pt reports consistently performing exercises  Goal status: INITIAL  2.  Pt will undergo further testing of mCTSIB with LTG written.  Baseline: Goal not needed.  Goal status: MET  3.  Pt will improve FGA to at least a 24/30 in order to demo decr fall risk. Baseline: 19/30; 29/30 Goal status: MET  4.  Pt will improve gait speed with no AD to  at least 2.9 ft/sec in order to demo improved community mobility.  Baseline: 13.25 seconds = 2.47 ft/sec, 9.3 seconds = 3.52 ft/sec  Goal status: MET    LONG TERM GOALS: Target date: 04/17/2022  Pt will be independent with final HEP in order to build upon functional gains made in therapy. Baseline: dependent Goal status: INITIAL  2.  Pt will improve gait speed with no AD to at least 3.5 ft/sec in order to demo improved community mobility.  Baseline: 13.25 seconds = 2.47 ft/sec; 3.52 ft/sec on 03/18/22 Goal status: MET  3.   Pt will improve FGA to at least a 29/30 in order to demo decr fall risk. Baseline: 19/30; 29/30  Goal status: MET  4.  mCTSIB goal to be written as appropriate.  Baseline: Dc'd as pt scored 120/120 on 02/25/22  Goal status: MET  5.  Pt will improve R hip ABD and hip extensor strength to at least a 4/5  in order to demo improved functional strength for daily activities.  Baseline: 3- for both  Goal status: INITIAL  6.  Pt will verbalize return to the gym and weight lifting (as appropriate) Baseline: Not going to the gym  Goal status: INITIAL  ASSESSMENT:  CLINICAL IMPRESSION: Today's skilled session focused on assessing pt's STGs. Pt has met all STGs. Pt scored a 29/30 on the FGA (previously 19/30), indicating pt is no longer at a risk for falls. 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 Pt has also met her LTGs in regards to gait speed/FGA. Remainder of session focused on BLE strengthening (RLE>LLE). Pt tolerated session well, but did fatigue with deeper squats and 90/90 lunges with RLE. Pt needing intermittent seated rest breaks due to fatigue. Will continue to progress towards LTGs.    OBJECTIVE IMPAIRMENTS: Abnormal gait, decreased activity tolerance, decreased balance, decreased coordination, decreased knowledge of condition, decreased mobility, difficulty walking, decreased ROM, decreased strength, impaired sensation, and postural dysfunction.   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   REHAB POTENTIAL: Good  CLINICAL DECISION MAKING: Stable/uncomplicated  EVALUATION COMPLEXITY: Low  PLAN:  PT FREQUENCY: 1x/week  PT DURATION: 8 weeks  PLANNED INTERVENTIONS: Therapeutic exercises, Therapeutic activity, Neuromuscular re-education, Balance training, Gait training, Patient/Family education, Self Care, Vestibular training, Manual therapy, and Re-evaluation  PLAN  FOR NEXT SESSION: RLE hip strengthening, SLS tasks. Unlevel surfaces, surge walking/DL, weightlifting, leg presses, elliptical    Arliss Journey, PT, DPT 03/18/2022, 8:46 AM   Check all possible CPT codes: 93716 - PT Re-evaluation, 97110- Therapeutic Exercise, [phone]- Neuro Re-education, [phone] - Gait Training, [phone] - Manual Therapy, [phone] - Therapeutic Activities, and [phone] - Self Care    Check all conditions that are expected to impact treatment: Neurological condition   If treatment provided at initial evaluation, no treatment charged due to lack of authorization.

## 2022-03-25 ENCOUNTER — Encounter: Payer: Self-pay | Admitting: Physical Therapy

## 2022-03-25 ENCOUNTER — Ambulatory Visit: Payer: Medicaid Other | Admitting: Occupational Therapy

## 2022-03-25 ENCOUNTER — Ambulatory Visit: Payer: Medicaid Other | Admitting: Physical Therapy

## 2022-03-25 DIAGNOSIS — R2681 Unsteadiness on feet: Secondary | ICD-10-CM

## 2022-03-25 DIAGNOSIS — M6281 Muscle weakness (generalized): Secondary | ICD-10-CM | POA: Diagnosis not present

## 2022-03-25 DIAGNOSIS — R2689 Other abnormalities of gait and mobility: Secondary | ICD-10-CM

## 2022-03-25 DIAGNOSIS — R208 Other disturbances of skin sensation: Secondary | ICD-10-CM

## 2022-03-25 DIAGNOSIS — R29818 Other symptoms and signs involving the nervous system: Secondary | ICD-10-CM

## 2022-03-25 NOTE — Therapy (Signed)
OUTPATIENT OCCUPATIONAL THERAPY NEURO TREATMENT  Patient Name: Savannah Alvarez MRN: [id number] DOB:2000-08-18, 22 y.o., female Today's Date: 03/25/2022  PCP: Jeannene Patella, PA  REFERRING PROVIDER: [place] Eric J, DO  END OF SESSION:  OT End of Session - 03/25/22 0849     Visit Number 4    Number of Visits 9    Date for OT Re-Evaluation 04/17/22    Authorization Type [place] Medicaid    Authorization Time Period approved 8 visits 1/17 - 05/23/22    Authorization - Visit Number 3    Authorization - Number of Visits 8    OT Start Time 0848    OT Stop Time 0930    OT Time Calculation (min) 42 min    Activity Tolerance Patient tolerated treatment well    Behavior During Therapy WFL for tasks assessed/performed             Past Medical History:  Diagnosis Date   Eczema    Past Surgical History:  Procedure Laterality Date   HIP SURGERY Right    Patient Active Problem List   Diagnosis Date Noted   Protein-calorie malnutrition, severe 01/31/2022   Guillain Barr syndrome ([place]) 01/29/2022   Anemia 01/29/2022   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   Gait abnormality 01/28/2022   Angioedema 09/04/2013   Swelling of face 09/04/2013    ONSET DATE: 01/28/2022 (date of GB diagnosis)  REFERRING DIAG: G61.0 (ICD-10-CM) - 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 female Status post right periacetabular osteotomy on December 15, 2021 Acute onset of bilateral upper and lower extremity distal paresthesia,  weakness, gait abnormality, frequent fall around December 22, 2021, reached its nadir about 4 weeks after onset,             At her worst, she has shortness of breath, bilateral facial weakness, palpitation,             Mild improvement since, but still has moderate bulbar weakness, moderate distal limb weakness, areflexia, distal  sensory loss, gait abnormality             EMG nerve conduction study confirmed acute demyelinating polyradiculoneuropathy, mild axonal component,             She received inpatient IVIG 2 g/kg from January 28, 2010 2023, reported significant improvement, Spinal fluid testing January 29, 2022 showed normal total protein of 45, elevated IgG index            She is still at the peak of the benefit from her first round of IVIG treatment, she has moderate distal weakness, gait abnormality, sensory loss, need further treatment, start outpatient IVIG preauthorization"  PRECAUTIONS: Fall and Other: impaired sensation to light touch  WEIGHT BEARING RESTRICTIONS: No  PAIN:  Are you having pain? No, but does report pain at night or when first waking up in hands/feet up to 9/10 - shooting, numbness and tingling pain  FALLS: Has patient fallen in last 6 months? Yes. Number of falls 7 the week after surgery  LIVING ENVIRONMENT: Lives with:  mother Lives in: House/apartment; walk-in Stairs: Yes: External: 6  steps; bilateral but cannot reach both Has following equipment at home: Gilford Rile - 2 wheeled, Crutches, Wheelchair (manual), shower chair, and bed side commode  Was living with sister Lives in: 1st level apartment; tub shower Stairs: Yes: External: 4 steps; bilateral can reach both OR 1 step in back without rails  PLOF: Independent; driving; was working before surgery  PATIENT GOALS: Wants to get back to lifting weights  OBJECTIVE:   HAND DOMINANCE: Right  ADLs: Overall ADLs: mod I with increased time  IADLs: Light housekeeping: max A Meal Prep: warms some items  up but is not cooking Medication management: mod I Handwriting: 25% legible  MOBILITY STATUS: Independent   ACTIVITY TOLERANCE: Activity tolerance: fair  UPPER EXTREMITY ROM:     AROM Right (eval) Left (eval)  Shoulder flexion WNL WNL  Shoulder abduction WNL WNL  Elbow flexion WNL WNL  Elbow extension WNL WNL  Wrist flexion WNL WNL  Wrist extension WNL WNL  Wrist pronation WNL WNL  Wrist supination WNL WNL   Digit Composite Flexion WNL WNL  Digit Composite Extension WNL WNL  Digit Opposition WNL WNL  (Blank rows = not tested)   UPPER EXTREMITY MMT:   (fair endurance)  MMT Right (eval) Left (eval)  Shoulder flexion WNL WNL  Shoulder abduction WNL WNL  Elbow flexion WNL WNL  Elbow extension WNL WNL  (Blank rows = not tested)  HAND FUNCTION: Grip strength: Right: 43.6 lbs; Left: 46.7 lbs  COORDINATION: 9 Hole Peg test: Right: 35 sec; Left: 31 sec  SENSATION: Light touch: Impaired  and only able to distinguish light touch with eyes occluded to R 4th and 5th digits as well as L 4th digit palmarly. Pt reports numbness in both hands especially in palms and 4th and 5th digits  EDEMA: none  MUSCLE TONE: RUE: Within functional limits and LUE: Within functional limits  COGNITION: Overall cognitive status: Within functional limits for tasks assessed  VISION: Subjective report: no changes Baseline vision:  wears glasses for driving only  PERCEPTION: WFL  PRAXIS: WFL  OBSERVATIONS: Pt ambulates from lobby to treatment room with increased time and no AD. No LOB. She appears well kept and is accompanied by her mother.   TODAY'S TREATMENT:                                                                                                                               Pt did not wish to do fluidotherapy today as heat (even at lower setting) bothers her.   Reviewed putty HEP and previous A/E recommendations and task modifications for safety.   UBE x 5 mintues, level  5 for UE strength/endurance   Issued theraband HEP - see pt instructions for details   PATIENT EDUCATION: Education details: theraband HEP  Person educated: Patient Education method: Theatre stage manager, demo Education comprehension: verbalized understanding, return demo   [place]: 03/11/22: Putty HEP 03/25/22: Theraband HEP   GOALS:  SHORT TERM GOALS: Target date: 03/20/2022   Pt will be able to independently recall at least 3 safety precautions secondary to sensory impairments. Baseline: able to recall one independently Goal status: MET  2.  Pt will be independent with sensory HEP for BUE.  Baseline: not yet initiated Goal status: MET   LONG TERM GOALS: Target date: 04/17/2022    Pt will be independent with updated BUE HEP for strengthening and coordination.  Baseline: not yet initiated Goal status: IN PROGRESS  2.  Pt will cook light, stovetop meal prep with SBA demonstrating good safety awareness and use of AD as needed.  Baseline: Pt dependent with stovetop meal prep with poor insight to sensory precautions. Goal status: IN PROGRESS  3.  Pt will report handwriting at at least 75% of PLOF with use of R dominant hand and use of AD as needed.  Baseline: handwriting less than 25% of PLOF with use of R dominant hand and no use of AD. Goal status: INITIAL   ASSESSMENT:  CLINICAL IMPRESSION: Pt progressing clinically. Pt has met both STG's. Limited by decreased sensation.   PERFORMANCE DEFICITS: in functional skills including ADLs, IADLs, coordination, sensation, strength, pain, Fine motor control, body mechanics, decreased knowledge of precautions, decreased knowledge of use of DME, and UE functional use.  IMPAIRMENTS: are limiting patient from ADLs, IADLs, work, leisure, and social participation.   CO-MORBIDITIES: may have co-morbidities  that affects occupational performance. Patient will benefit from skilled OT to address above impairments and  improve overall function.  MODIFICATION OR ASSISTANCE TO COMPLETE EVALUATION: No modification of tasks or assist necessary to complete an evaluation.  OT OCCUPATIONAL PROFILE AND HISTORY: Problem focused assessment: Including review of records relating to presenting problem.  CLINICAL DECISION MAKING: LOW - limited treatment options, no task modification necessary  REHAB POTENTIAL: Good  EVALUATION COMPLEXITY: Low    PLAN:  OT FREQUENCY: 1x/week  OT DURATION: 8 weeks  PLANNED INTERVENTIONS: self care/ADL training, therapeutic exercise, therapeutic activity, neuromuscular re-education, functional mobility training, electrical stimulation, ultrasound, fluidotherapy, patient/family education, coping strategies training, DME and/or AE instructions, and Re-evaluation  RECOMMENDED OTHER SERVICES: None at this time  CONSULTED AND AGREED WITH PLAN OF CARE: Patient and family member/caregiver  PLAN FOR NEXT SESSION: Review theraband HEP PRN, UBE, coordination and handwriting, anticipate d/c in next 1-2 visits  Check all possible CPT codes: 97110- Therapeutic Exercise, [phone]- Neuro Re-education, 97140 - Manual Therapy, 97530 - Therapeutic Activities, [id number] - Self Care, 97014 - Electrical stimulation (unattended), [id number] - Electrical stimulation (Manual), and [id number] - Ultrasound    Check all conditions that are expected to impact treatment: Neurological condition   If treatment provided at initial evaluation, no treatment charged due to lack of authorization.       Hans Eden, OT 03/25/2022, 8:50 AM

## 2022-03-25 NOTE — Therapy (Signed)
OUTPATIENT PHYSICAL THERAPY NEURO TREATMENT   Patient Name: Savannah Alvarez MRN: [id number] DOB:[birth year], 22 y.o., female Today's Date: 03/25/2022   PCP: Remo Lipps, PA  REFERRING PROVIDER:  [place], Eric J, DO  END OF SESSION:  PT End of Session - 03/25/22 0802     Visit Number 6    Number of Visits 9    Date for PT Re-Evaluation 04/21/22    Authorization Type [place] [employer] - 8 visits approved 02/25/22 - 05/27/22    Authorization - Visit Number 5    Authorization - Number of Visits 8    PT Start Time 0800    PT Stop Time 0842    PT Time Calculation (min) 42 min    Activity Tolerance Patient tolerated treatment well    Behavior During Therapy [facility] for tasks assessed/performed               Past Medical History:  Diagnosis Date   Eczema    Past Surgical History:  Procedure Laterality Date   HIP SURGERY Right    Patient Active Problem List   Diagnosis Date Noted   Protein-calorie malnutrition, severe 01/31/2022   Guillain Barr syndrome (HCC) 01/29/2022   Anemia 01/29/2022   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   Gait abnormality 01/28/2022   Angioedema 09/04/2013   Swelling of face 09/04/2013    ONSET DATE: 02/02/2022  REFERRING DIAG: G61.0 (ICD-10-CM) - 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 Has not been sleeping a lot. Has not gotten a chance to go to the gym. Reports her deep squats has been getting better, has  been able to hold this stretch position.   Pt accompanied by: self   PERTINENT HISTORY: Savannah Alvarez is a 22 y.o. 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   Patient reports on October 30, she woke up with a tingling sensation in bilateral hands and feet which progressively worsened to all extremities.  She  reports 7 falls since mid November and difficulty holding things in her hands such as utensils.  She was seen by neurology outpatient, Dr. Krista Blue and had EMG-NCS performed which confirmed acute demyelinating polyradiculoneuropathy, mild axonal component and she was directed to the ED for further evaluation and treatment with IVIG versus Plex and LP. Pt treated with IVIG infusions. Discharged 02/02/22.  PAIN:  Are you having pain? No  PRECAUTIONS: Fall  FALLS: Has patient fallen in last 6 months? Yes. 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 Can just detect around  knees. Proprioception: WFL for ankle PF/DF bilat   Can detect deeper pressure more proximally, unable to detect at ankles   COORDINATION: Heel to shin: slower and weaker to perform with RLE     POSTURE: rounded shoulders and forward head    LOWER EXTREMITY MMT:    MMT Right Eval Left Eval  Hip flexion 3- 5  Hip extension 3- 4  Hip abduction 3- 5  Hip adduction    Hip internal rotation    Hip external rotation    Knee flexion 5 5  Knee extension 5 5  Ankle dorsiflexion 5 5  Ankle plantarflexion    Ankle inversion    Ankle eversion    (Blank rows = not tested)    TODAY'S TREATMENT:         Ther Ex  Elliptical level 1 for 3 minutes in fwd direction and 2 minutes in backwards direction for improved BLE strength, endurance and cardiovascular warmup. 5 minutes total on Elliptical  On blue side of BOSU:  Alternating forward step up with contralateral march x10 reps each side With front leg on BOSU 2 sets of 10 mini lunges, with intermittent UE support for balance Lateral lunges x12 reps each leg with 5 second hold for additional inner thigh stretch Hip ADD butterfly stretch 2 x 30 seconds  Lizard lunge 2 x 30 seconds bilat for hip flexor/hip ADD strengthening In quadruped position: Hip extension raises with leg straight 10 reps bilat Hip ABD diagonal leg lifts 10 reps each side, initial verbal/tactile cues for technique and alignment  Wide leg squats with toes turned out, 10 reps with 10# with 2 sets of 5 pulses at end range, repeated 10 reps with use of 15#. Pt with incr muscle shakes when doing pulses.   GAIT: Gait pattern: step through pattern, decreased step length- Left, decreased stance time- Right, and antalgic Distance walked: Clinic distances Assistive device utilized: None Level of assistance: Complete Independence   PATIENT EDUCATION: Education details: Trying additional hip ADD stretches at home Person educated: Patient Education method: Holiday representative Education comprehension: verbalized understanding and returned demonstration  HOME EXERCISE PROGRAM: Access Code:  [email or web address] URL: [email or web address] Date: 02/25/2022 Prepared by: Mickie Bail Plaster  Exercises - Feet Together Balance Eyes Closed on Unstable Surface  - 1 x daily - 7 x weekly - 3-4 reps - 15-30 second hold - Narrow Stance with Eyes Closed and Head Rotation  - 1 x daily - 7 x weekly - 3 sets - 10 reps - Side Stepping with Resistance at Thighs  - 1 x daily - 7 x weekly - 3-4 reps - Forward Backward Monster Walk with Band at [employer] and Counter Support  - 1 x daily - 7 x weekly - 3-4 reps - Modified Single-Leg Deadlift   - 1 x daily - 7 x weekly - 3 sets - 10 reps - Seated Hamstring Stretch  - 1 x daily - 7 x weekly - 3 sets - 10 reps - Hip Flexor Stretch at Edge of Bed  - 1 x daily - 7 x weekly - 3-4 reps - 30-60 second hold  Pt given exercises for hip strengthening from ortho PT the other day, educated to continue to work on those.   GOALS: Goals reviewed with patient? Yes  SHORT TERM GOALS: Target date: 03/20/2022  Pt will be independent with initial HEP in order to build upon functional gains made in therapy. Baseline: Pt reports consistently performing exercises  Goal status: MET  2.  Pt will undergo further testing of mCTSIB with LTG written.  Baseline: Goal not needed.  Goal status: MET  3.  Pt will improve FGA to at least a 24/30 in order to demo decr fall risk. Baseline: 19/30; 29/30 Goal status: MET  4.  Pt will improve gait speed with no AD to at least 2.9 ft/sec in order to demo improved community mobility.  Baseline: 13.25 seconds = 2.47 ft/sec, 9.3 seconds = 3.52 ft/sec  Goal status: MET    LONG TERM GOALS: Target date: 04/17/2022  Pt will be independent with final HEP in order to build upon functional gains made in therapy. Baseline: dependent Goal status: INITIAL  2.  Pt will improve gait speed with no AD to at least  3.5 ft/sec in order to demo improved community mobility.  Baseline: 13.25 seconds = 2.47 ft/sec; 3.52 ft/sec on 03/18/22 Goal status: MET  3.   Pt will improve FGA to at least a 29/30 in order to demo decr fall risk. Baseline: 19/30; 29/30  Goal status: MET  4.  mCTSIB goal to be written as appropriate.  Baseline: Dc'd as pt scored 120/120 on 02/25/22  Goal status: MET  5.  Pt will improve R hip ABD and hip extensor strength to at least a 4/5 in order to demo improved functional strength for daily activities.  Baseline: 3- for both  Goal status: INITIAL  6.  Pt will verbalize return to the gym and weight lifting (as appropriate) Baseline: Not going to the gym  Goal status: INITIAL  ASSESSMENT:  CLINICAL IMPRESSION: Today's skilled session focused on RLE>LLE strengthening, endurance on Elliptical, and stretching of R>L hip ADD. Pt reporting relief from hip ADD stretches, discussed adding these into her routine at home for improved mobility. Pt needing less rest breaks during session today. Will continue to progress towards LTGs.    OBJECTIVE IMPAIRMENTS: Abnormal gait, decreased activity tolerance, decreased balance, decreased coordination, decreased knowledge of condition, decreased mobility, difficulty walking, decreased ROM, decreased strength, impaired sensation, and postural dysfunction.   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   REHAB POTENTIAL: Good  CLINICAL DECISION MAKING: Stable/uncomplicated  EVALUATION COMPLEXITY: Low  PLAN:  PT FREQUENCY: 1x/week  PT DURATION: 8 weeks  PLANNED INTERVENTIONS:  Therapeutic exercises, Therapeutic activity, Neuromuscular re-education, Balance training, Gait training, Patient/Family education, Self Care, Vestibular training, Manual therapy, and Re-evaluation  PLAN FOR NEXT SESSION: RLE hip strengthening, SLS tasks. Unlevel surfaces, surge walking/DL, weightlifting, leg presses, elliptical    Arliss Journey, PT, DPT 03/25/2022, 8:43 AM   Check all possible CPT codes: 03500 - PT Re-evaluation, 97110- Therapeutic Exercise, [phone]- Neuro Re-education, [phone] - Gait Training, [phone] - Manual Therapy, [phone] - Therapeutic Activities, and [phone] - Self Care    Check all conditions that are expected to impact treatment: Neurological condition   If treatment provided at initial evaluation, no treatment charged due to lack of authorization.

## 2022-03-25 NOTE — Patient Instructions (Signed)
   Strengthening: Resisted Flexion   Hold tubing with __Rt, then Lt___ arm(s) at side. Pull forward and up. Move shoulder through pain-free range of motion. Repeat __10__ times per set.  Do _1-2_ sessions per day , every other day   Strengthening: Resisted Extension   Hold tubing in __both___ hand(s), arm forward. Pull arm back, elbow straight. Repeat _10___ times per set. Do _1-2___ sessions per day, every other day.   Resisted Horizontal Abduction: Bilateral   Sit or stand, tubing in both hands, arms out in front. Keeping arms straight, pinch shoulder blades together and stretch arms out. Repeat _10___ times per set. Do _1-2___ sessions per day, every other day.   Elbow Flexion: Resisted   With tubing held in ___both___ hand(s) and other end secured under foot, curl arm up as far as possible. Repeat _10___ times per set. Do _1-2___ sessions per day, every other day.    Elbow Extension: Resisted   Sit in chair with resistive band secured at armrest (or hold with other hand at opposite shoulder) and __Rt_____ elbow bent, then Lt. Straighten elbow. Repeat _10___ times per set.  Do _1-2___ sessions per day, every other day.

## 2022-04-01 ENCOUNTER — Encounter: Payer: Self-pay | Admitting: Physical Therapy

## 2022-04-01 ENCOUNTER — Encounter: Payer: Self-pay | Admitting: Occupational Therapy

## 2022-04-01 ENCOUNTER — Ambulatory Visit: Payer: Medicaid Other | Attending: Physician Assistant | Admitting: Occupational Therapy

## 2022-04-01 ENCOUNTER — Ambulatory Visit: Payer: Medicaid Other | Admitting: Physical Therapy

## 2022-04-01 DIAGNOSIS — R2681 Unsteadiness on feet: Secondary | ICD-10-CM

## 2022-04-01 DIAGNOSIS — M6281 Muscle weakness (generalized): Secondary | ICD-10-CM

## 2022-04-01 DIAGNOSIS — R29818 Other symptoms and signs involving the nervous system: Secondary | ICD-10-CM | POA: Insufficient documentation

## 2022-04-01 NOTE — Therapy (Signed)
OUTPATIENT OCCUPATIONAL THERAPY NEURO TREATMENT  Patient Name: Savannah Alvarez MRN: [id number] DOB:06/21/2000, 22 y.o., female Today's Date: 04/01/2022  PCP: Jeannene Patella, PA  REFERRING PROVIDER: [place] Eric J, DO  END OF SESSION:  OT End of Session - 04/01/22 0805     Visit Number 5    Number of Visits 9    Date for OT Re-Evaluation 04/17/22    Authorization Type [place] Medicaid    Authorization Time Period approved 8 visits 1/17 - 05/23/22    Authorization - Visit Number 4    Authorization - Number of Visits 8    OT Start Time 0805    OT Stop Time 0835    OT Time Calculation (min) 30 min    Activity Tolerance Patient tolerated treatment well    Behavior During Therapy [facility] for tasks assessed/performed             Past Medical History:  Diagnosis Date   Eczema    Past Surgical History:  Procedure Laterality Date   HIP SURGERY Right    Patient Active Problem List   Diagnosis Date Noted   Protein-calorie malnutrition, severe 01/31/2022   Guillain Barr syndrome ([place]) 01/29/2022   Anemia 01/29/2022   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[place]) 01/28/2022   Gait abnormality 01/28/2022   Angioedema 09/04/2013   Swelling of face 09/04/2013    ONSET DATE: 01/28/2022 (date of GB diagnosis)  REFERRING DIAG: G61.0 (ICD-10-CM) - 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 female Status post right periacetabular osteotomy on December 15, 2021 Acute onset of bilateral upper and lower extremity distal paresthesia, weakness, gait abnormality, frequent fall around December 22, 2021, reached its nadir about 4 weeks after onset,             At her worst, she has shortness of breath, bilateral facial weakness, palpitation,             Mild improvement since, but still has moderate bulbar weakness, moderate distal limb weakness, areflexia, distal  sensory loss, gait abnormality             EMG nerve conduction study confirmed acute demyelinating polyradiculoneuropathy, mild axonal component,             She received inpatient IVIG 2 g/kg from January 28, 2010 2023, reported significant improvement, Spinal fluid testing January 29, 2022 showed normal total protein of 45, elevated IgG index            She is still at the peak of the benefit from her first round of IVIG treatment, she has moderate distal weakness, gait abnormality, sensory loss, need further treatment, start outpatient IVIG preauthorization"  PRECAUTIONS: Fall and Other: impaired sensation to light touch  WEIGHT BEARING RESTRICTIONS: No  PAIN:  Are you having pain? Yes, 7/10 Rt wrist/forearm - "pulling" feeling. Bending wrist makes worse. Pt reports this began last Friday out of nowhere - denies injuries and falls  FALLS: Has patient fallen in last 6 months? Yes. Number of falls 7 the week after surgery  LIVING ENVIRONMENT: Lives with:  mother Lives in: House/apartment; walk-in Stairs: Yes: External: 6 steps; bilateral but cannot reach both Has following  equipment at home: Gilford Rile - 2 wheeled, [place], Wheelchair (manual), shower chair, and bed side commode  Was living with sister Lives in: 1st level apartment; tub shower Stairs: Yes: External: 4 steps; bilateral can reach both OR 1 step in back without rails  PLOF: Independent; driving; was working before surgery  PATIENT GOALS: Wants to get back to lifting weights  OBJECTIVE:   HAND DOMINANCE: Right  ADLs: Overall ADLs: mod I with increased time  IADLs: Light housekeeping: max A Meal Prep: warms some items up but is not  cooking Medication management: mod I Handwriting: 25% legible  MOBILITY STATUS: Independent   ACTIVITY TOLERANCE: Activity tolerance: fair  UPPER EXTREMITY ROM:     AROM Right (eval) Left (eval)  Shoulder flexion WNL WNL  Shoulder abduction WNL WNL  Elbow flexion WNL WNL  Elbow extension WNL WNL  Wrist flexion WNL WNL  Wrist extension WNL WNL  Wrist pronation WNL WNL  Wrist supination WNL WNL   Digit Composite Flexion WNL WNL  Digit Composite Extension WNL WNL  Digit Opposition WNL WNL  (Blank rows = not tested)   UPPER EXTREMITY MMT:   (fair endurance)  MMT Right (eval) Left (eval)  Shoulder flexion WNL WNL  Shoulder abduction WNL WNL  Elbow flexion WNL WNL  Elbow extension WNL WNL  (Blank rows = not tested)  HAND FUNCTION: Grip strength: Right: 43.6 lbs; Left: 46.7 lbs  COORDINATION: 9 Hole Peg test: Right: 35 sec; Left: 31 sec  SENSATION: Light touch: Impaired  and only able to distinguish light touch with eyes occluded to R 4th and 5th digits as well as L 4th digit palmarly. Pt reports numbness in both hands especially in palms and 4th and 5th digits  EDEMA: none  MUSCLE TONE: RUE: Within functional limits and LUE: Within functional limits  COGNITION: Overall cognitive status: Within functional limits for tasks assessed  VISION: Subjective report: no changes Baseline vision:  wears glasses for driving only  PERCEPTION: WFL  PRAXIS: WFL  OBSERVATIONS: Pt ambulates from lobby to treatment room with increased time and no AD. No LOB. She appears well kept and is accompanied by her mother.   TODAY'S TREATMENT:                                                                                                                               Reviewed theraband HEP -pt demo each x 10 reps w/ cues to keep wrists neutral to prevent pain  UBE x 7 mintues, level 5 for UE strength/endurance   Pt practiced writing name x 6 with 100% legibility.  Pt practiced  placing small pegs in pegboard Rt hand, then Lt hand with no difficulty.    PATIENT EDUCATION: Education details: theraband HEP  Person educated: Patient Education method: Theatre stage manager, demo Education comprehension: verbalized understanding, return demo   [place]: 03/11/22: Putty HEP 03/25/22: Theraband HEP   GOALS:  SHORT TERM GOALS: Target  date: 03/20/2022   Pt will be able to independently recall at least 3 safety precautions secondary to sensory impairments. Baseline: able to recall one independently Goal status: MET  2.  Pt will be independent with sensory HEP for BUE.  Baseline: not yet initiated Goal status: MET   LONG TERM GOALS: Target date: 04/17/2022    Pt will be independent with updated BUE HEP for strengthening and coordination.  Baseline: not yet initiated Goal status: MET  2.  Pt will cook light, stovetop meal prep with SBA demonstrating good safety awareness and use of AD as needed.  Baseline: Pt dependent with stovetop meal prep with poor insight to sensory precautions. Goal status: NOT MET - not yet attempted (pt has not ordered recommended A/E yet, but plans to)   3.  Pt will report handwriting at at least 75% of PLOF with use of R dominant hand and use of AD as needed.  Baseline: handwriting less than 25% of PLOF with use of R dominant hand and no use of AD. Goal status: MET - pt 100% legible and feels like it has returned to normal   ASSESSMENT:  CLINICAL IMPRESSION: Pt progressing clinically. Pt has met all STG's and 2/3 LTG's. Remaining LTG is not met b/c pt has not yet attempted, but will need A/E to be safe d/t continued lack of sensation. Otherwise, pt is doing all activities and BUE strength and coordination intact.   PERFORMANCE DEFICITS: in functional skills including ADLs, IADLs, coordination, sensation, strength, pain, Fine motor control, body mechanics, decreased knowledge of precautions, decreased knowledge of use  of DME, and UE functional use.  IMPAIRMENTS: are limiting patient from ADLs, IADLs, work, leisure, and social participation.   CO-MORBIDITIES: may have co-morbidities  that affects occupational performance. Patient will benefit from skilled OT to address above impairments and improve overall function.  MODIFICATION OR ASSISTANCE TO COMPLETE EVALUATION: No modification of tasks or assist necessary to complete an evaluation.  OT OCCUPATIONAL PROFILE AND HISTORY: Problem focused assessment: Including review of records relating to presenting problem.  CLINICAL DECISION MAKING: LOW - limited treatment options, no task modification necessary  REHAB POTENTIAL: Good  EVALUATION COMPLEXITY: Low    PLAN:  OT FREQUENCY: 1x/week  OT DURATION: 8 weeks  PLANNED INTERVENTIONS: self care/ADL training, therapeutic exercise, therapeutic activity, neuromuscular re-education, functional mobility training, electrical stimulation, ultrasound, fluidotherapy, patient/family education, coping strategies training, DME and/or AE instructions, and Re-evaluation  RECOMMENDED OTHER SERVICES: None at this time  CONSULTED AND AGREED WITH PLAN OF CARE: Patient and family member/caregiver  PLAN  d/c OT  Check all possible CPT codes: 97110- Therapeutic Exercise, [phone]- Neuro Re-education, 97140 - Manual Therapy, 97530 - Therapeutic Activities, 78295 - Self Care, 97014 - Electrical stimulation (unattended), [id number] - Electrical stimulation (Manual), and [id number] - Ultrasound    Check all conditions that are expected to impact treatment: Neurological condition   If treatment provided at initial evaluation, no treatment charged due to lack of authorization.      OCCUPATIONAL THERAPY DISCHARGE SUMMARY  Visits from Start of Care: 5  Current functional level related to goals / functional outcomes: SEE ABOVE   Remaining deficits: Sensation   Education / Equipment: HEP's, safety considerations for lack of  sensation, A/E recommendations   Patient agrees to discharge. Patient goals were met. Patient is being discharged due to being pleased with the current functional level.Hans Eden, OT 04/01/2022, 8:41 AM

## 2022-04-01 NOTE — Therapy (Signed)
OUTPATIENT PHYSICAL THERAPY NEURO TREATMENT/DISCHARGE SUMMARY   Patient Name: Savannah Alvarez MRN: [id number] DOB:[birth year], 22 y.o., female Today's Date: 04/01/2022   PCP: Jeannene Patella, PA  REFERRING PROVIDER:  [place] Eric J, DO  END OF SESSION:  PT End of Session - 04/01/22 0842     Visit Number 7    Number of Visits 9    Date for PT Re-Evaluation 04/21/22    Authorization Type [place] [employer] - 8 visits approved 02/25/22 - 05/27/22    Authorization - Visit Number 6    Authorization - Number of Visits 8    PT Start Time 2094    PT Stop Time 0918   full time not used due to D/C visit   PT Time Calculation (min) 37 min    Activity Tolerance Patient tolerated treatment well    Behavior During Therapy [facility] for tasks assessed/performed                Past Medical History:  Diagnosis Date   Eczema    Past Surgical History:  Procedure Laterality Date   HIP SURGERY Right    Patient Active Problem List   Diagnosis Date Noted   Protein-calorie malnutrition, severe 01/31/2022   Guillain Barr syndrome ([place]) 01/29/2022   Anemia 01/29/2022   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[place]) 01/28/2022   Gait abnormality 01/28/2022   Angioedema 09/04/2013   Swelling of face 09/04/2013    ONSET DATE: 02/02/2022  REFERRING DIAG: G61.0 (ICD-10-CM) - 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 X-rays looked good. Feels like she has a good exercise program  to keep working on. Has not gotten back to the gym yet.   Pt accompanied by: self   PERTINENT HISTORY: Savannah Alvarez is a 22 y.o. 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   Patient reports on October 30, she woke up with a tingling sensation in bilateral hands and feet which progressively worsened to all extremities.  She reports 7 falls since mid November and difficulty holding things in her hands such as utensils.  She was seen by neurology outpatient, Dr. Krista Blue and had EMG-NCS performed which confirmed acute demyelinating polyradiculoneuropathy, mild axonal component and she was directed to the ED for further evaluation and treatment with IVIG versus Plex and LP. Pt treated with IVIG infusions. Discharged 02/02/22.  PAIN:  Are you having pain? No  PRECAUTIONS: Fall  FALLS: Has patient fallen in last 6 months? Yes. 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 Can  just detect around knees. Proprioception: WFL for ankle PF/DF bilat   Can detect deeper pressure more proximally, unable to detect at ankles   COORDINATION: Heel to shin: slower and weaker to perform with RLE     POSTURE: rounded shoulders and forward head    LOWER EXTREMITY MMT:    MMT Right Eval Left Eval Right 04/01/22  Hip flexion 3- 5 5  Hip extension 3- 4 5  Hip abduction 3- 5 4+  Hip adduction     Hip internal rotation     Hip external rotation     Knee flexion 5 5   Knee extension 5 5   Ankle dorsiflexion 5 5   Ankle plantarflexion     Ankle inversion     Ankle eversion     (Blank rows = not tested)     TODAY'S TREATMENT:         Ther Ex  Elliptical level 1.5 for 3 minutes in fwd direction for improved BLE strength, endurance and cardiovascular warmup. 6 minutes total on Elliptical  Double leg presses, 2 x 10 reps at 120#, for improved BLE strength.  Progressed to 10 reps of single leg presses at 80# for BLE and an additional 10 reps of 85# each leg to target single leg stability and hip position.   In quadruped position: Ambulance person to kicking leg at a diagonal for hip ABD strengthening x10 reps each side, pt  fatigues more with RLE   Sidelying RLE clamshells into internal rotation x10 reps, cued for full ROM into hip ER and IR  With single UE support at countertop x10 reps lunges each leg while holding 10# kettlebell With no UE support x10 reps lunges with contralateral march for dynamic SLS  With air ex on 6" step, lateral step ups without UE support and focus on eccentric control back down to the floor x10 reps each leg   PHYSICAL THERAPY DISCHARGE SUMMARY  Visits from Start of Care: 7  Current functional level related to goals / functional outcomes: See LTGs/Clinical Assessment    Remaining deficits: Mild weakness R hip ABD    Education / Equipment: HEP  Patient agrees to discharge. Patient goals were met. Patient is being discharged due to  meeting the stated rehab goals.   GAIT: Gait pattern: step through pattern, decreased step length- Left, decreased stance time- Right, and antalgic Distance walked: Clinic distances Assistive device utilized: None Level of assistance: Complete Independence   PATIENT EDUCATION: Education details: D/C from PT, progress towards goals   Person educated: Patient Education method: Explanation Education comprehension: verbalized understanding  HOME EXERCISE PROGRAM: Access Code: [email or web address] URL: [email or web address] Date: 02/25/2022 Prepared by: Mickie Bail Plaster  Exercises - Feet Together Balance Eyes Closed on Unstable Surface  - 1 x daily - 7 x weekly - 3-4 reps - 15-30 second hold - Narrow Stance with Eyes Closed and Head Rotation  - 1 x daily - 7 x weekly - 3 sets - 10 reps - Side Stepping with Resistance at Thighs  - 1 x daily - 7 x weekly - 3-4 reps - Forward Backward Monster Walk with Band at [employer] and Counter Support  - 1 x daily - 7 x weekly - 3-4 reps - Modified Single-Leg Deadlift   - 1 x daily - 7 x weekly - 3 sets - 10 reps - Seated Hamstring Stretch  - 1 x daily - 7 x weekly - 3 sets - 10 reps - Hip Flexor Stretch at Edge of Bed  - 1 x daily - 7 x weekly - 3-4 reps - 30-60 second hold  Pt given exercises for hip strengthening from ortho PT the other day, educated to continue to work on those.   GOALS: Goals reviewed with patient? Yes  SHORT TERM GOALS: Target date: 03/20/2022  Pt will be independent with initial HEP in order to build upon functional gains made in therapy. Baseline: Pt reports consistently performing exercises  Goal status: MET  2.  Pt will undergo further testing of mCTSIB with LTG written.  Baseline: Goal not needed.  Goal status: MET  3.  Pt will improve FGA to at least a 24/30 in order to demo decr fall risk. Baseline: 19/30; 29/30 Goal status: MET  4.  Pt will improve gait speed with no AD to at least 2.9 ft/sec in order to demo  improved community mobility.  Baseline: 13.25 seconds = 2.47 ft/sec, 9.3 seconds = 3.52 ft/sec  Goal status: MET    LONG TERM GOALS: Target date: 04/17/2022  Pt will be independent with final HEP in order to build upon functional gains made in therapy. Baseline: pt consistently performing HEP  Goal status: MET  2.  Pt will improve gait speed with no AD to at least 3.5 ft/sec in order to demo improved community mobility.  Baseline: 13.25 seconds = 2.47 ft/sec; 3.52 ft/sec on 03/18/22 Goal status: MET  3.   Pt will improve FGA to at least a 29/30 in order to demo decr fall risk. Baseline: 19/30; 29/30  Goal status: MET  4.  mCTSIB goal to be written as appropriate.  Baseline: Dc'd as pt scored 120/120 on 02/25/22  Goal status: MET  5.  Pt will improve R hip ABD and hip extensor strength to at least a 4/5 in order to demo improved functional strength for daily activities.  Baseline: 3- for both, R hip ABD 4+, R hip ext 5/5 on 04/01/22 Goal status: MET  6.  Pt will verbalize return to the gym and weight lifting (as appropriate) Baseline: pt has not yet return to the gym, but plans to in the future  Goal status: NOT  MET  ASSESSMENT:  CLINICAL IMPRESSION: Pt has met 5 of 6 of her LTGs. Pt has significantly improved R hip flexion, ABD, and extensor strength. Pt did not meet LTG #6, but pt does plan to return to the gym in the future. Pt has consistently been performing her HEP. Remainder of today's session focused on RLE>LLE strengthening. Pt in agreement to D/C due to progress with therapy.    OBJECTIVE IMPAIRMENTS: Abnormal gait, decreased activity tolerance, decreased balance, decreased coordination, decreased knowledge of condition, decreased mobility, difficulty walking, decreased ROM, decreased strength, impaired sensation, and postural dysfunction.   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   REHAB POTENTIAL: Good  CLINICAL DECISION MAKING: Stable/uncomplicated  EVALUATION COMPLEXITY: Low  PLAN:  PT FREQUENCY: 1x/week  PT DURATION: 8 weeks  PLANNED INTERVENTIONS: Therapeutic exercises, Therapeutic activity, Neuromuscular re-education, Balance training, Gait training, Patient/Family education, Self Care, Vestibular training, Manual therapy, and Re-evaluation  PLAN FOR NEXT SESSION: D/C from PT    Drake Leach, PT, DPT 04/01/2022, 9:23 AM   Check all possible CPT codes: 16109 - PT Re-evaluation, 97110- Therapeutic Exercise, [phone]- Neuro Re-education, [phone] - Gait Training, [phone] - Manual Therapy, [phone] - Therapeutic Activities, and [phone] - Self Care    Check all conditions that are expected to impact treatment: Neurological condition   If treatment provided at initial evaluation, no treatment charged due to lack of authorization.

## 2022-04-08 ENCOUNTER — Ambulatory Visit: Payer: Medicaid Other | Admitting: Physical Therapy

## 2022-04-08 ENCOUNTER — Encounter: Payer: Medicaid Other | Admitting: Occupational Therapy

## 2022-04-15 ENCOUNTER — Encounter: Payer: Medicaid Other | Admitting: Occupational Therapy

## 2022-04-15 ENCOUNTER — Ambulatory Visit: Payer: Medicaid Other | Admitting: Physical Therapy

## 2022-04-16 ENCOUNTER — Ambulatory Visit (INDEPENDENT_AMBULATORY_CARE_PROVIDER_SITE_OTHER): Payer: Medicaid Other | Admitting: Neurology

## 2022-04-16 ENCOUNTER — Encounter: Payer: Self-pay | Admitting: Neurology

## 2022-04-16 VITALS — BP 110/72 | HR 80 | Ht 62.0 in | Wt 121.0 lb

## 2022-04-16 DIAGNOSIS — R202 Paresthesia of skin: Secondary | ICD-10-CM | POA: Diagnosis not present

## 2022-04-16 DIAGNOSIS — G610 Guillain-Barre syndrome: Secondary | ICD-10-CM | POA: Diagnosis not present

## 2022-04-16 MED ORDER — DULOXETINE HCL 60 MG PO CPEP: 60.0000 mg | ORAL_CAPSULE | Freq: Every day | ORAL | 3 refills | Status: DC

## 2022-04-16 MED ORDER — DULOXETINE HCL 30 MG PO CPEP: 30.0000 mg | ORAL_CAPSULE | Freq: Every day | ORAL | 0 refills | Status: DC

## 2022-04-16 MED ORDER — GABAPENTIN 300 MG PO CAPS: 600.0000 mg | ORAL_CAPSULE | Freq: Every day | ORAL | 11 refills | Status: DC

## 2022-04-16 NOTE — Progress Notes (Signed)
Chief Complaint  Patient presents with   Follow-up    Rm 14, Reports no changes or improvements with sx        ASSESSMENT AND PLAN  Savannah Alvarez is a 22 y.o. 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  Continue making progress,  Neuropathic pain  Cymbalta 30 mg, titrating to 60 mg daily  Gabapentin 300 mg 1 to 2 tablets at night  DIAGNOSTIC DATA (LABS, IMAGING, TESTING) - I reviewed patient records, labs, notes, testing and imaging myself where available.  Laboratory evaluation January 27, 2022, normal or negative RPR, TSH, ANA, B6, vitamin D, A1c, rheumatoid factor, B1 CPK slightly elevated 188, repeat CMP showed normal liver functional test, calcium was mildly elevated 10.5, ESR was elevated 45, hemoglobin has improved to 11.8  Laboratory evaluation from [facility] February 24, 2022, ferritin 65, iron level 42, hemoglobin 10.2, creatinine 0.6, otherwise normal CMP, MEDICAL HISTORY:  Savannah Alvarez is a 22 years old female, accompanied by her mother, referred by Dr. Rexene Alberts for electrodiagnostic study for subacute onset weakness, numbness.    I reviewed and summarized the referring note. PMHX.  She had right hip surgery periacetabular  osteotomy at [facility] on December 15, 2021, initially recovered well, pain was well controlled  Second week around December 22, 2021 woke up noticed numbness tingling and numbness of shooting pain at bilateral lower extremity from ankle down, bilateral hands from the wrist down, she also noticed clumsiness of right hand, could not hold her crutches well, fell multiple times since then  Was treated at [facility] emergency room on December 28, 2021, also with complaints of shortness of breath, facial weakness, was found to have mild anemia hemoglobin of 10, mild elevation of AST 84 ALT of 112, magnesium was decreased 1.8, vitamin D level was less than 8  She was given magnesium supplement,  CT PE was negative  EKG showed sinus rhythm, heart rate of 96 bpm  Follow-up visit with orthopedic surgeon December 30, 2021 showed postsurgical change of right periacetabular osteotomy, no new fracture, no hardware complications,  Orthopedic follow-up again January 13, 2022, with complaints of worsening upper and lower extremity weakness, numbness, also developed facial weakness, was given the diagnosis of Bell's palsy, need eyedrop to close her eyes, fell several times, no longer needing narcotics for pain control,  Patient reported her worst time was before Thanksgiving, when she has shortness of breath, palpitation, difficulty closing her eyes, profound bilateral upper and lower extremity weakness, numbness, since then, she seems to have made slow progress, still have numbness, but not as dense, not as painful, continue to have weakness, she can close her eyes a little bit better,  Electrodiagnostic study January 28, 2022 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, mainly demyelinating, with mild axonal component  UPDATE Feb 05 2022: She  was admitted to hospital December 6 through 11, 2023 with diagnosis of AIDP, Lumbar puncture January 29, 2022: Normal total protein 45, glucose  60, elevated CSF IgG, IgG/albumin ratio, IgG index, WBC of 2  She was started on IVIG, began to notice improvement 2 days after infusion, continue to progress, she now ambulated with a walker, but still have gait abnormality, planning on family cruise December 17 to 23, 2023 She denied chewing difficulty, no swallowing or breathing difficulties  UPDATE Apr 16 2022: Accompanied by her boyfriend at today's visit, overall doing very well, no gait abnormality, no muscle weakness, still have bilateral fingertips and toes paresthesia, sometimes painful, difficulty sleeping because of that  Following her hospital admission, IVIG loading dose 2 g/kg from December 6 -11  2023, she began home health [facility] infusion, maintenance dose 1 g/kg January 22, 23 2024, will receive again on March 4 and 5  She is overall trending up, slower improvement over the past few weeks,    PHYSICAL EXAM:   Vitals:   04/16/22 1206  BP: 110/72  Pulse: 80  Weight: 121 lb (54.9 kg)  Height: 5' 2"$  (1.575 m)   Body mass index is 22.13 kg/m.  PHYSICAL EXAMNIATION:  Gen: NAD, conversant, well nourised, well groomed                     Cardiovascular: Regular rate rhythm, no peripheral edema, warm, nontender. Eyes: Conjunctivae clear without exudates or hemorrhage Neck: Supple, no carotid bruits. Pulmonary: Clear to auscultation bilaterally, good air movement at the base of bilateral lung  NEUROLOGICAL EXAM:  MENTAL STATUS: Speech/cognition: Tearful at today's clinical visit CRANIAL NERVES: CN II: Visual fields are full to confrontation. Pupils are round equal and briskly reactive to light. CN III, IV, VI: extraocular movement are normal. No ptosis. CN V: Facial sensation is intact to light touch CN VII: Face is symmetric with normal eye closure  CN VIII: Hearing is normal to causal conversation. CN IX, X: Phonation is normal. CN XI: Head turning and shoulder shrug are intact  MOTOR: No proximal and distal  weakness  REFLEXES: Areflexia  SENSORY: Preserved to toe and fingertip vibratory sensation, and proprioception. Mild length-dependent decreased to light touch, pinprick to mid palm, ankle level.     COORDINATION: There is no trunk or limb dysmetria noted.  GAIT/STANCE: Steady, able to stand up on tiptoes and heels, no Romberg sign  REVIEW OF SYSTEMS:  Full 14 system review of systems performed and notable only for as above All other review of systems were negative.   ALLERGIES: Allergies  Allergen Reactions   Other Swelling    Onions, Cantaloupe & tomato    HOME MEDICATIONS: Current Outpatient Medications  Medication Sig Dispense Refill   acetaminophen (TYLENOL) 500 MG tablet Take 500 mg by mouth every 6 (six) hours as needed for moderate pain.     diazepam (VALIUM) 5 MG tablet Take 5 mg by mouth every 8 (eight) hours as needed for anxiety.     famotidine (PEPCID) 20 MG tablet Take 20 mg by mouth daily as needed for heartburn or indigestion.     meloxicam (MOBIC) 7.5 MG tablet Take 7.5 mg by mouth as needed for pain.     omalizumab Arvid Right) 150 MG/ML prefilled syringe Inject 300 mg into the skin every 28 (twenty-eight) days.     ondansetron (ZOFRAN) 4 MG tablet Take 4 mg by mouth every 8 (eight) hours as needed for nausea or vomiting.  sertraline (ZOLOFT) 50 MG tablet Take 50 mg by mouth daily.     No current facility-administered medications for this visit.    PAST MEDICAL HISTORY: Past Medical History:  Diagnosis Date   Eczema     PAST SURGICAL HISTORY: Past Surgical History:  Procedure Laterality Date   HIP SURGERY Right     FAMILY HISTORY: Family History  Problem Relation Age of Onset   Hypertension Father    Diabetes Father    Hyperthyroidism Mother    Diabetes Maternal Grandmother    Hypertension Maternal Grandmother    Diabetes Maternal Grandfather    Hypertension Maternal Grandfather     SOCIAL HISTORY: Social History   Socioeconomic History    Marital status: Single    Spouse name: Not on file   Number of children: 0   Years of education: Not on file   Highest education level: Some college, no degree  Occupational History    Comment: student in college  Tobacco Use   Smoking status: Never    Passive exposure: Never   Smokeless tobacco: Never  Vaping Use   Vaping Use: Never used  Substance and Sexual Activity   Alcohol use: Never   Drug use: Never   Sexual activity: Yes    Birth control/protection: None  Other Topics Concern   Not on file  Social History Narrative   Lives at college   1 can of [place] per day   Social Determinants of Health   Financial Resource Strain: Not on file  Food Insecurity: No Food Insecurity (01/29/2022)   Hunger Vital Sign    Worried About Running Out of Food in the Last Year: Never true    Ran Out of Food in the Last Year: Never true  Transportation Needs: No Transportation Needs (01/29/2022)   PRAPARE - Hydrologist (Medical): No    Lack of Transportation (Non-Medical): No  Physical Activity: Not on file  Stress: Not on file  Social Connections: Not on file  Intimate Partner Violence: Not At Risk (02/01/2022)   Humiliation, Afraid, Rape, and Kick questionnaire    Fear of Current or Ex-Partner: No    Emotionally Abused: No    Physically Abused: No    Sexually Abused: No      Marcial Pacas, M.D. Ph.D.  [facility] [address] [place], [place] [address] Ph: [phone] Fax: [phone]  CC:  Jeannene Patella, PA [address],  [place] [id number]  Jeannene Patella, PA

## 2022-04-16 NOTE — Patient Instructions (Addendum)
Meds ordered this encounter  Medications   DULoxetine (CYMBALTA) 30 MG capsule---1st month    Sig: Take 1 capsule (30 mg total) by mouth daily.    Dispense:  30 capsule    Refill:  0   DULoxetine (CYMBALTA) 60 MG capsule    Sig: Take 1 capsule (60 mg total) by mouth daily.    Dispense:  90 capsule    Refill:  3   gabapentin (NEURONTIN) 300 MG capsule    Sig: Take 2 capsules (600 mg total) by mouth at bedtime.    Dispense:  60 capsule    Refill:  11

## 2022-05-09 ENCOUNTER — Other Ambulatory Visit: Payer: Self-pay | Admitting: Neurology

## 2022-05-13 NOTE — Telephone Encounter (Signed)
I think you sent this back to me and it didn't state if you wanted to go with 30mg  or 60mg ? Or if they were supposed to be on both? Routing back to dr. Krista Blue.

## 2022-05-13 NOTE — Telephone Encounter (Signed)
This patient has 30 and 60mg  listed. Routing to provider to clarify which they prefer to send and to remove if any duplicate

## 2022-05-22 ENCOUNTER — Emergency Department (HOSPITAL_COMMUNITY)
Admission: EM | Admit: 2022-05-22 | Discharge: 2022-05-23 | Disposition: A | Discharge status: Home / Self Care | Payer: Medicaid Other | Attending: Emergency Medicine | Admitting: Emergency Medicine

## 2022-05-22 ENCOUNTER — Other Ambulatory Visit: Payer: Self-pay

## 2022-05-22 ENCOUNTER — Emergency Department (HOSPITAL_COMMUNITY): Payer: Medicaid Other

## 2022-05-22 ENCOUNTER — Encounter (HOSPITAL_COMMUNITY): Payer: Self-pay | Admitting: Emergency Medicine

## 2022-05-22 DIAGNOSIS — R0781 Pleurodynia: Secondary | ICD-10-CM | POA: Diagnosis not present

## 2022-05-22 DIAGNOSIS — R0600 Dyspnea, unspecified: Secondary | ICD-10-CM | POA: Diagnosis not present

## 2022-05-22 DIAGNOSIS — I2510 Atherosclerotic heart disease of native coronary artery without angina pectoris: Secondary | ICD-10-CM | POA: Diagnosis not present

## 2022-05-22 DIAGNOSIS — D649 Anemia, unspecified: Secondary | ICD-10-CM | POA: Insufficient documentation

## 2022-05-22 DIAGNOSIS — R202 Paresthesia of skin: Secondary | ICD-10-CM | POA: Diagnosis present

## 2022-05-22 DIAGNOSIS — R791 Abnormal coagulation profile: Secondary | ICD-10-CM | POA: Insufficient documentation

## 2022-05-22 LAB — BASIC METABOLIC PANEL
Anion gap: 12 (ref 5–15)
BUN: 17 mg/dL (ref 6–20)
CO2: 24 mmol/L (ref 22–32)
Calcium: 9.7 mg/dL (ref 8.9–10.3)
Chloride: 99 mmol/L (ref 98–111)
Creatinine, Ser: 0.73 mg/dL (ref 0.44–1.00)
GFR, Estimated: >60 mL/min (ref >60)
Glucose, Bld: 93 mg/dL (ref 70–99)
Potassium: 3.9 mmol/L (ref 3.5–5.1)
Sodium: 135 mmol/L (ref 135–145)

## 2022-05-22 LAB — CBC
HCT: 33.4 % — ABNORMAL LOW (ref 36.0–46.0)
Hemoglobin: 10.3 g/dL — ABNORMAL LOW (ref 12.0–15.0)
MCH: 25.6 pg — ABNORMAL LOW (ref 26.0–34.0)
MCHC: 30.8 g/dL (ref 30.0–36.0)
MCV: 82.9 fL (ref 80.0–100.0)
Platelets: 552 10*3/uL — ABNORMAL HIGH (ref 150–400)
RBC: 4.03 MIL/uL (ref 3.87–5.11)
RDW: 15.3 % (ref 11.5–15.5)
WBC: 5.4 10*3/uL (ref 4.0–10.5)
nRBC: 0 % (ref 0.0–0.2)

## 2022-05-22 LAB — URINALYSIS, ROUTINE W REFLEX MICROSCOPIC
Bilirubin Urine: NEGATIVE
Glucose, UA: NEGATIVE mg/dL
Hgb urine dipstick: NEGATIVE
Ketones, ur: NEGATIVE mg/dL
Leukocytes,Ua: NEGATIVE
Nitrite: NEGATIVE
Protein, ur: 100 mg/dL — AB
Specific Gravity, Urine: 1.027 (ref 1.005–1.030)
pH: 6 (ref 5.0–8.0)

## 2022-05-22 LAB — I-STAT BETA HCG BLOOD, ED (MC, WL, AP ONLY): I-stat hCG, quantitative: <5 m[IU]/mL (ref <5)

## 2022-05-22 LAB — VITAMIN B12: Vitamin B-12: 500 pg/mL (ref 180–914)

## 2022-05-22 LAB — TROPONIN I (HIGH SENSITIVITY): Troponin I (High Sensitivity): <2 ng/L (ref <18)

## 2022-05-22 LAB — TSH: TSH: 1.072 u[IU]/mL (ref 0.350–4.500)

## 2022-05-22 LAB — FOLATE: Folate: 12.7 ng/mL (ref >5.9)

## 2022-05-22 LAB — T4, FREE: Free T4: 0.92 ng/dL (ref 0.61–1.12)

## 2022-05-22 LAB — D-DIMER, QUANTITATIVE: D-Dimer, Quant: 0.51 ug/mL-FEU — ABNORMAL HIGH (ref 0.00–0.50)

## 2022-05-22 MED ORDER — PYRIDOXINE HCL 100 MG/ML IJ SOLN
100.0000 mg | Freq: Once | INTRAMUSCULAR | Status: DC
  Filled 2022-05-22: qty 1

## 2022-05-22 NOTE — ED Triage Notes (Signed)
Pt dx with Guilland burre December 5th 2023 and was treated for it. Finished at end of February outpatient at [facility]. Reports SOB and feeling like someone sitting on her chest. That feeling has been there since her dx but not resolving.  After last tx saw neurologist in February. She says getting better but slowly. Pt states it is worsening which brought her in over past few weeks.

## 2022-05-22 NOTE — ED Provider Triage Note (Signed)
Emergency Medicine Provider Triage Evaluation Note  Savannah Alvarez , a 22 y.o. female  was evaluated in triage.  Pt complains of tingling and numbness in bilateral upper extremities and lower extremities and pressure on her chest with shortness of breath.  History of Guillain-Barr syndrome, she has undergone treatment for IVIG she last treatment in February through [facility]. Symptoms worsening over the past few weeks.  Case discussed with Dr. Lorrin Goodell who recommends several specific of B vitamins which I have ordered.  He also recommends evaluation for potential pulmonary embolus or clot due to use of IVIG.  Review of Systems  Positive: sob Negative: weakness  Physical Exam  BP 122/80 (BP Location: Right Arm)   Pulse 89   Temp 98.6 F (37 C) (Oral)   Resp 18   SpO2 100%  Gen:   Awake, no distress   Resp:  Normal effort  MSK:   Moves extremities without difficulty  Other:    Medical Decision Making  Medically screening exam initiated at 9:58 PM.  Appropriate orders placed.  Jayva Szymkowiak was informed that the remainder of the evaluation will be completed by another provider, this initial triage assessment does not replace that evaluation, and the importance of remaining in the ED until their evaluation is complete.     Margarita Mail, PA-C 05/22/22 2213

## 2022-05-23 ENCOUNTER — Emergency Department (HOSPITAL_COMMUNITY): Payer: Medicaid Other

## 2022-05-23 MED ORDER — IOHEXOL 350 MG/ML SOLN
75.0000 mL | Freq: Once | INTRAVENOUS | Status: AC | PRN
  Administered 2022-05-23: 75 mL via INTRAVENOUS

## 2022-05-23 NOTE — Progress Notes (Signed)
Pt's Negative Inspiratory Force is >-40cm H2O with great effort. No signs of respiratory distress at this time.

## 2022-05-23 NOTE — ED Provider Notes (Signed)
[facility] Provider Note   CSN: [id number] Arrival date & time: 05/22/22  2104     History  Chief Complaint  Patient presents with   Numbness    Savannah Alvarez is a 22 y.o. female.  The history is provided by the patient.  She has history of Guillain-Barr syndrome and has been having midsternal chest heaviness for several months, getting worse.  Heavy feeling is worse when she takes a deep breath.  She endorses mild dyspnea, did vomit once today, denies diaphoresis.  She denies fever or chills or cough.  She has had persistent numbness in her hands and feet since her diagnosis of Guillain-Barr syndrome 5 months ago.   Home Medications Prior to Admission medications   Medication Sig Start Date End Date Taking? Authorizing Provider  acetaminophen (TYLENOL) 500 MG tablet Take 500 mg by mouth every 6 (six) hours as needed for moderate pain.    [provider]  diazepam (VALIUM) 5 MG tablet Take 5 mg by mouth every 8 (eight) hours as needed for anxiety.    [provider]  DULoxetine (CYMBALTA) 30 MG capsule Take 1 capsule (30 mg total) by mouth daily. 04/16/22   Marcial Pacas, MD  DULoxetine (CYMBALTA) 60 MG capsule Take 1 capsule (60 mg total) by mouth daily. 04/16/22   Marcial Pacas, MD  famotidine (PEPCID) 20 MG tablet Take 20 mg by mouth daily as needed for heartburn or indigestion.    [provider]  gabapentin (NEURONTIN) 300 MG capsule Take 2 capsules (600 mg total) by mouth at bedtime. 04/16/22   Marcial Pacas, MD  meloxicam (MOBIC) 7.5 MG tablet Take 7.5 mg by mouth as needed for pain.    [provider]  omalizumab Arvid Right) 150 MG/ML prefilled syringe Inject 300 mg into the skin every 28 (twenty-eight) days. 05/03/20   [provider]  ondansetron (ZOFRAN) 4 MG tablet Take 4 mg by mouth every 8 (eight) hours as needed for nausea or vomiting.    [provider]  sertraline (ZOLOFT) 50 MG  tablet Take 50 mg by mouth daily.    [provider]      Allergies    Other    Review of Systems   Review of Systems  All other systems reviewed and are negative.   Physical Exam Updated Vital Signs BP 103/67   Pulse 88   Temp 98.6 F (37 C) (Oral)   Resp 18   SpO2 100%  Physical Exam Vitals and nursing note reviewed.   22 year old female, resting comfortably and in no acute distress. Vital signs are normal. Oxygen saturation is 100%, which is normal. Head is normocephalic and atraumatic. PERRLA, EOMI. Oropharynx is clear. Neck is nontender and supple without adenopathy or JVD. Back is nontender and there is no CVA tenderness. Lungs are clear without rales, wheezes, or rhonchi. Chest is nontender. Heart has regular rate and rhythm without murmur. Abdomen is soft, flat, nontender without masses or hepatosplenomegaly and peristalsis is normoactive. Extremities have no cyanosis or edema, full range of motion is present. Skin is warm and dry without rash. Neurologic: Mental status is normal, cranial nerves are intact, strength is 5/5 in all 4 extremities.  Decree sensation is noted distal to the elbows and knees.  Deep tendon reflexes are decreased in the knee and ankle, normal in the biceps and brachial radialis bilaterally.  ED Results / Procedures / Treatments   Labs (all labs ordered are  listed, but only abnormal results are displayed) Labs Reviewed  D-DIMER, QUANTITATIVE - Abnormal; Notable for the following components:      Result Value   D-Dimer, Quant 0.51 (*)    All other components within normal limits  CBC - Abnormal; Notable for the following components:   Hemoglobin 10.3 (*)    HCT 33.4 (*)    MCH 25.6 (*)    Platelets 552 (*)    All other components within normal limits  URINALYSIS, ROUTINE W REFLEX MICROSCOPIC - Abnormal; Notable for the following components:   Protein, ur 100 (*)    Bacteria, UA RARE (*)    All other components within normal  limits  FOLATE  VITAMIN B12  TSH  T4, FREE  BASIC METABOLIC PANEL  VITAMIN B1  VITAMIN B6  I-STAT BETA HCG BLOOD, ED ([facility], [facility], [facility] ONLY)  TROPONIN I (HIGH SENSITIVITY)  TROPONIN I (HIGH SENSITIVITY)    EKG EKG Interpretation  Date/Time:  Friday May 22 2022 20:54:20 EDT Ventricular Rate:  89 PR Interval:  124 QRS Duration: 88 QT Interval:  340 QTC Calculation: 413 R Axis:   77 Text Interpretation: Normal sinus rhythm Nonspecific T wave abnormality Abnormal ECG When compared with ECG of 17-Feb-2022 00:50, No significant change was found Confirmed by Delora Fuel ([id number]) on 05/22/2022 11:29:01 PM   EKG Interpretation  Date/Time:  Saturday May 23 2022 02:14:58 EDT Ventricular Rate:  80 PR Interval:  156 QRS Duration: 76 QT Interval:  368 QTC Calculation: 425 R Axis:   80 Text Interpretation: Sinus rhythm RSR' in V1 or V2, probably normal variant Borderline T abnormalities, anterior leads When compared with ECG of 05/22/2022, No significant change was found Confirmed by Delora Fuel [id number]) on 05/23/2022 2:19:10 AM        Radiology DG Chest Port 1 View  Result Date: 05/23/2022 CLINICAL DATA:  Shortness of breath. EXAM: PORTABLE CHEST 1 VIEW COMPARISON:  Chest radiograph dated 02/16/2022. FINDINGS: The heart size and mediastinal contours are within normal limits. Both lungs are clear. The visualized skeletal structures are unremarkable. IMPRESSION: No active disease. Electronically Signed   By: Anner Crete M.D.   On: 05/23/2022 00:36    Procedures Procedures  Cardiac monitor shows normal sinus rhythm, per my interpretation.  Medications Ordered in ED Medications - No data to display  ED Course/ Medical Decision Making/ A&P                             Medical Decision Making  Chest pain in patient with CAD and her a syndrome.  She had been treated with intravenous immunoglobulin which does increase her risk for pulmonary embolism.  Doubt ACS.  Guillain-Barr  symptoms do not seem to be involving the chest.  I have reviewed and interpreted her electrocardiogram, and my interpretation is nonspecific T wave changes unchanged from prior.  ECG was repeated and I have interpreted that ECG as unchanged.  Chest x-ray shows no active cardiopulmonary disease.  I have independently viewed the images, and agree with radiologist's interpretation.  I have reviewed and interpreted her laboratory tests, and my interpretation is normal troponin, normal basic metabolic panel, normal folic acid and 123456 level, normal TSH and free T4, mildly elevated D-dimer concerning for possible pulmonary embolism, stable anemia, thrombocytosis which has increased since 02/16/2022 but is not at a level which would put her at increased risk for thrombosis.  Because of elevated D-dimer, I have ordered  CT angiogram of the chest.  Prior consultation with neurology suggested CT of the head and CT venogram of the head although neurologic symptoms seem to be stable.  I have reviewed her past records and note that she was admitted from 01/28/2022 through 02/02/2022 for Guillain-Barr syndrome treated with intravenous immunoglobulin.  Per office visit on 04/16/2022, she also received intravenous immunoglobulin at home on 1/22 and 1/23 and was scheduled to receive home intravenous immunoglobulin on March 4 and March 5.  CT scans are still pending.  Folate level was normal.  Case is signed out to Dr. Doren Custard.  Final Clinical Impression(s) / ED Diagnoses Final diagnoses:  Paresthesias  Pleuritic chest pain  Normochromic normocytic anemia    Rx / DC Orders ED Discharge Orders     None         Delora Fuel, MD [id number] [phone]

## 2022-05-23 NOTE — ED Notes (Signed)
Pt ambulated to the bathroom. No signs of weakness noted

## 2022-05-23 NOTE — Discharge Instructions (Signed)
Keep your neurology appointment on Wednesday and discuss with them the return of your symptoms.  Return to the emergency department for any new or worsening symptoms of concern.

## 2022-05-23 NOTE — ED Provider Notes (Signed)
Care of patient assumed from Dr. Roxanne Mins.  This patient with recent history of Guillain-Barr syndrome, presents for stocking-glove paresthesias.  Case was initially discussed with neurology.  She is currently awaiting CTA chest, CT head, and CT venogram.  Following scans, follow-up with neurology recommendations. Physical Exam  BP (!) 105/55   Pulse 81   Temp 97.9 F (36.6 C) (Oral)   Resp 15   SpO2 100%   Physical Exam Vitals and nursing note reviewed.  Constitutional:      General: She is not in acute distress.    Appearance: Normal appearance. She is well-developed. She is not ill-appearing, toxic-appearing or diaphoretic.  HENT:     Head: Normocephalic and atraumatic.     Right Ear: External ear normal.     Left Ear: External ear normal.     Nose: Nose normal.     Mouth/Throat:     Mouth: Mucous membranes are moist.  Eyes:     Extraocular Movements: Extraocular movements intact.     Conjunctiva/sclera: Conjunctivae normal.  Cardiovascular:     Rate and Rhythm: Normal rate and regular rhythm.  Pulmonary:     Effort: Pulmonary effort is normal. No respiratory distress.  Abdominal:     General: There is no distension.     Palpations: Abdomen is soft.  Musculoskeletal:        General: No swelling. Normal range of motion.     Cervical back: Normal range of motion and neck supple.     Right lower leg: No edema.     Left lower leg: No edema.  Skin:    General: Skin is warm and dry.     Capillary Refill: Capillary refill takes less than 2 seconds.     Coloration: Skin is not jaundiced or pale.  Neurological:     General: No focal deficit present.     Mental Status: She is alert and oriented to person, place, and time.  Psychiatric:        Mood and Affect: Mood normal.        Behavior: Behavior normal.     Procedures  Procedures  ED Course / MDM    Medical Decision Making  On assessment, patient resting comfortably.  She does endorse ongoing paresthesias to distal  extremities.  She states that the symptoms are typical for her since her Guillain-Barr diagnosis.  She reports that symptoms are typically improved after IVIG and then return after approximately 3 weeks.  Patient completed 6 of 6 IVIG infusions.  The last 1 was 3 weeks ago.  CT head and CT venogram did not show acute findings.  CTA of chest showed no evidence of PE.  There was areas of bronchial wall thickening.  Patient denies any recent cough.  I spoke with neurologist on-call who recommends outpatient management.  Patient currently has an appointment scheduled with her neurologist on Wednesday.  She was advised to discuss her recurrence of symptoms with her neurologist for possible further treatment.  She was discharged in stable condition.       Godfrey Pick, MD 05/23/22 1116

## 2022-05-23 NOTE — ED Notes (Signed)
Called CT regarding ordered CT scan

## 2022-05-23 NOTE — ED Notes (Signed)
Called about pending CT order. No ETA provided.

## 2022-05-23 NOTE — ED Provider Notes (Addendum)
  Discussed results thus far with neurology given her complex history-- CT head, CT venogram, and CTA chest recommended.  These have been ordered.  Patient brought back to triage for PIV placement. Patient and mother updated on care plan.   Larene Pickett, PA-C 05/23/22 0059    Larene Pickett, PA-C [id number] [phone]    Delora Fuel, MD [id number] [phone]

## 2022-05-24 ENCOUNTER — Encounter: Payer: Self-pay | Admitting: Neurology

## 2022-05-25 ENCOUNTER — Inpatient Hospital Stay: Admission: RE | Admit: 2022-05-25 | Payer: Medicaid Other | Source: Ambulatory Visit

## 2022-05-26 LAB — VITAMIN B6: Vitamin B6: 8.2 ug/L (ref 3.4–65.2)

## 2022-05-27 ENCOUNTER — Ambulatory Visit: Payer: Medicaid Other | Admitting: Neurology

## 2022-05-27 ENCOUNTER — Encounter: Payer: Self-pay | Admitting: Neurology

## 2022-05-27 VITALS — BP 109/65 | HR 81 | Ht 62.0 in | Wt 136.0 lb

## 2022-05-27 DIAGNOSIS — R202 Paresthesia of skin: Secondary | ICD-10-CM

## 2022-05-27 DIAGNOSIS — G610 Guillain-Barre syndrome: Secondary | ICD-10-CM | POA: Diagnosis not present

## 2022-05-27 MED ORDER — MELOXICAM 7.5 MG PO TABS: 7.5000 mg | ORAL_TABLET | Freq: Every day | ORAL | 3 refills | Status: DC | PRN

## 2022-05-27 NOTE — Progress Notes (Signed)
Chief Complaint  Patient presents with   Follow-up    Rm 17, with mother  C/o chest pain  Numbness has not improved, states gabapentin and Cymbalta has not work well        ASSESSMENT AND PLAN  Savannah Alvarez is a 22 y.o. 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  Continue making progress,  Continued neuropathic pain, to the point of difficulty sleeping, in the setting of suboptimal control of anxiety  Cymbalta 60 mg daily  Also on trazodone 100 mg from primary care physician,  May consider higher dose of gabapentin up to 1200 mg every night as needed  Mobic as needed for her complaints of frequent chest pain,   Repeat EMG nerve conduction study  DIAGNOSTIC DATA (LABS, IMAGING, TESTING) - I reviewed patient records, labs, notes, testing and imaging myself where available.  Laboratory evaluation January 27, 2022, normal or negative RPR, TSH, ANA, B6, vitamin D, A1c, rheumatoid factor, B1 CPK slightly elevated 188, repeat CMP showed normal liver functional test, calcium was mildly elevated 10.5, ESR was elevated 45, hemoglobin has improved to 11.8  Laboratory evaluation from [facility] February 24, 2022, ferritin 65, iron level 42, hemoglobin  10.2, creatinine 0.6, otherwise normal CMP, MEDICAL HISTORY:  Savannah Alvarez is a 22 years old female, accompanied by her mother, referred by Dr. Rexene Alberts for electrodiagnostic study for subacute onset weakness, numbness.    I reviewed and summarized the referring note. PMHX.  She had right hip surgery periacetabular osteotomy at [facility] on December 15, 2021, initially recovered well, pain was well controlled  Second week around December 22, 2021 woke up noticed numbness tingling and numbness of shooting pain at bilateral lower extremity from ankle down, bilateral hands from the wrist down, she also noticed clumsiness of right hand, could not hold her crutches well, fell multiple times since then  Was treated at [facility] emergency room on December 28, 2021, also with complaints of shortness of breath, facial weakness, was found to have mild anemia hemoglobin of 10, mild elevation of AST 84 ALT of 112, magnesium was decreased 1.8, vitamin D level was less than 8  She was given magnesium supplement,  CT PE was negative  EKG showed sinus rhythm, heart rate of 96 bpm  Follow-up visit with orthopedic surgeon December 30, 2021 showed postsurgical change of right periacetabular osteotomy, no new fracture, no hardware complications,  Orthopedic follow-up again January 13, 2022, with complaints of worsening upper and lower extremity weakness, numbness, also developed facial weakness, was given the diagnosis of Bell's palsy, need eyedrop to close her eyes, fell several times, no longer needing narcotics for pain control,  Patient reported her worst time was before Thanksgiving, when she has shortness of breath, palpitation, difficulty closing her eyes, profound bilateral upper and lower extremity weakness, numbness, since  then, she seems to have made slow progress, still have numbness, but not as dense, not as painful, continue to have weakness, she can close her eyes a little bit  better,  Electrodiagnostic study January 28, 2022 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, mainly demyelinating, with mild axonal component  UPDATE Feb 05 2022: She was admitted to hospital December 6 through 11, 2023 with diagnosis of AIDP, Lumbar puncture January 29, 2022: Normal total protein 45, glucose 60, elevated CSF IgG, IgG/albumin ratio, IgG index, WBC of 2  She was started on IVIG, began to notice improvement 2 days after infusion, continue to progress, she now ambulated with a walker, but still have gait abnormality, planning on family cruise December 17 to 23, 2023 She denied chewing difficulty, no swallowing or breathing difficulties  UPDATE Apr 16 2022: Accompanied by her boyfriend at today's visit, overall doing very well, no gait abnormality, no muscle weakness, still have bilateral fingertips and toes paresthesia, sometimes painful, difficulty sleeping because of that  Following her hospital admission, IVIG loading dose 2 g/kg from December 6 -11  2023, she began home health [facility] infusion, maintenance dose 1 g/kg January 22, 23 2024, will receive again on March 4 and 5  She is overall trending up, slower improvement over the past few weeks,  Update May 27, 2022, She is with her mother at today's clinical visit, continue complains of bilateral fingertips and toes paresthesia, no longer have gait abnormality, weakness,  She reported long history of anxiety, getting much worse since her surgery in October 2023, she lives at home, but no longer goes to work or attending school, before this, she was in houses few times each week,  She complains of difficulty sleeping despite polypharmacy including trazodone 100 mg every night, gabapentin up to 600 mg daily, and Cymbalta 60 mg every morning Also complains of frequent chest pain, shortness of breath with exertion, presented to emergency room had a CT angiogram of chest on May 23, 2022 that was  normal  CT head without contrast, CT venogram of head was normal PHYSICAL EXAM:   Vitals:   05/27/22 1555  BP: 109/65  Pulse: 81  Weight: 136 lb (61.7 kg)  Height: 5\' 2"  (1.575 m)   Body mass index is 24.87 kg/m.  PHYSICAL EXAMNIATION:  Gen: NAD, conversant, well nourised, well groomed                     Cardiovascular: Regular rate rhythm, no peripheral edema, warm, nontender. Eyes: Conjunctivae clear without exudates or hemorrhage Neck: Supple, no carotid bruits. Pulmonary: Clear to auscultation bilaterally, good air movement at the base of bilateral lung  NEUROLOGICAL EXAM:  MENTAL STATUS: Speech/cognition: Tearful at today's clinical visit CRANIAL NERVES: CN II: Visual fields are full to confrontation. Pupils are round equal and briskly reactive to light. CN III, IV, VI: extraocular movement are normal. No ptosis. CN V: Facial sensation is intact to light touch CN VII: Face is symmetric with normal eye closure  CN VIII: Hearing is normal to causal conversation. CN IX, X: Phonation is normal. CN XI: Head turning and shoulder shrug are intact  MOTOR: No proximal and distal weakness  REFLEXES: Trace upper extremity reflex, brisk bilateral patellar reflex, absent ankle reflex,  SENSORY: Preserved to toe and fingertip vibratory sensation, and proprioception. Mild length-dependent decreased to light touch, pinprick to ankle level  COORDINATION: There is no trunk or limb dysmetria noted.  GAIT/STANCE: Steady, able to stand up on  tiptoes and heels, no Romberg sign  REVIEW OF SYSTEMS:  Full 14 system review of systems performed and notable only for as above All other review of systems were negative.   ALLERGIES: Allergies  Allergen Reactions   Other Swelling    Onions, Cantaloupe & tomato    HOME MEDICATIONS: Current Outpatient Medications  Medication Sig Dispense Refill   acetaminophen (TYLENOL) 500 MG tablet Take 500 mg by mouth every 6 (six) hours as  needed for moderate pain.     diazepam (VALIUM) 5 MG tablet Take 5 mg by mouth every 8 (eight) hours as needed for anxiety.     DULoxetine (CYMBALTA) 30 MG capsule Take 1 capsule (30 mg total) by mouth daily. 30 capsule 0   DULoxetine (CYMBALTA) 60 MG capsule Take 1 capsule (60 mg total) by mouth daily. 90 capsule 3   famotidine (PEPCID) 20 MG tablet Take 20 mg by mouth daily as needed for heartburn or indigestion.     gabapentin (NEURONTIN) 300 MG capsule Take 2 capsules (600 mg total) by mouth at bedtime. 60 capsule 11   meloxicam (MOBIC) 7.5 MG tablet Take 7.5 mg by mouth as needed for pain.     omalizumab Arvid Right) 150 MG/ML prefilled syringe Inject 300 mg into the skin every 28 (twenty-eight) days.     ondansetron (ZOFRAN) 4 MG tablet Take 4 mg by mouth every 8 (eight) hours as needed for nausea or vomiting.     sertraline (ZOLOFT) 50 MG tablet Take 50 mg by mouth daily.     No current facility-administered medications for this visit.    PAST MEDICAL HISTORY: Past Medical History:  Diagnosis Date   Eczema     PAST SURGICAL HISTORY: Past Surgical History:  Procedure Laterality Date   HIP SURGERY Right     FAMILY HISTORY: Family History  Problem Relation Age of Onset   Hypertension Father    Diabetes Father    Hyperthyroidism Mother    Diabetes Maternal Grandmother    Hypertension Maternal Grandmother    Diabetes Maternal Grandfather    Hypertension Maternal Grandfather     SOCIAL HISTORY: Social History   Socioeconomic History   Marital status: Single    Spouse name: Not on file   Number of children: 0   Years of education: Not on file   Highest education level: Some college, no degree  Occupational History    Comment: student in college  Tobacco Use   Smoking status: Never    Passive exposure: Never   Smokeless tobacco: Never  Vaping Use   Vaping Use: Never used  Substance and Sexual Activity   Alcohol use: Never   Drug use: Never   Sexual activity: Yes     Birth control/protection: None  Other Topics Concern   Not on file  Social History Narrative   Lives at college   1 can of [place] per day   Social Determinants of Health   Financial Resource Strain: Not on file  Food Insecurity: No Food Insecurity (01/29/2022)   Hunger Vital Sign    Worried About Running Out of Food in the Last Year: Never true    Ran Out of Food in the Last Year: Never true  Transportation Needs: No Transportation Needs (01/29/2022)   PRAPARE - Hydrologist (Medical): No    Lack of Transportation (Non-Medical): No  Physical Activity: Not on file  Stress: Not on file  Social Connections: Not on file  Intimate Partner  Violence: Not At Risk (02/01/2022)   Humiliation, Afraid, Rape, and Kick questionnaire    Fear of Current or Ex-Partner: No    Emotionally Abused: No    Physically Abused: No    Sexually Abused: No      Marcial Pacas, M.D. Ph.D.  [facility] [address] [place], [place] [address] Ph: [phone] Fax: [phone]  CC:  Jeannene Patella, PA [address] [place],  [place] [id number]  Pcp, No

## 2022-06-01 ENCOUNTER — Telehealth: Payer: Self-pay | Admitting: Neurology

## 2022-06-01 ENCOUNTER — Ambulatory Visit (INDEPENDENT_AMBULATORY_CARE_PROVIDER_SITE_OTHER): Payer: Medicaid Other | Admitting: Neurology

## 2022-06-01 DIAGNOSIS — R202 Paresthesia of skin: Secondary | ICD-10-CM

## 2022-06-01 DIAGNOSIS — G610 Guillain-Barre syndrome: Secondary | ICD-10-CM

## 2022-06-01 NOTE — Progress Notes (Signed)
Chief Complaint  Patient presents with   Procedure    Rm EMG/NCV 4.      ASSESSMENT AND PLAN  Savannah Alvarez is a 22 y.o. female Following her right periacetabular osteotomy on December 15, 2021 Developed symptoms of sensory loss, gait abnormality since December 22, 2021, reached its nadir about 4 weeks after onset,  Consistent with diagnosis of acute demyelinating polyradiculoneuropathy, monophase, but her symptoms persisted more than 8 weeks,  At her worst, she has shortness of breath, bilateral facial weakness, palpitation,    She received inpatient IVIG 2 g/kg at hospital from January 28, 2010 2023, reported significant improvement, Spinal fluid testing January 29, 2022 showed normal total protein of 45, elevated IgG index  Began [facility] home ivig infusion Jan 22, 23, last infusion March 4th 5th.  Repeat EMG nerve conduction study June 01, 2022 continue show demyelinating features, patient continues to have neuropathic pain, we will continue IVIG 1 g/kg every 3 weeks for 3 more sessions  Continued neuropathic pain, to the point of difficulty sleeping, in the setting of suboptimal control of anxiety  Cymbalta 60 mg daily  Keep trazodone 100 mg from primary care physician,  May consider higher dose of gabapentin up to 1200 mg every night as needed  Mobic as needed for her complaints of frequent chest pain,   Melatonin for sleep  DIAGNOSTIC DATA (LABS, IMAGING, TESTING) - I reviewed patient records, labs, notes, testing and imaging myself where available.  Laboratory evaluation January 27, 2022, normal or negative RPR, TSH, ANA, B6, vitamin D, A1c, rheumatoid factor, B1 CPK slightly elevated 188, repeat CMP showed normal liver functional test, calcium was mildly elevated 10.5, ESR was elevated 45, hemoglobin has improved to 11.8  Laboratory evaluation from [facility] February 24, 2022, ferritin 65, iron level 42, hemoglobin 10.2, creatinine 0.6, otherwise normal CMP, MEDICAL  HISTORY:  Savannah SnipeKayla Stecher is a 22 years old female, accompanied by her mother, referred by Dr. Frances FurbishAthar for electrodiagnostic study for subacute onset weakness, numbness.    I reviewed and summarized the referring note. PMHX.  She had right hip surgery periacetabular [facility] on December 15, 2021, initially recovered well, pain was well controlled  Second week around December 22, 2021 woke up noticed numbness tingling and numbness of shooting pain at bilateral lower extremity from ankle down, bilateral hands from the wrist down, she also noticed clumsiness of right hand, could not hold her crutches well, fell multiple times since then  Was [facility] room on December 28, 2021, also with complaints of shortness of breath, facial weakness, was found to have mild anemia hemoglobin of 10, mild elevation of AST 84 ALT of 112, magnesium was decreased 1.8, vitamin D level was less than 8  She was given magnesium supplement,  CT PE was negative  EKG showed sinus rhythm, heart rate of 96 bpm  Follow-up visit with orthopedic surgeon December 30, 2021 showed postsurgical change of right periacetabular osteotomy, no new fracture, no hardware complications,  Orthopedic follow-up again January 13, 2022, with complaints of worsening upper and lower extremity weakness, numbness, also developed facial weakness, was given the diagnosis of Bell's palsy, need eyedrop to close her eyes, fell several times, no longer needing narcotics for pain control,  Patient reported her worst time was before Thanksgiving, when she has shortness of breath, palpitation, difficulty closing her eyes, profound bilateral upper and lower extremity weakness, numbness, since then, she seems to have made slow progress, still have numbness,  but not as dense, not as painful, continue to have weakness, she can close her eyes a little bit better,  Electrodiagnostic study January 28, 2022 confirm her diagnosis  of acute demyelinating polyradiculoneuropathy, mainly demyelinating, with mild axonal component  UPDATE Feb 05 2022: She was admitted to hospital December 6 through 11, 2023 with diagnosis of AIDP, Lumbar puncture January 29, 2022: Normal total protein 45, glucose 60, elevated CSF IgG, IgG/albumin ratio, IgG index, WBC of 2  She was started on IVIG, began to notice improvement 2 days after infusion, continue to progress, she now ambulated with a walker, but still have gait abnormality, planning on family cruise December 17 to 23, 2023 She denied chewing difficulty, no swallowing or breathing difficulties  UPDATE Apr 16 2022: Accompanied by her boyfriend at today's visit, overall doing very well, no gait abnormality, no muscle weakness, still have bilateral fingertips and toes paresthesia, sometimes painful, difficulty sleeping because of that  Following her hospital admission, IVIG loading dose 2 g/kg from December 6 -11  2023, she began home health [facility] infusion, maintenance dose 1 g/kg January 22, 23 2024, will receive again on March 4 and 5  She is overall trending up, slower improvement over the past few weeks,  Update May 27, 2022, She is with her mother at today's clinical visit, continue complains of bilateral fingertips and toes paresthesia, no longer have gait abnormality, weakness,  She reported long history of anxiety, getting much worse since her surgery in October 2023, she lives at home, but no longer goes to work or attending school, before this, she was in houses few times each week,  She complains of difficulty sleeping despite polypharmacy including trazodone 100 mg every night, gabapentin up to 600 mg daily, and Cymbalta 60 mg every morning Also complains of frequent chest pain, shortness of breath with exertion, presented to emergency room had a CT angiogram of chest on May 23, 2022 that was normal  CT head without contrast, CT venogram of head was normal  UPDATE  June 01 2022: She return for repeat electrodiagnostic study, which showed dramatic improvement, but continued evidence of some demyelinating features, as evident by prolonged peak latency at sensory nerve study, prolonged distal latency at motor conduction study.   PHYSICAL EXAM:   Vitals:   06/01/22 1317  BP: 115/68  Pulse: 89  Weight: 136 lb (61.7 kg)  Height: 5\' 2"  (1.575 m)   Body mass index is 24.87 kg/m.  PHYSICAL EXAMNIATION:  Gen: NAD, conversant, well nourised, well groomed                     Cardiovascular: Regular rate rhythm, no peripheral edema, warm, nontender. Eyes: Conjunctivae clear without exudates or hemorrhage Neck: Supple, no carotid bruits. Pulmonary: Clear to auscultation bilaterally, good air movement at the base of bilateral lung  NEUROLOGICAL EXAM:  MENTAL STATUS: Speech/cognition: Awake, alert oriented to history taking and care conversation CRANIAL NERVES: CN II: Visual fields are full to confrontation. Pupils are round equal and briskly reactive to light. CN III, IV, VI: extraocular movement are normal. No ptosis. CN V: Facial sensation is intact to light touch CN VII: Face is symmetric with normal eye closure  CN VIII: Hearing is normal to causal conversation. CN IX, X: Phonation is normal. CN XI: Head turning and shoulder shrug are intact  MOTOR: No proximal and distal weakness  REFLEXES: Trace upper extremity reflex, brisk bilateral patellar reflex, absent ankle reflex,  SENSORY: Preserved to toe  and fingertip vibratory sensation, and proprioception. Mild length-dependent decreased to light touch, pinprick to ankle level  COORDINATION: There is no trunk or limb dysmetria noted.  GAIT/STANCE: Steady, able to stand up on tiptoes and heels, no Romberg sign  REVIEW OF SYSTEMS:  Full 14 system review of systems performed and notable only for as above All other review of systems were negative.   ALLERGIES: Allergies  Allergen  Reactions   Other Swelling    Onions, Cantaloupe & tomato    HOME MEDICATIONS: Current Outpatient Medications  Medication Sig Dispense Refill   acetaminophen (TYLENOL) 500 MG tablet Take 500 mg by mouth every 6 (six) hours as needed for moderate pain.     diazepam (VALIUM) 5 MG tablet Take 5 mg by mouth every 8 (eight) hours as needed for anxiety.     DULoxetine (CYMBALTA) 60 MG capsule Take 1 capsule (60 mg total) by mouth daily. 90 capsule 3   famotidine (PEPCID) 20 MG tablet Take 20 mg by mouth daily as needed for heartburn or indigestion.     gabapentin (NEURONTIN) 300 MG capsule Take 2 capsules (600 mg total) by mouth at bedtime. 60 capsule 11   meloxicam (MOBIC) 7.5 MG tablet Take 7.5 mg by mouth as needed for pain.     meloxicam (MOBIC) 7.5 MG tablet Take 1 tablet (7.5 mg total) by mouth daily as needed for pain. After meal 30 tablet 3   omalizumab (XOLAIR) 150 MG/ML prefilled syringe Inject 300 mg into the skin every 28 (twenty-eight) days.     ondansetron (ZOFRAN) 4 MG tablet Take 4 mg by mouth every 8 (eight) hours as needed for nausea or vomiting.     traZODone (DESYREL) 100 MG tablet Take 100 mg by mouth at bedtime.     No current facility-administered medications for this visit.    PAST MEDICAL HISTORY: Past Medical History:  Diagnosis Date   Eczema     PAST SURGICAL HISTORY: Past Surgical History:  Procedure Laterality Date   HIP SURGERY Right     FAMILY HISTORY: Family History  Problem Relation Age of Onset   Hypertension Father    Diabetes Father    Hyperthyroidism Mother    Diabetes Maternal Grandmother    Hypertension Maternal Grandmother    Diabetes Maternal Grandfather    Hypertension Maternal Grandfather     SOCIAL HISTORY: Social History   Socioeconomic History   Marital status: Single    Spouse name: Not on file   Number of children: 0   Years of education: Not on file   Highest education level: Some college, no degree  Occupational  History    Comment: student in college  Tobacco Use   Smoking status: Never    Passive exposure: Never   Smokeless tobacco: Never  Vaping Use   Vaping Use: Never used  Substance and Sexual Activity   Alcohol use: Never   Drug use: Never   Sexual activity: Yes    Birth control/protection: None  Other Topics Concern   Not on file  Social History Narrative   Lives at college   1 can of [place] per day   Social Determinants of Health   Financial Resource Strain: Not on file  Food Insecurity: No Food Insecurity (01/29/2022)   Hunger Vital Sign    Worried About Running Out of Food in the Last Year: Never true    Ran Out of Food in the Last Year: Never true  Transportation Needs: No Transportation Needs (01/29/2022)  PRAPARE - Administrator, Civil Service (Medical): No    Lack of Transportation (Non-Medical): No  Physical Activity: Not on file  Stress: Not on file  Social Connections: Not on file  Intimate Partner Violence: Not At Risk (02/01/2022)   Humiliation, Afraid, Rape, and Kick questionnaire    Fear of Current or Ex-Partner: No    Emotionally Abused: No    Physically Abused: No    Sexually Abused: No      Levert Feinstein, M.D. Ph.D.  [facility] [address] [place], [place] [address] Ph: [phone] Fax: [phone]  CC:  Levert Feinstein, MD [address] [place],  [place] [address]  Pcp, No

## 2022-06-01 NOTE — Telephone Encounter (Signed)
Please finish paperwork for continue IVIG 1 g/kg, divided into 2 days, for 3 more treatment, through previous infusion agent,

## 2022-06-01 NOTE — Procedures (Signed)
Full Name: Savannah Alvarez Gender: Female MRN #: [id number] Date of Birth: 04-05-2000    Visit Date: 06/01/2022 13:13 Age: 22 Years Examining Physician: Dr. Levert Feinstein Referring Physician: Dr. 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, symptoms has much improved with treatment, but with persistent neuropathic pain, limb paresthesia  Summary of the test: Nerve conduction study: Bilateral sural and superficial peroneal sensory response were present, but with evidence of moderately prolonged peak latency,  Right median, ulnar sensory responses also showed mild to moderately prolonged peak latency with normal snap amplitude  Bilateral tibial motor responses were robust, but with evidence of mildly prolonged distal latency, with mildly decreased conduction velocity, F-wave latency was within normal limits  Bilateral peroneal to EDB motor response also showed normal CMAP amplitude, with mild to moderately prolonged distal latency  Right median, ulnar motor responses showed moderately prolonged distal latency, normal CMAP amplitude, within normal range bilateral F-wave latency  Bilateral H-reflexes were normal and symmetric  Electromyography: Selected needle examinations of right upper, lower extremity muscles, right cervical and lumbar paraspinal muscles were normal   Conclusion: This is a mild abnormal.  There is continued evidence demyelinating polyradiculoneuropathy, evident by mildly moderately prolonged peak latency at sensory examination; prolonged distal latency at motor conduction study.  There is no evidence of axonal loss, compared to previous study in December 2023, there is drastic improvement.    ------------------------------- Levert Feinstein, M.D. PhD  [facility] [address] [place], [place] [address] Tel: [phone] Fax: [phone]  Verbal informed consent was obtained from the  patient, patient was informed of potential risk of procedure, including bruising, bleeding, hematoma formation, infection, muscle weakness, muscle pain, numbness, among others.        MNC    Nerve / Sites Muscle Latency Ref. Amplitude Ref. Rel Amp Segments Distance Velocity Ref. Area    ms ms mV mV %  cm m/s m/s mVms  R Median - APB     Wrist APB 6.3 ?4.4 10.5 ?4.0 100 Wrist - APB 7   41.9     Upper arm APB 11.2  10.1  96.1 Upper arm - Wrist 24.6 50 ?49 42.4  R Ulnar - ADM     Wrist ADM 4.4 ?3.3 8.7 ?6.0 100 Wrist - ADM 7   34.5     B.Elbow ADM 6.4  8.5  97.1 B.Elbow - Wrist 16 80 ?49 35.4     A.Elbow ADM 11.0  6.6  78 A.Elbow - B.Elbow 14 30 ?49 30.3  R Peroneal - EDB     Ankle EDB 7.6 ?6.5 4.2 ?2.0 100 Ankle - EDB 9   14.8     Fib head EDB 13.5  2.8  65.9 Fib head - Ankle 24 40 ?44 9.4     Pop fossa EDB 15.3  4.5  160 Pop fossa - Fib head 10 56 ?44 18.7         Pop fossa - Ankle      L Peroneal - EDB     Ankle EDB 5.9 ?6.5 3.6 ?2.0 100 Ankle - EDB 9   10.4     Fib head EDB 12.0  3.5  97.5 Fib head - Ankle 27 44 ?44 11.5     Pop fossa EDB 14.5  5.6  160 Pop fossa - Fib head 12 48 ?44 22.4         Pop fossa -  Ankle      R Tibial - AH     Ankle AH 6.2 ?5.8 6.0 ?4.0 100 Ankle - AH 9   21.5     Pop fossa AH 16.6  7.2  121 Pop fossa - Ankle 40 39 ?41 26.5  L Tibial - AH     Ankle AH 5.9 ?5.8 8.2 ?4.0 100 Ankle - AH 9   27.2     Pop fossa AH 15.3  7.3  88.5 Pop fossa - Ankle 40 43 ?41 27.1                 SNC    Nerve / Sites Rec. Site Peak Lat Ref.  Amp Ref. Segments Distance    ms ms V V  cm  R Sural - Ankle (Calf)     Calf Ankle 4.8 ?4.4 8 ?6 Calf - Ankle 14  L Sural - Ankle (Calf)     Calf Ankle 4.6 ?4.4 8 ?6 Calf - Ankle 14  R Superficial peroneal - Ankle     Lat leg Ankle 5.2 ?4.4 4 ?6 Lat leg - Ankle 14  L Superficial peroneal - Ankle     Lat leg Ankle 4.4 ?4.4 5 ?6 Lat leg - Ankle 14  R Median - Orthodromic (Dig II, Mid palm)     Dig II Wrist 3.6 ?3.4 7 ?10 Dig II -  Wrist 13  R Ulnar - Orthodromic, (Dig V, Mid palm)     Dig V Wrist 4.1 ?3.1 5 ?5 Dig V - Wrist 30                 F  Wave    Nerve F Lat Ref.   ms ms  R Tibial - AH 55.7 ?56.0  L Tibial - AH 54.7 ?56.0  R Ulnar - ADM 33.9 ?32.0           H Reflex    Nerve H Lat Lat Hmax   ms ms   Left Right Ref. Left Right Ref.  Tibial - Soleus 39.0 39.5 ?35.0 31.3 33.0 ?35.0         EMG Summary Table    Spontaneous MUAP Recruitment  Muscle IA Fib PSW Fasc Other Amp Dur. Poly Pattern  R. Tibialis anterior Normal None None None _______ Normal Normal Normal Normal  R. Peroneus longus Normal None None None _______ Normal Normal Normal Normal  R. Vastus lateralis Normal None None None _______ Normal Normal Normal Normal  R. Gastrocnemius (Medial head) Normal None None None _______ Normal Normal Normal Normal  R. Tibialis posterior Normal None None None _______ Normal Normal Normal Normal  R. Lumbar paraspinals (low) Normal None None None _______ Normal Normal Normal Normal  R. Lumbar paraspinals (mid) Normal None None None _______ Normal Normal Normal Normal  R. First dorsal interosseous Normal None None None _______ Normal Normal Normal Normal  R. Pronator teres Normal None None None _______ Normal Normal Normal Normal  R. Biceps brachii Normal None None None _______ Normal Normal Normal Normal  R. Deltoid Normal None None None _______ Normal Normal Normal Normal  R. Triceps brachii Normal None None None _______ Normal Normal Normal Normal  R. Cervical paraspinals Normal None None None _______ Normal Normal Normal Normal

## 2022-06-02 NOTE — Telephone Encounter (Signed)
Paperwork placed in MD office for review and signature

## 2022-06-03 ENCOUNTER — Encounter (HOSPITAL_COMMUNITY): Payer: Self-pay

## 2022-06-03 ENCOUNTER — Other Ambulatory Visit: Payer: Self-pay

## 2022-06-03 ENCOUNTER — Emergency Department (HOSPITAL_COMMUNITY)
Admission: EM | Admit: 2022-06-03 | Discharge: 2022-06-04 | Disposition: A | Discharge status: Home / Self Care | Payer: Medicaid Other | Attending: Emergency Medicine | Admitting: Emergency Medicine

## 2022-06-03 DIAGNOSIS — R200 Anesthesia of skin: Secondary | ICD-10-CM

## 2022-06-03 LAB — COMPREHENSIVE METABOLIC PANEL
ALT: 18 U/L (ref 0–44)
AST: 23 U/L (ref 15–41)
Albumin: 3.7 g/dL (ref 3.5–5.0)
Alkaline Phosphatase: 54 U/L (ref 38–126)
Anion gap: 9 (ref 5–15)
BUN: 15 mg/dL (ref 6–20)
CO2: 22 mmol/L (ref 22–32)
Calcium: 9.2 mg/dL (ref 8.9–10.3)
Chloride: 102 mmol/L (ref 98–111)
Creatinine, Ser: 0.74 mg/dL (ref 0.44–1.00)
GFR, Estimated: >60 mL/min (ref >60)
Glucose, Bld: 80 mg/dL (ref 70–99)
Potassium: 3.5 mmol/L (ref 3.5–5.1)
Sodium: 133 mmol/L — ABNORMAL LOW (ref 135–145)
Total Bilirubin: 0.8 mg/dL (ref 0.3–1.2)
Total Protein: 7.8 g/dL (ref 6.5–8.1)

## 2022-06-03 LAB — CBC WITH DIFFERENTIAL/PLATELET
Abs Immature Granulocytes: 0.02 10*3/uL (ref 0.00–0.07)
Basophils Absolute: 0 10*3/uL (ref 0.0–0.1)
Basophils Relative: 0 %
Eosinophils Absolute: 0 10*3/uL (ref 0.0–0.5)
Eosinophils Relative: 0 %
HCT: 37.1 % (ref 36.0–46.0)
Hemoglobin: 11.6 g/dL — ABNORMAL LOW (ref 12.0–15.0)
Immature Granulocytes: 0 %
Lymphocytes Relative: 19 %
Lymphs Abs: 1.2 10*3/uL (ref 0.7–4.0)
MCH: 25.3 pg — ABNORMAL LOW (ref 26.0–34.0)
MCHC: 31.3 g/dL (ref 30.0–36.0)
MCV: 80.8 fL (ref 80.0–100.0)
Monocytes Absolute: 0.5 10*3/uL (ref 0.1–1.0)
Monocytes Relative: 9 %
Neutro Abs: 4.5 10*3/uL (ref 1.7–7.7)
Neutrophils Relative %: 72 %
Platelets: 461 10*3/uL — ABNORMAL HIGH (ref 150–400)
RBC: 4.59 MIL/uL (ref 3.87–5.11)
RDW: 14.8 % (ref 11.5–15.5)
WBC: 6.3 10*3/uL (ref 4.0–10.5)
nRBC: 0 % (ref 0.0–0.2)

## 2022-06-03 LAB — I-STAT BETA HCG BLOOD, ED (MC, WL, AP ONLY): I-stat hCG, quantitative: <5 m[IU]/mL (ref <5)

## 2022-06-03 NOTE — ED Provider Triage Note (Signed)
Emergency Medicine Provider Triage Evaluation Note  Yesena Greenstein , a 22 y.o. female  was evaluated in triage.  Pt complains of paresthesias.  Patient was recently hospitalized for got given bradycardia, seen by her neurologist on Monday this week.  She felt fine until this morning when she woke up having numbness distal to her elbows bilaterally distal to her knees bilaterally.  Also cannot feel anything on her back.  Denies any recent trauma or injuries.  No fevers..  Review of Systems  Per HPI  Physical Exam  BP 104/78 (BP Location: Right Arm)   Pulse (!) 114   Temp 99.4 F (37.4 C)   Resp 17   SpO2 98%  Gen:   Awake, no distress   Resp:  Normal effort  MSK:   Moves extremities without difficulty  Other:  Decreased sensation  Medical Decision Making  Medically screening exam initiated at 4:20 PM.  Appropriate orders placed.  Maricruz Muhlestein was informed that the remainder of the evaluation will be completed by another provider, this initial triage assessment does not replace that evaluation, and the importance of remaining in the ED until their evaluation is complete.     Theron Arista, PA-C 06/03/22 1621

## 2022-06-03 NOTE — ED Triage Notes (Signed)
Pt came in via POV d/t numbness she states started this morning from her mid back down to her knees, which she can feel some things. But her knees down she states are completley numb with no feeling. Does have Hx of Tx for [facility], denies any recent falls or injuries, A/Ox4, 8/10 back pain.

## 2022-06-03 NOTE — ED Provider Notes (Signed)
[place] EMERGENCY DEPARTMENT AT [facility] Provider Note   CSN: [id number] Arrival date & time: 06/03/22  1540     History No chief complaint on file.   HPI Savannah Alvarez is a 22 y.o. female presenting for complaint of numbness.  It starts around L1 in her back and radiates down her bilateral lower extremities.  She endorses most severe symptoms from L5-S1 with very poor sensation at this location.  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  Had been improving outpatient setting with IVIG infusions and improving myelogram studies in the outpatient setting but suddenly woke up this morning 9:30 AM significantly worse..   Patient's recorded medical, surgical, social, medication list and allergies were reviewed in the Snapshot window as part of the initial history.   Review of Systems   Review of Systems  Constitutional:  Negative for chills and fever.  HENT:  Negative for ear pain and sore throat.   Eyes:  Negative for pain and visual disturbance.  Respiratory:  Negative for cough and shortness of breath.   Cardiovascular:  Negative for chest pain and palpitations.  Gastrointestinal:  Negative for abdominal pain and vomiting.  Genitourinary:  Negative for dysuria and hematuria.  Musculoskeletal:  Negative for arthralgias and back pain.  Skin:  Negative for color change and rash.  Neurological:  Positive for numbness. Negative for seizures and syncope.  All other systems reviewed and are negative.   Physical Exam Updated Vital Signs BP 109/62   Pulse 96   Temp 99.1 F (37.3 C) (Oral)   Resp 18   SpO2 98%  Physical Exam Vitals and nursing note reviewed.  Constitutional:      General: She is not in acute distress.    Appearance: She is well-developed.  HENT:     Head: Normocephalic and atraumatic.  Eyes:     Conjunctiva/sclera: Conjunctivae normal.  Cardiovascular:     Rate and Rhythm: Normal rate and  regular rhythm.     Heart sounds: No murmur heard. Pulmonary:     Effort: Pulmonary effort is normal. No respiratory distress.     Breath sounds: Normal breath sounds.  Abdominal:     General: There is no distension.     Palpations: Abdomen is soft.     Tenderness: There is no abdominal tenderness. There is no right CVA tenderness or left CVA tenderness.  Musculoskeletal:        General: No swelling or tenderness. Normal range of motion.     Cervical back: Neck supple.  Skin:    General: Skin is warm and dry.  Neurological:     General: No focal deficit present.     Mental Status: She is alert and oriented to person, place, and time. Mental status is at baseline.     Cranial Nerves: No cranial nerve deficit.     Sensory: Sensory deficit present.      ED Course/ Medical Decision Making/ A&P Clinical Course as of 06/03/22 2346  Wed Jun 03, 2022  2217 [facility] Ed Bed 10  MRN [id number] Sudden onset BL numbness in a 22 YO known GB syndrome patient L3-S1 all involved. Thanks! CRC  [id number]  [CC]  2331 Dr. Derry Lory to see [CC]    Clinical Course User Index [CC] Glyn Ade, MD    Procedures Procedures   Medications Ordered in ED Medications - No data to display  Medical Decision Making:   Patient's history of present illness  and physical exam findings are difficult to discern.  Her objective findings are reassuring with negative CBC CMP for any acute pathology including metabolic or hematologic pathology. Neurologically, patient is endorsing numbness of the lower extremities but can lift both extremities against gravity, I do not believe this is consistent with a recurrence of her Guillain-Barr.  Additionally, the sensory losses are bilateral and only to pressure as her painful sensation and cold sensation appear to be intact on my exam. Given the clinical overlap and patient's syndrome, I consulted neurology for further care and management.  Had a conversation with Dr.  Derry Lory who is going to evaluate patient at bedside and provide further recommendations.  We are considering repeat infusion of IVIG as a possible diagnostic/therapeutic intervention. Patient in no acute distress at this time.  Neurology recommendations are pending at time of handoff to oncoming provider, please see their note for any further disposition changes management changes. Clinical Impression:  1. Numbness      Data Unavailable   Final Clinical Impression(s) / ED Diagnoses Final diagnoses:  Numbness    Rx / DC Orders ED Discharge Orders     None         Glyn Ade, MD 06/04/22 [phone]

## 2022-06-04 DIAGNOSIS — R200 Anesthesia of skin: Secondary | ICD-10-CM

## 2022-06-04 NOTE — ED Provider Notes (Signed)
Seen by neurology Dr. Derry Lory He does not feel patient requires admission or further treatment.  She is safe for discharge home and follow-up with her neurologist   Zadie Rhine, MD 06/04/22 [id number]

## 2022-06-04 NOTE — Consult Note (Signed)
NEUROLOGY CONSULTATION NOTE   Date of service: June 04, 2022 Patient Name: Savannah Alvarez MRN:  [id number] DOB:  [birth year] Reason for consult: "BL lower ext numbness" Requesting Provider: Zadie Rhine, MD _ _ _   _ __   _ __ _ _  __ __   _ __   __ _  History of Present Illness  Savannah Alvarez is a 22 y.o. 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  She woke up this AM with numbness in BL arms from elbow downwards and BL legs from knees downwards.  Reports, recently diagnosed with bronchitis on CT Chest and has developed some dry cough since. Reports significantly poor sleep. Wakes up a couple hours after going to bed and then can't fall asleep. Recently started trazodone but that has not helped despite taking it for more than 2 weeks now.  Denies any substance use, anxiety or other stressors in life.   ROS   Constitutional Denies weight loss, fever and chills.   HEENT Denies changes in vision and hearing.   Respiratory + SOB and cough.   CV Denies palpitations and CP   GI Denies abdominal pain, nausea, vomiting and diarrhea.   GU Denies dysuria and urinary frequency.   MSK Denies myalgia and joint pain.   Skin Denies rash and pruritus.   Neurological Denies headache and syncope.   Psychiatric Denies recent changes in mood. Denies anxiety and depression.    Past History   Past Medical History:  Diagnosis Date   Eczema    Past Surgical History:  Procedure Laterality Date   HIP SURGERY Right    Family History  Problem Relation Age of Onset   Hypertension Father    Diabetes Father    Hyperthyroidism Mother    Diabetes Maternal Grandmother    Hypertension Maternal Grandmother    Diabetes Maternal Grandfather    Hypertension Maternal Grandfather    Social History   Socioeconomic History   Marital  status: Single    Spouse name: Not on file   Number of children: 0   Years of education: Not on file   Highest education level: Some college, no degree  Occupational History    Comment: student in college  Tobacco Use   Smoking status: Never    Passive exposure: Never   Smokeless tobacco: Never  Vaping Use   Vaping Use: Never used  Substance and Sexual Activity   Alcohol use: Never   Drug use: Never   Sexual activity: Yes    Birth control/protection: None  Other Topics Concern   Not on file  Social History Narrative   Lives at college   1 can of [place] per day   Social Determinants of Health   Financial Resource Strain: Not on file  Food Insecurity: No Food Insecurity (01/29/2022)   Hunger Vital Sign    Worried About Running Out of Food in the Last Year: Never true    Ran Out of Food in the Last Year: Never true  Transportation Needs: No Transportation Needs (01/29/2022)   PRAPARE - Administrator, Civil Service (Medical): No    Lack of Transportation (Non-Medical): No  Physical Activity: Not on file  Stress: Not on file  Social Connections: Not on file   Allergies  Allergen Reactions   Other Swelling  Onions, Cantaloupe & tomato    Medications  (Not in a hospital admission)    Vitals   Vitals:   06/04/22 0016 06/04/22 0030 06/04/22 0100 06/04/22 0115  BP:  (!) 106/57 102/61 105/61  Pulse:  87 93 96  Resp:  18 (!) 21 16  Temp: 99.6 F (37.6 C)     TempSrc: Oral     SpO2:  100% 99% 100%     There is no height or weight on file to calculate BMI.  Physical Exam   General: Laying comfortably in bed; in no acute distress.  HENT: Normal oropharynx and mucosa. Normal external appearance of ears and nose.  Neck: Supple, no pain or tenderness  CV: No JVD. No peripheral edema.  Pulmonary: Symmetric Chest rise. Normal respiratory effort.  Abdomen: Soft to touch, non-tender.  Ext: No cyanosis, edema, or deformity  Skin: No rash. Normal palpation  of skin.   Musculoskeletal: Normal digits and nails by inspection. No clubbing.   Neurologic Examination  Mental status/Cognition: Alert, oriented to self, place, month and year, good attention.  Speech/language: Fluent, comprehension intact, object naming intact, repetition intact.  Cranial nerves:   CN II Pupils equal and reactive to light, no VF deficits    CN III,IV,VI EOM intact, no gaze preference or deviation, no nystagmus    CN V normal sensation in V1, V2, and V3 segments bilaterally    CN VII no asymmetry, no nasolabial fold flattening    CN VIII normal hearing to speech    CN IX & X normal palatal elevation, no uvular deviation    CN XI 5/5 head turn and 5/5 shoulder shrug bilaterally    CN XII midline tongue protrusion   Motor:  Muscle bulk: normal, tone normal, pronator drift none tremor none Mvmt Root Nerve  Muscle Right Left Comments  SA C5/6 Ax Deltoid 5 5   EF C5/6 Mc Biceps 5 5   EE C6/7/8 Rad Triceps 5 5   WF C6/7 Med FCR     WE C7/8 PIN ECU     F Ab C8/T1 U ADM/FDI 5 5   HF L1/2/3 Fem Illopsoas 5 5   KE L2/3/4 Fem Quad 5 5   DF L4/5 D Peron Tib Ant 5 5   PF S1/2 Tibial Grc/Sol 5 5    Reflexes:  Right Left Comments  Pectoralis      Biceps (C5/6) 2 2   Brachioradialis (C5/6) 2 2    Triceps (C6/7) 2 2    Patellar (L3/4) 2 2    Achilles (S1) 2 2    Hoffman      Plantar down down   Jaw jerk    Sensation:  Light touch Decreased in BL uppers from elbow down and BL lowers from knee down   Pin prick Decreased in BL uppers from elbow down and BL lowers from knee down   Temperature    Vibration Decreased in BL big toes  Proprioception    Coordination/Complex Motor:  - Finger to Nose intact BL - Heel to shin intact BL - Rapid alternating movement are normal - Gait: Stride length normal. Arm swing normal. Base width narrow. Can toe walk and heel walk.  Labs   CBC:  Recent Labs  Lab 06/03/22 1620  WBC 6.3  NEUTROABS 4.5  HGB 11.6*  HCT 37.1  MCV  80.8  PLT 461*    Basic Metabolic Panel:  Lab Results  Component Value Date   NA 133 (  L) 06/03/2022   K 3.5 06/03/2022   CO2 22 06/03/2022   GLUCOSE 80 06/03/2022   BUN 15 06/03/2022   CREATININE 0.74 06/03/2022   CALCIUM 9.2 06/03/2022   GFRNONAA >60 06/03/2022   Lipid Panel: No results found for: "LDLCALC" HgbA1c:  Lab Results  Component Value Date   HGBA1C 5.1 01/27/2022   Urine Drug Screen: No results found for: "LABOPIA", "COCAINSCRNUR", "LABBENZ", "AMPHETMU", "THCU", "LABBARB"  Alcohol Level No results found for: "ETH"  Impression   Savannah Alvarez is a 22 y.o. 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  She woke up this AM with numbness in BL arms from elbow downwards and BL legs from knees downwards.  Despite subjective sensory deficit, she has no weakness, no inccordination. Can do heel to shin with her eyes closed and no sensory ataxia despite decreased vibratory sensation, rhomberg negative.  Overall, this could ne recrudescence and worsening sensory neuropathy in the setting of bronchitis, poor sleep vs CIDP or could be functional neurologic disorder with sensory symptoms. Given her deficit does not look disabling at this time, I recommend that she follow up with Dr. Terrace Arabia and consider repeat EMG in a week or 2. I discussed with patient that if she was to worsen or symptoms disabling, she should come to the ED again.  Recommendations  - follow up with Dr. Terrace Arabia outpatient. Okay to discharge from a neuro standpoint.  Plan discussed with patient, her mother at bedside and with Dr. Bebe Shaggy in person. ______________________________________________________________________   Thank you for the opportunity to take part in the care of this patient. If you have any further questions, please contact the  neurology consultation attending.  Signed,  Erick Blinks [facility] Pager Number [phone] _ _ _   _ __   _ __ _ _  __ __   _ __   __ _

## 2022-06-16 ENCOUNTER — Telehealth: Payer: Self-pay | Admitting: Neurology

## 2022-06-16 NOTE — Telephone Encounter (Signed)
Elita Quick called [facility]. Requesting pt medicals records be faxed [phone].

## 2022-08-03 ENCOUNTER — Encounter (HOSPITAL_BASED_OUTPATIENT_CLINIC_OR_DEPARTMENT_OTHER): Payer: Self-pay

## 2022-08-04 ENCOUNTER — Ambulatory Visit (INDEPENDENT_AMBULATORY_CARE_PROVIDER_SITE_OTHER): Payer: Medicaid Other | Admitting: Cardiology

## 2022-08-04 ENCOUNTER — Encounter (HOSPITAL_BASED_OUTPATIENT_CLINIC_OR_DEPARTMENT_OTHER): Payer: Self-pay | Admitting: Cardiology

## 2022-08-04 VITALS — BP 109/71 | HR 92 | Ht 62.0 in | Wt 145.5 lb

## 2022-08-04 DIAGNOSIS — R42 Dizziness and giddiness: Secondary | ICD-10-CM

## 2022-08-04 DIAGNOSIS — G610 Guillain-Barre syndrome: Secondary | ICD-10-CM | POA: Diagnosis not present

## 2022-08-04 DIAGNOSIS — Z8249 Family history of ischemic heart disease and other diseases of the circulatory system: Secondary | ICD-10-CM | POA: Diagnosis not present

## 2022-08-04 DIAGNOSIS — R079 Chest pain, unspecified: Secondary | ICD-10-CM

## 2022-08-04 NOTE — Patient Instructions (Addendum)
Medication Instructions:  Your physician recommends that you continue on your current medications as directed. Please refer to the Current Medication list given to you today.   Labwork: NONE  Testing/Procedures: Your physician has requested that you have an echocardiogram. Echocardiography is a painless test that uses sound waves to create images of your heart. It provides your doctor with information about the size and shape of your heart and how well your heart's chambers and valves are working. This procedure takes approximately one hour. There are no restrictions for this procedure. Please do NOT wear cologne, perfume, aftershave, or lotions (deodorant is allowed). Please arrive 15 minutes prior to your appointment time.   Follow-Up: AS NEEDED   Any Other Special Instructions Will Be Listed Below (If Applicable).  -avoid dehydration. Often it requires high volumes of fluids, often with salt/electrolytes included, to stay hydrated. People with orthostasis are very sensitive to fluid shifts and dehydration. Oral rehydration is preferred, and routine use of IV fluids is not recommended. -if tolerated, compression stocking can assist with fluid management and prevent pooling in the legs. -slow position changes are recommended -if there is a feeling of severe lightheadedness, like near to passing out, recommend lying on the floor on the back, with legs elevated up on a chair or up against the wall. -the best long term management is gradual exercise conditioning. I recommend seated exercises such as bike to start, to avoid the risk of falling with lightheadedness. Exercise programs, either through supervised programs like cardiac rehab or through personal programs, should focus on gradually increasing exercise tolerance and conditioning.   -this is a link to specific exercise recommendations for  POTS: http://peterson-powell.net/ -we discussed the typical spectrum of dysautonomia, including typical populations, that this sometimes spontaneously improves with age (though a small percentage have persistent symptoms), that this has uncomfortable symptoms but is not associated with long term mortality, and that the etiology/treatment of this is an area of active research. Reliable information is found at dysautonomia international dot org

## 2022-08-04 NOTE — Progress Notes (Signed)
Cardiology Office Note:  .   Date:  08/04/2022  ID:  Savannah Alvarez, DOB May 31, 2000, MRN [id number] PCP: Ellis Parents, FNP  [facility] Providers Cardiologist:  Jodelle Red, MD {  History of Present Illness: Savannah Alvarez is a 22 y.o. female with PMH AIDP followed by neurology, referred to cardiology for dizziness. Notes from [facility] reviewed, referral 07/28/22. Concern for dysautonomia. ECG with NSR. CTPE unremarkable.  Today: Patient concerned that she has POTS. Has dizziness, temperature regulation issues. Feels like she will pass out if she stands too long. Has chest pain that wakes her up at night. Feels nauseated constantly.   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 Chest pains have been going on for a long time, and she has had more mild symptoms of lightheadedness for a time. 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   No full syncope. Has symptoms every day. No clear aggravating/alleviating symptoms.   Reviewed dysautonomia today, thought she does not meet criteria on exam today.  FH: maternal grandmother with multiple MI, stents. Father's side unknown except for high blood pressure  and diabetes. Had a cousin died at age 69 of unclear etiology, no other known unexplained/sudden death.  ROS: Denies chest pain, shortness of breath at rest or with normal exertion. No PND, orthopnea, LE edema or unexpected weight gain. No syncope or palpitations. ROS otherwise negative except as noted.   Studies Reviewed: Marland Kitchen    EKG:  not performed   Physical Exam:   VS:  BP 109/71 (BP Location: Left Arm, Patient Position: Sitting, Cuff Size: Normal)   Pulse 92   Ht 5\' 2"  (1.575 m)   Wt 145 lb 8 oz (66 kg)   SpO2 100%   BMI 26.61 kg/m    Orthostatic VS for the past 24 hrs (Last 3 readings):  BP- Lying Pulse- Lying BP- Sitting Pulse- Sitting BP- Standing at 0 minutes Pulse- Standing at 0 minutes BP- Standing at 3 minutes Pulse- Standing  at 3 minutes  08/04/22 0902 107/69 92 112/69 105 109/70 103 105/68 99     Wt Readings from Last 3 Encounters:  08/04/22 145 lb 8 oz (66 kg)  06/01/22 136 lb (61.7 kg)  05/27/22 136 lb (61.7 kg)    GEN: Well nourished, well developed in no acute distress HEENT: Normal, moist mucous membranes NECK: No JVD CARDIAC: regular rhythm, normal S1 and S2, no rubs or gallops. No murmur. VASCULAR: Radial and DP pulses 2+ bilaterally. No carotid bruits RESPIRATORY:  Clear to auscultation without rales, wheezing or rhonchi  ABDOMEN: Soft, non-tender, non-distended MUSCULOSKELETAL:  Ambulates independently SKIN: Warm and dry, no edema NEUROLOGIC:  Alert and oriented x 3. No focal neuro deficits noted. PSYCHIATRIC:  Normal affect    ASSESSMENT AND PLAN: .   Atypical chest pain Lightheadedness/dizziness Nausea Fatigue Presyncope -her concern was for POTS. She does not meet criteria for this or orthostatic hypotension today -however, given her AIDP/Guillain-Barre, it is possible she may have a neuro component similar to dysautonomia -will get echo to rule out structural abnormalities -given instructions on management of symptoms  Family history of heart disease: discussed today  Dispo: as needed, as she already follows with neurology  Signed, Jodelle Red, MD   Jodelle Red, MD, PhD, [facility]   [facility]    Heart & Vascular at [facility] at [facility] [address] [place], [place] [address] [phone]   Patient Instructions  Medication Instructions:  Your physician recommends that you continue on your current medications as directed. Please refer to the Current Medication list given to you today.   Labwork: NONE  Testing/Procedures: Your physician has requested that you have an echocardiogram. Echocardiography is a painless test that uses sound waves to create images of your heart. It provides your  doctor with information about the size and shape of your heart and how well your heart's chambers and valves are working. This procedure takes approximately one hour. There are no restrictions for this procedure. Please do NOT wear cologne, perfume, aftershave, or lotions (deodorant is allowed). Please arrive 15 minutes prior to your appointment time.   Follow-Up: AS NEEDED   Any Other Special Instructions Will Be Listed Below (If Applicable).  -avoid dehydration. Often it requires high volumes of fluids, often with salt/electrolytes included, to stay hydrated. People with orthostasis are very sensitive to fluid shifts and dehydration. Oral rehydration is preferred, and routine use of IV fluids is not recommended. -if tolerated, compression stocking can assist with fluid management and prevent pooling in the legs. -slow position changes are recommended -if there is a feeling of severe lightheadedness, like near to passing out, recommend lying on the floor on the back, with legs elevated up on a chair or up against the wall. -the best long term management is gradual exercise conditioning. I recommend seated exercises such as bike to start, to avoid the risk of falling with lightheadedness. Exercise programs, either through supervised programs like cardiac rehab or through personal programs, should focus on gradually increasing exercise tolerance and conditioning.   -this is a link to specific exercise recommendations for POTS: http://peterson-powell.net/ -we discussed the typical spectrum of dysautonomia, including typical populations, that this sometimes spontaneously improves with age (though a small percentage have persistent symptoms), that this has uncomfortable symptoms but is not associated with long term mortality, and that the etiology/treatment of this is an area of active research. Reliable information is found at  dysautonomia international dot org

## 2022-08-07 IMAGING — CT CT NECK W/ CM
4 of 5 series · 14 of 33 positions shown, 16 images · IV contrast (APPLIED)
Comparison: None available.

CLINICAL DATA: Initial evaluation for soft tissue mass of neck.

EXAM:
CT NECK WITH CONTRAST
TECHNIQUE: Multidetector CT imaging of the neck was performed using the
standard protocol following the bolus administration of intravenous
contrast.
CONTRAST:  50mL OMNIPAQUE IOHEXOL 300 MG/ML  SOLN

[Series 2: axial neck (person_name) · axial · 0.61mm/px · z∈[-364,-308]mm · 2 of 85 slices shown]
[im 29/85  bone]
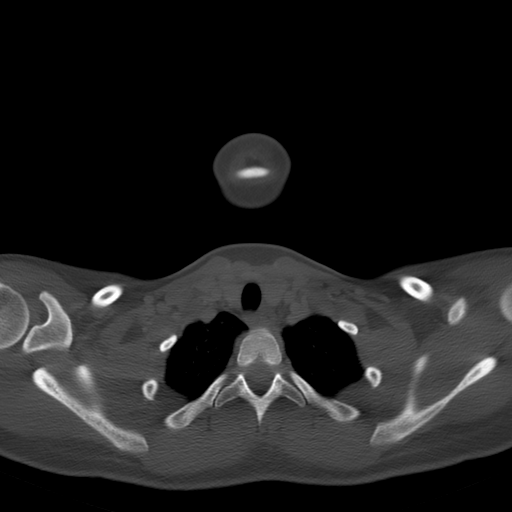
[im 57/85  bone]
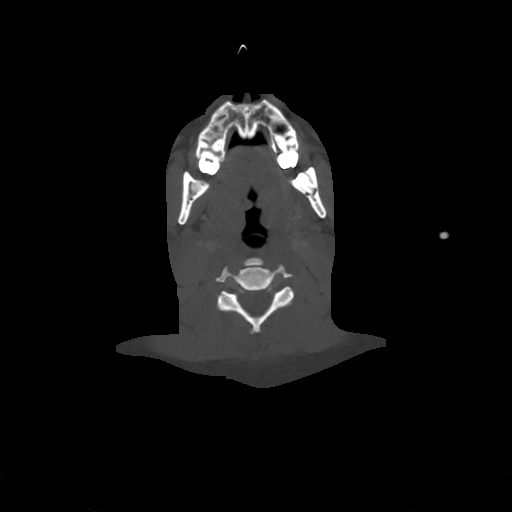

[Series 4: cor neck · coronal · 0.44mm/px · 3 of 103 slices shown]
[im 32/103  bone]
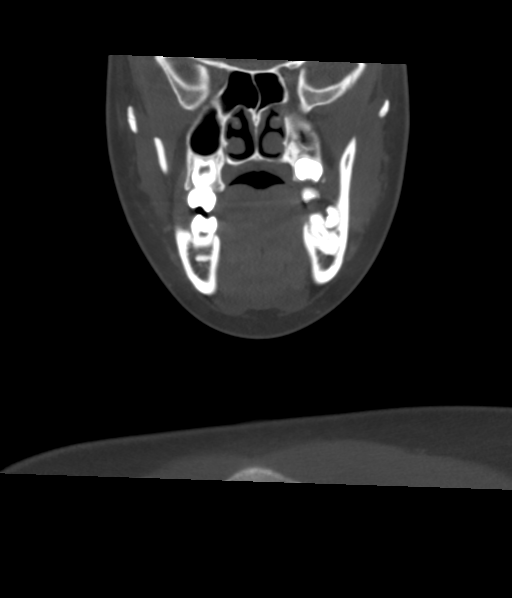
[im 45/103  bone]
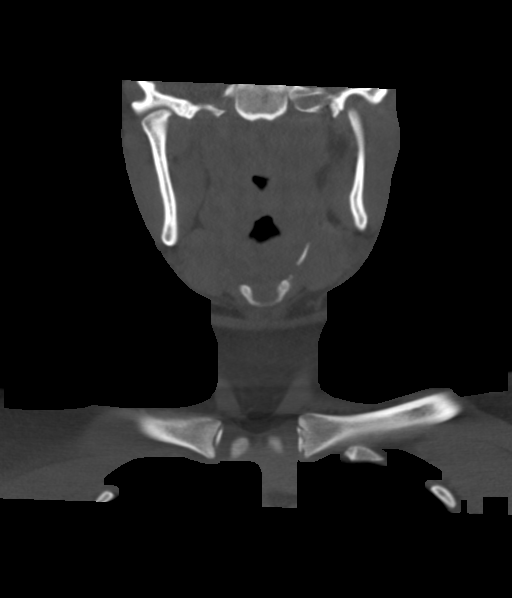
[im 58/103  bone]
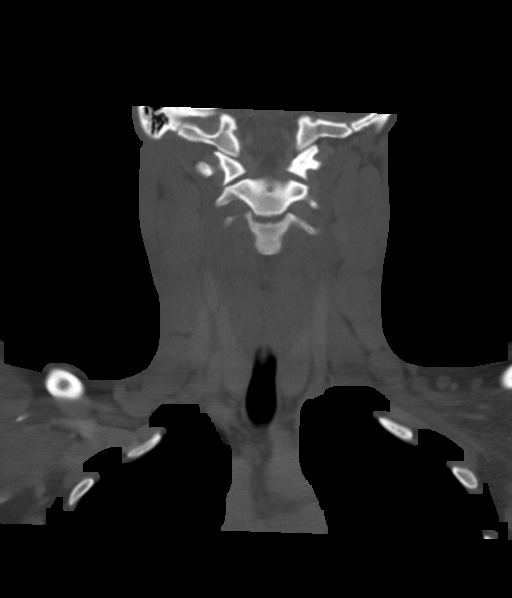

[Series 5: sag neck · sagittal · 0.41mm/px · 5 of 101 slices shown, 6 images]
[im 34/101  bone]
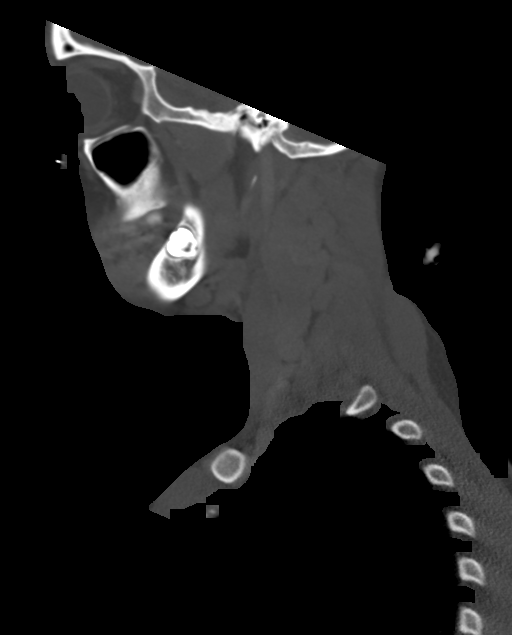
[im 42/101  bone]
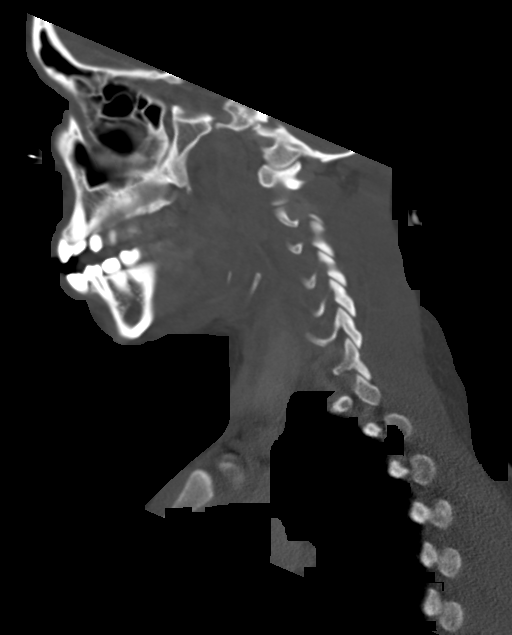
[im 51/101  soft-tissue]
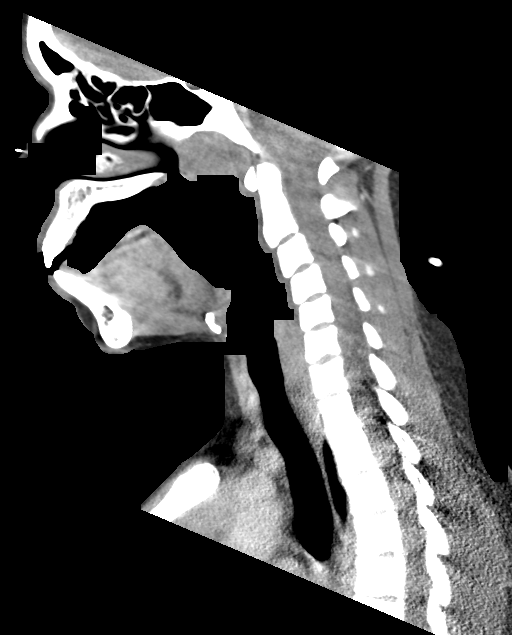
[im 51/101  bone]
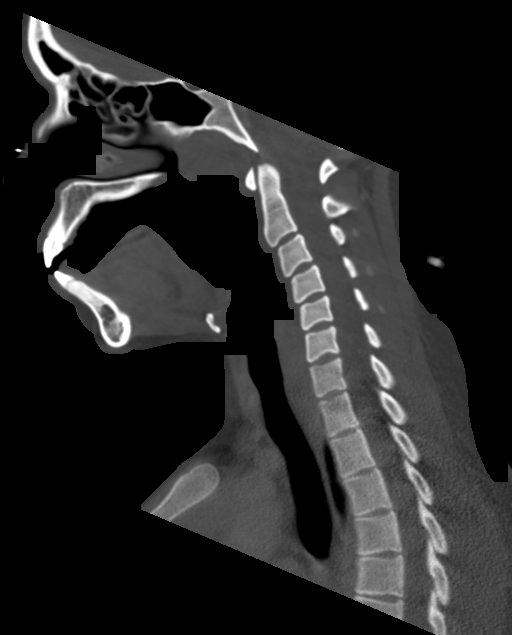
[im 59/101  bone]
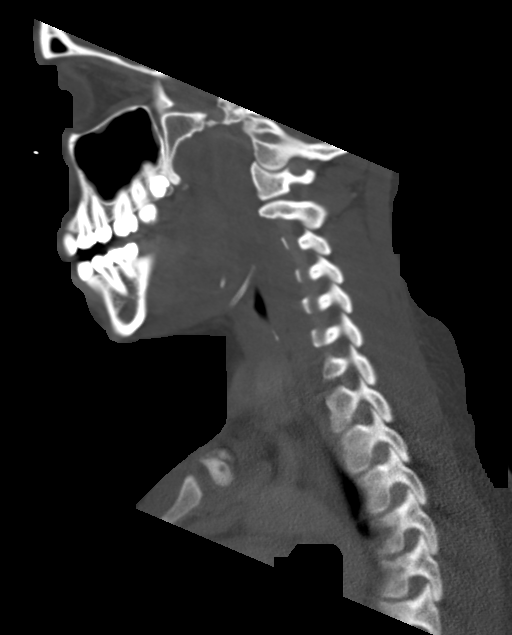
[im 67/101  bone]
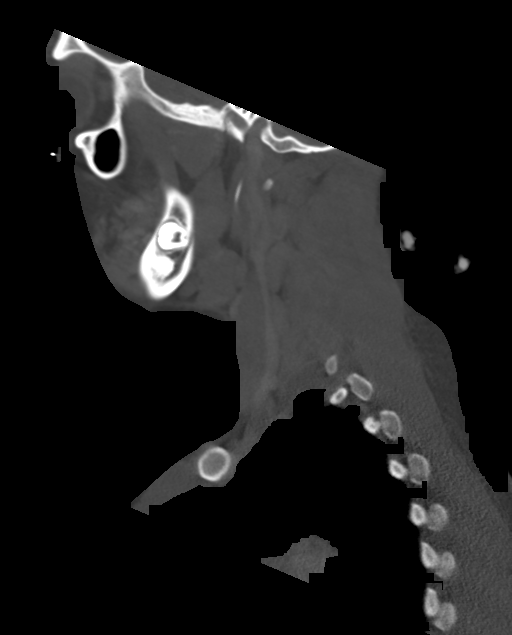

[Series 6: ax oropharynx · axial · 0.40mm/px · z∈[-450,-306]mm · 4 of 130 slices shown, 5 images]
[im 26/130  soft-tissue]
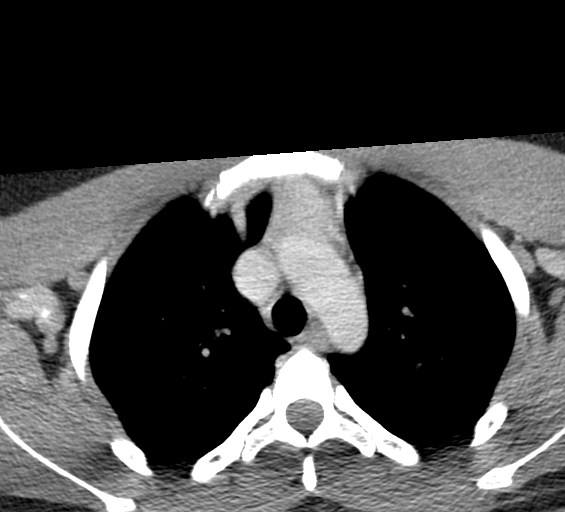
[im 26/130  bone]
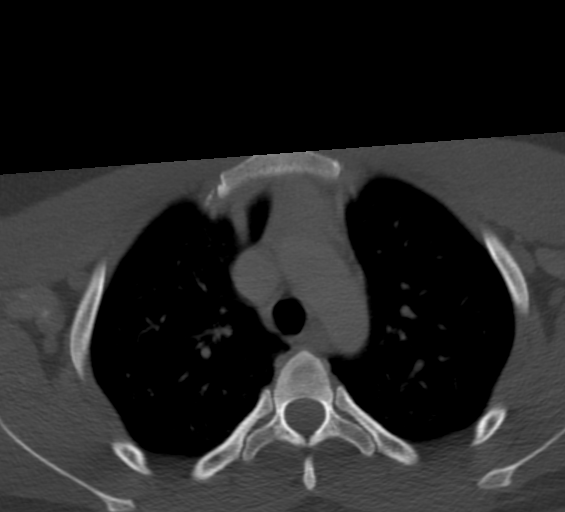
[im 52/130  bone]
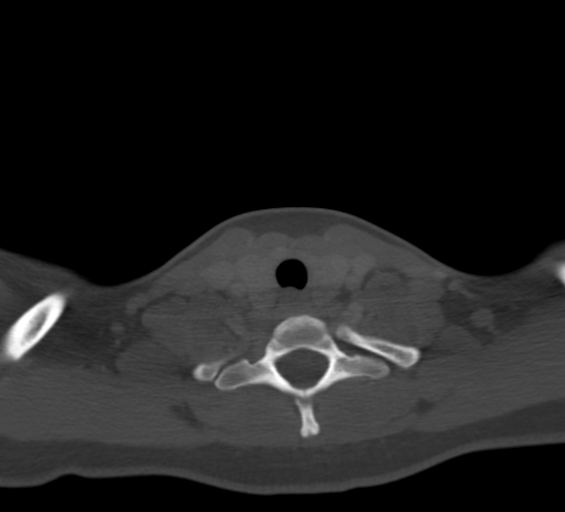
[im 78/130  bone]
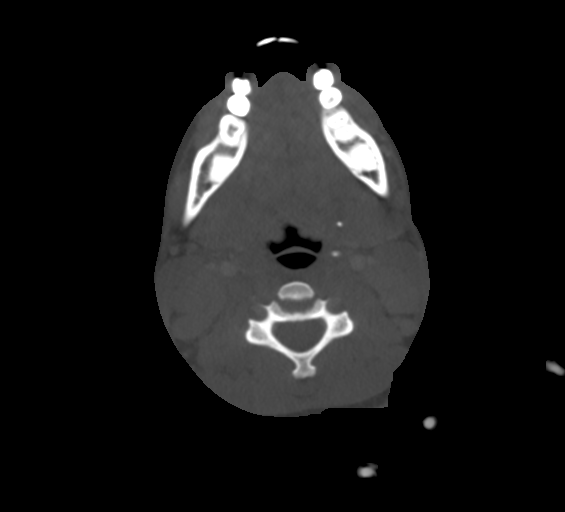
[im 104/130  bone]
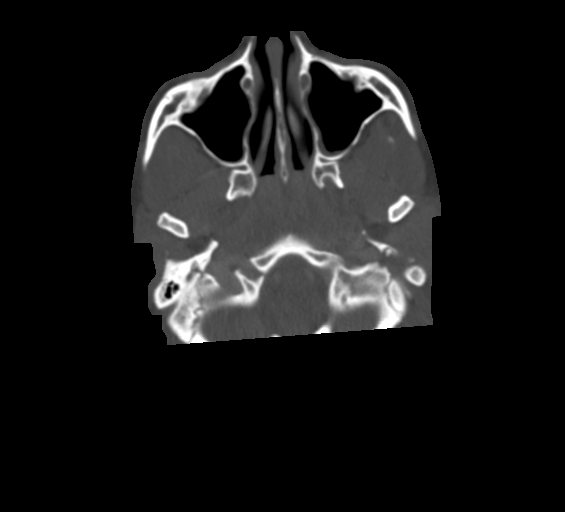

[14 of 33 positions shown; findings below may reference images not displayed]

FINDINGS: Pharynx and larynx: Oral cavity within normal limits. No acute
abnormality about the dentition. Palatine tonsils are hypertrophied
and prominent bilaterally as are the adenoidal soft tissues. No
discrete tonsillar or peritonsillar abscess. Suspected mild mucosal
edema about the right oropharyngeal mucosa, suggesting possible mild
pharyngitis. The right piriform sinus is effaced. Associated subtle
stranding within the adjacent right parapharyngeal and submandibular
spaces. Retropharyngeal soft tissues within normal limits.
Epiglottis itself within normal limits. Glottis normal. Subglottic
airway patent clear.

Salivary glands: Salivary glands including the parotid and
submandibular glands are normal.

Thyroid: Normal.

Lymph nodes: Extensive bulky adenopathy seen along both cervical
chains bilaterally. Largest nodes seen at level II B, measuring up
to 2.1 cm on the right and 2.3 cm on the left. No associated
necrosis or suppuration.

Vascular: Normal intravascular enhancement seen throughout the neck.

Limited intracranial: Unremarkable.

Visualized orbits: Unremarkable.

Mastoids and visualized paranasal sinuses: Minimal mucoperiosteal
thickening noted within the ethmoidal air cells and maxillary
sinuses. Visualized paranasal sinuses are otherwise clear.
Visualized mastoids clear.

Skeleton: No acute osseous finding. No discrete or worrisome osseous
lesions.

Upper chest: Visualized upper chest demonstrates no acute finding.
Partially visualized lungs are clear.

Other: None.
IMPRESSION: 1. Enlarged and prominent palatine tonsils and adenoidal soft
tissues, with suspected mild mucosal edema involving the right
pharynx. Findings suggestive of acute tonsillitis/pharyngitis. No
discrete abscess or drainable fluid collection.
2. Extensive bulky adenopathy extending along both cervical chains,
nonspecific, but presumably reactive in nature. Clinical follow-up
to resolution recommended as a possible lymphoproliferative disorder
or other neoplastic process could also have this appearance.

## 2022-09-09 ENCOUNTER — Ambulatory Visit (HOSPITAL_BASED_OUTPATIENT_CLINIC_OR_DEPARTMENT_OTHER): Payer: Medicaid Other

## 2022-09-09 DIAGNOSIS — R079 Chest pain, unspecified: Secondary | ICD-10-CM | POA: Diagnosis not present

## 2022-09-09 LAB — ECHOCARDIOGRAM COMPLETE
AR max vel: 1.85 cm2
AV Area VTI: 1.82 cm2
AV Area mean vel: 2.03 cm2
AV Mean grad: 3 mmHg
AV Peak grad: 5.6 mmHg
Ao pk vel: 1.18 m/s
Area-P 1/2: 4.57 cm2
S' Lateral: 2.66 cm

## 2022-09-21 ENCOUNTER — Other Ambulatory Visit: Payer: Self-pay

## 2022-09-21 ENCOUNTER — Emergency Department (HOSPITAL_BASED_OUTPATIENT_CLINIC_OR_DEPARTMENT_OTHER)
Admission: EM | Admit: 2022-09-21 | Discharge: 2022-09-21 | Disposition: A | Discharge status: Home / Self Care | Payer: Medicaid Other | Source: Home / Self Care | Attending: Emergency Medicine | Admitting: Emergency Medicine

## 2022-09-21 ENCOUNTER — Encounter (HOSPITAL_BASED_OUTPATIENT_CLINIC_OR_DEPARTMENT_OTHER): Payer: Self-pay | Admitting: Emergency Medicine

## 2022-09-21 DIAGNOSIS — L509 Urticaria, unspecified: Secondary | ICD-10-CM | POA: Insufficient documentation

## 2022-09-21 MED ORDER — PREDNISONE 10 MG (21) PO TBPK: ORAL_TABLET | ORAL | 0 refills | Status: DC

## 2022-09-21 MED ORDER — PREDNISONE 50 MG PO TABS
60.0000 mg | ORAL_TABLET | Freq: Once | ORAL | Status: AC
  Administered 2022-09-21: 60 mg via ORAL
  Filled 2022-09-21: qty 1

## 2022-09-21 NOTE — ED Provider Notes (Signed)
[place] EMERGENCY DEPARTMENT AT [facility]  Provider Note  CSN: [id number] Arrival date & time: 09/21/22 0347  History Chief Complaint  Patient presents with   Allergic Reaction    Savannah Alvarez is a 22 y.o. female with history of chronic hives, previously followed by Allergy and taking monthly Xolair injections reports she had been well controlled until the last few days when she has noticed hives particularly on her arms and legs at night. Tonight she woke up to go to the rest room and felt like her tongue was swollen, prompting her ED visit. She denies any trouble breathing or swallowing.    Home Medications Prior to Admission medications   Medication Sig Start Date End Date Taking? Authorizing Provider  predniSONE (STERAPRED UNI-PAK 21 TAB) 10 MG (21) TBPK tablet 10mg  Tabs, 6 day taper. Use as directed 09/21/22  Yes Pollyann Savoy, MD  acetaminophen (TYLENOL) 500 MG tablet Take 500 mg by mouth every 6 (six) hours as needed for moderate pain.    [provider]  EPINEPHrine 0.3 mg/0.3 mL IJ SOAJ injection Inject 0.3 mg into the muscle as needed for anaphylaxis. 05/10/20   [provider]  omalizumab Geoffry Paradise) 75 MG/0.5ML pen Inject 75 mg into the skin every 30 (thirty) days.    [provider]     Allergies    Cantaloupe extract allergy skin test, Fish allergy, Onion, Strawberry (diagnostic), and Tomato   Review of Systems   Review of Systems Please see HPI for pertinent positives and negatives  Physical Exam BP 124/84 (BP Location: Right Arm)   Pulse (!) 101   Temp 97.9 F (36.6 C)   Resp 16   Wt 63.5 kg   LMP 09/14/2022 (Approximate)   SpO2 100%   BMI 25.61 kg/m   Physical Exam Vitals and nursing note reviewed.  Constitutional:      Appearance: Normal appearance.  HENT:     Head: Normocephalic and atraumatic.     Nose: Nose normal.     Mouth/Throat:     Mouth: Mucous membranes are moist.     Comments: No appreciable  angioedema of tongue or lips Eyes:     Extraocular Movements: Extraocular movements intact.     Conjunctiva/sclera: Conjunctivae normal.  Cardiovascular:     Rate and Rhythm: Normal rate.  Pulmonary:     Effort: Pulmonary effort is normal.     Breath sounds: Normal breath sounds.  Abdominal:     General: Abdomen is flat.     Palpations: Abdomen is soft.     Tenderness: There is no abdominal tenderness.  Musculoskeletal:        General: No swelling. Normal range of motion.     Cervical back: Neck supple.  Skin:    General: Skin is warm and dry.     Findings: No rash (no hives).  Neurological:     General: No focal deficit present.     Mental Status: She is alert.  Psychiatric:        Mood and Affect: Mood normal.     ED Results / Procedures / Treatments   EKG None  Procedures Procedures  Medications Ordered in the ED Medications  predniSONE (DELTASONE) tablet 60 mg (has no administration in time range)    Initial Impression and Plan  Patient with chronic hives reports flare up recently, has been taking benadryl at home. Reports hives and tongue swelling at home prior to arrival but exam is normal at the time  of my evaluation. No signs of airway involvement. Will give a course of prednisone. Recommend outpatient Allergy follow up. RTED for any other concerns.   ED Course       MDM Rules/Calculators/A&P Medical Decision Making Problems Addressed: Urticaria: chronic illness or injury with exacerbation, progression, or side effects of treatment  Risk Prescription drug management.     Final Clinical Impression(s) / ED Diagnoses Final diagnoses:  Urticaria    Rx / DC Orders ED Discharge Orders          Ordered    predniSONE (STERAPRED UNI-PAK 21 TAB) 10 MG (21) TBPK tablet        09/21/22 0432             Pollyann Savoy, MD 09/21/22 [phone]

## 2022-09-21 NOTE — ED Notes (Signed)
Reviewed AVS with patient, patient expressed understanding of directions, denies further questions at this time. 

## 2022-09-21 NOTE — ED Triage Notes (Signed)
Pt is treated for chronic hives with allergy injections and benadryl. Woke from sleep tonight with hives and R tongue and R chin swelling.   In triage, voice is clear, breathing easy and unlabored, no drooling, denying SOB or throat swelling.   Has not used an epi pen since middle school.   Last dose benadryl at 1am.

## 2022-10-02 ENCOUNTER — Encounter (HOSPITAL_BASED_OUTPATIENT_CLINIC_OR_DEPARTMENT_OTHER): Payer: Self-pay | Admitting: Emergency Medicine

## 2022-10-02 ENCOUNTER — Other Ambulatory Visit: Payer: Self-pay

## 2022-10-02 ENCOUNTER — Emergency Department (HOSPITAL_BASED_OUTPATIENT_CLINIC_OR_DEPARTMENT_OTHER)
Admission: EM | Admit: 2022-10-02 | Discharge: 2022-10-02 | Disposition: A | Discharge status: Home / Self Care | Payer: Medicaid Other | Source: Home / Self Care

## 2022-10-02 DIAGNOSIS — T7840XA Allergy, unspecified, initial encounter: Secondary | ICD-10-CM

## 2022-10-02 DIAGNOSIS — T781XXA Other adverse food reactions, not elsewhere classified, initial encounter: Secondary | ICD-10-CM | POA: Diagnosis not present

## 2022-10-02 DIAGNOSIS — L500 Allergic urticaria: Secondary | ICD-10-CM | POA: Insufficient documentation

## 2022-10-02 LAB — PREGNANCY, URINE: Preg Test, Ur: NEGATIVE

## 2022-10-02 MED ORDER — EPINEPHRINE 0.3 MG/0.3ML IJ SOAJ
0.3000 mg | Freq: Once | INTRAMUSCULAR | Status: AC
  Administered 2022-10-02: 0.3 mg via INTRAMUSCULAR
  Filled 2022-10-02: qty 0.3

## 2022-10-02 MED ORDER — EPINEPHRINE 0.3 MG/0.3ML IJ SOAJ: 0.3000 mg | INTRAMUSCULAR | 0 refills | Status: DC | PRN

## 2022-10-02 MED ORDER — DIPHENHYDRAMINE HCL 50 MG/ML IJ SOLN
50.0000 mg | Freq: Once | INTRAMUSCULAR | Status: AC
  Administered 2022-10-02: 50 mg via INTRAVENOUS
  Filled 2022-10-02: qty 1

## 2022-10-02 MED ORDER — FAMOTIDINE IN NACL 20-0.9 MG/50ML-% IV SOLN
20.0000 mg | Freq: Once | INTRAVENOUS | Status: AC
  Administered 2022-10-02: 20 mg via INTRAVENOUS
  Filled 2022-10-02: qty 50

## 2022-10-02 MED ORDER — DEXAMETHASONE SODIUM PHOSPHATE 10 MG/ML IJ SOLN
10.0000 mg | Freq: Once | INTRAMUSCULAR | Status: AC
  Administered 2022-10-02: 10 mg via INTRAVENOUS
  Filled 2022-10-02: qty 1

## 2022-10-02 NOTE — Discharge Instructions (Addendum)
Return immediately if you develop fevers, chills, hives, itching, like your tongue is swelling, difficulty breathing, nausea vomiting or any new or worsening symptoms.  Please fill your EpiPen prescription.

## 2022-10-02 NOTE — ED Triage Notes (Signed)
Patient ate popeyes this evening and reports generalized hives and tongue swelling that started shortly after.  Patient reports she's allergic to palm oil.  Patient speaking in complete sentences in triage, in NAD.

## 2022-10-02 NOTE — ED Notes (Signed)
Patient resting quietly in stretcher, respirations even, unlabored, no acute distress noted. Denies needs at this time.  

## 2022-10-02 NOTE — ED Notes (Signed)
Reviewed AVS with patient, patient expressed understanding of directions, denies further questions at this time. 

## 2022-10-03 NOTE — ED Provider Notes (Signed)
[place] EMERGENCY DEPARTMENT AT [facility] Provider Note   CSN: [id number] Arrival date & time: 10/02/22  2001     History  Chief Complaint  Patient presents with   Allergic Reaction    Savannah Alvarez is a 22 y.o. female.  Presented with allergic reaction after having palm oil.  Has had similar reaction in the past.  Did not have her EpiPen with her.  Complains of hives to face, feels that her tongue is starting to swell, some chest tightness and some nausea.   Allergic Reaction      Home Medications Prior to Admission medications   Medication Sig Start Date End Date Taking? Authorizing Provider  EPINEPHrine 0.3 mg/0.3 mL IJ SOAJ injection Inject 0.3 mg into the muscle as needed for anaphylaxis. 10/02/22  Yes Coral Spikes, DO  acetaminophen (TYLENOL) 500 MG tablet Take 500 mg by mouth every 6 (six) hours as needed for moderate pain.    [provider]  EPINEPHrine 0.3 mg/0.3 mL IJ SOAJ injection Inject 0.3 mg into the muscle as needed for anaphylaxis. 05/10/20   [provider]  omalizumab Geoffry Paradise) 75 MG/0.5ML pen Inject 75 mg into the skin every 30 (thirty) days.    [provider]  predniSONE (STERAPRED UNI-PAK 21 TAB) 10 MG (21) TBPK tablet 10mg  Tabs, 6 day taper. Use as directed 09/21/22   Pollyann Savoy, MD      Allergies    Cantaloupe extract allergy skin test, Fish allergy, Onion, Strawberry (diagnostic), and Tomato    Review of Systems   Review of Systems  Physical Exam Updated Vital Signs BP (!) 119/59   Pulse (!) 118   Temp 98.9 F (37.2 C) (Oral)   Resp (!) 21   Wt 65.8 kg   LMP 09/14/2022 (Approximate)   SpO2 100%   BMI 26.52 kg/m  Physical Exam Vitals and nursing note reviewed.  HENT:     Head: Normocephalic.     Comments: Patient with hives all over her face    Nose: Nose normal.     Mouth/Throat:     Mouth: Mucous membranes are moist.     Comments: No angioedema Eyes:     Conjunctiva/sclera:  Conjunctivae normal.  Cardiovascular:     Rate and Rhythm: Normal rate and regular rhythm.  Pulmonary:     Effort: Pulmonary effort is normal.     Breath sounds: Normal breath sounds.  Abdominal:     General: Abdomen is flat. There is no distension.     Palpations: Abdomen is soft.     Tenderness: There is no abdominal tenderness. There is no guarding or rebound.  Musculoskeletal:        General: Normal range of motion.  Skin:    General: Skin is warm and dry.     Capillary Refill: Capillary refill takes less than 2 seconds.  Neurological:     General: No focal deficit present.     Mental Status: She is alert and oriented to person, place, and time.  Psychiatric:        Mood and Affect: Mood normal.        Behavior: Behavior normal.     ED Results / Procedures / Treatments   Labs (all labs ordered are listed, but only abnormal results are displayed) Labs Reviewed  PREGNANCY, URINE    EKG None  Radiology No results found.  Procedures Procedures    Medications Ordered in ED Medications  diphenhydrAMINE (BENADRYL) injection 50 mg (50  mg Intravenous Given 10/02/22 2027)  EPINEPHrine (EPI-PEN) injection 0.3 mg (0.3 mg Intramuscular Given 10/02/22 2026)  dexamethasone (DECADRON) injection 10 mg (10 mg Intravenous Given 10/02/22 2029)  famotidine (PEPCID) IVPB 20 mg premix (0 mg Intravenous Stopped 10/02/22 2115)    ED Course/ Medical Decision Making/ A&P Clinical Course as of 10/03/22 0007  Fri Oct 02, 2022  2013 Patient allergic reaction after eating Popeyes allergic pulmonal.  Has hives all on her face.  Reporting some subjective tongue swelling and some difficulty breathing.  She is nauseated, not vomiting.  No appreciable angioedema on exam.  She does have hives all over her face.  Will give epinephrine, Benadryl, steroid and Pepcid.  Will monitor. [TY]  2048 Reevaluated.  Hives improving.  Reports tongue back to normal.  Chest tightness/shortness of breath improved. [TY]     Clinical Course User Index [TY] Coral Spikes, DO                                 Medical Decision Making 22 year old female presented emergency department for allergic reaction.  Afebrile, hemodynamically stable.  She did have multiple organ functions affected with hives, self-reported angioedema/tingling to her tongue some tightness/shortness of breath with nausea.  She was given IM epinephrine, Decadron, Benadryl and Pepcid.  Symptoms rapidly improved.  Frequent reevaluations with no return of symptoms after observation in the emergency department for close to 2 and half hours.  Shared decision making regarding home versus continued monitoring.  Patient states that she would like to go home.  She does need a prescription for EpiPen.  Will discharge at this time.  Amount and/or Complexity of Data Reviewed Labs: ordered.  Risk Prescription drug management.           Final Clinical Impression(s) / ED Diagnoses Final diagnoses:  Allergic reaction, initial encounter    Rx / DC Orders ED Discharge Orders          Ordered    EPINEPHrine 0.3 mg/0.3 mL IJ SOAJ injection  As needed        10/02/22 2222              Coral Spikes, DO 10/03/22 0007

## 2022-10-13 NOTE — Progress Notes (Deleted)
No chief complaint on file.    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 She was seen in consult with Dr Frances Furbish 01/2022 for acute onset of progressive, bilateral upper and lower ext paresthesias and weakness following hip surgery 11/2021. EMG/NCS with Dr Terrace Arabia 01/2022 confirmed AIDP and she was referred to ER to start IVIG therapy. She was seen in follow up 01/2022 and doing well. Outpatient IVIG started though [facility] 03/16/2022. She was seen in follow up with Dr Terrace Arabia 03/2022 and doing fiarly well but complained of neuropathic pain. She was started on gabapentin and duloxetin. Infusion 04/27/2022. She was seen back for follow up with Dr Terrace Arabia 05/2022. Gabapentin and duloxetine not helping. Repeat EMG showed dramatic improvement, but continued evidence of some demyelinating features, as evident by prolonged peak latency at sensory nerve study, prolonged distal latency at motor conduction study. Three more treatments of IVIG ordered 05/2022.   Since,   10/09/2020 ALL: Savannah Alvarez is a 22 y.o. female here today for follow up for headaches. She was started on gabapentin 100mg  at bedtime at last visit with Dr Frances Furbish 04/2020. Nurtec was continued for abortive therapy and Maxalt added. She was advised to wean OTC analgesics. She reports that headaches continue. She has a headache every day. She reports that she usually has a sharp pain in the center of her forehead. Pain is daily. She has sound and light sensitivity at least every other day. May have throbbing pain if headache is really bad. Pain is not worsened with activity. Rizatriptan did not help much. She is having to take Tylenol and ibuprofen regularly for hip pain. She was seen by PCP last week and given exercises to perform at home. She reports not sleeping well due to pain. She reports drinking water daily but unsure the amount.    HISTORY (copied from Dr Teofilo Pod previous note)  Savannah Alvarez is an  22 year old female with an underlying benign medical history except eczema, who Presents for follow-up consultation of her recurrent headaches.  The patient is accompanied by her mom again today.  I first met her on 02/29/2020 at the request of her primary care nurse practitioner, at which time she reported a recent sports related accident about 2 months prior.  She was diagnosed with a mild concussion.  She had a nonfocal exam, overall felt a little better but had recurrent headaches.  She was not sleeping very well.  She also reported a prior history of intermittent migraines.  I suggested she start low-dose amitriptyline.  She was planning to go back to [place] for college.  Her mother called in the interim reporting that she still has headaches.  We arranged for follow-up appointment since she was not able to see somebody in [place].   Today, 05/15/2020: She reports that she can go a day or 2 without headaches but lately, she has had a daily headache, it is typically in the bifrontal and bitemporal areas, can be constant or throbbing, sometimes she has light sensitivity and nausea, sometimes she does not have nausea.  She stopped taking the amitriptyline earlier this month and noticed a slight worsening of her headache but did not notice any benefit when she was on it and she titrated up to the 2 pills daily.  She has not been doing any cheerleading exercises as she has not been able to participate.  She has been trying to use her eyeglasses consistently and she  has tried to hydrate well with water, tries to rest.  She has been taking over-the-counter Tylenol and Advil, Advil seems to help a little more and has infected taking 2 pills every 2-3 hours on a nearly daily basis.  Her primary care nurse practitioner has provided her with a sample of Nurtec, she used it recently and thought it helped a little bit.    The patient's allergies, current medications, family history, past medical history, past social  history, past surgical history and problem list were reviewed and updated as appropriate.    Previously:   02/29/20: (She) had a fall about 2 months ago.  She fell during tumbling, at her college, in [place]. She fell backwards and hit her head, no loss of consciousness or laceration were sustained, thankfully.  She had seen a school physician, the athletic doctor, and was apparently diagnosed with mild concussion.  She did not have any x-rays or other imaging done.  Mom says that she has amnesia for the moments preceding the fall.  I reviewed your office note from 01/12/20. She was advised to go to the ER. She presented to the emergency room on 01/12/2020 with complaints of headaches, light sensitivity, nausea, emotional lability, dizziness, and memory impairment.  I reviewed the emergency room records.  She had a normal neurological exam, normal vital signs, GCS 15.  She did not receive any treatment in the emergency room or imaging tests, was provided with instructions for cognitive rest and proper stepwise return to sports.  She reports that she still has intermittent headaches, they occur almost every other day.  They are mostly in the frontal areas, right or left side or in the middle.  She has occasional nausea, no vomiting, occasional light sensitivity but not every time.  Overall, she feels a little better.  She has been using Tylenol over-the-counter, 500 mg strength 2 pills twice daily or up to 3 times a day.  Mom believes that she is doing better thankfully.  She has occasional blurry vision.  She does have prescription eyeglasses and had a recent eye examination in August 2021 but not since her head injury.  She denies any sudden onset of one-sided weakness or numbness or tingling or droopy face or slurring of speech or hearing loss or vision loss.  No double vision, only occasional blurry vision.  She denies any significant depression or anxiety.  She is currently not on any prescription medication.   She does not sleep very well.   REVIEW OF SYSTEMS: Out of a complete 14 system review of symptoms, the patient complains only of the following symptoms, headaches, hip pain, insomnia and all other reviewed systems are negative.   ALLERGIES: Allergies  Allergen Reactions   Cantaloupe Extract Allergy Skin Test Swelling   Fish Allergy Hives   Onion Swelling   Strawberry (Diagnostic) Hives   Tomato Swelling     HOME MEDICATIONS: Outpatient Medications Prior to Visit  Medication Sig Dispense Refill   acetaminophen (TYLENOL) 500 MG tablet Take 500 mg by mouth every 6 (six) hours as needed for moderate pain.     EPINEPHrine 0.3 mg/0.3 mL IJ SOAJ injection Inject 0.3 mg into the muscle as needed for anaphylaxis.     EPINEPHrine 0.3 mg/0.3 mL IJ SOAJ injection Inject 0.3 mg into the muscle as needed for anaphylaxis. 1 each 0   omalizumab (XOLAIR) 75 MG/0.5ML pen Inject 75 mg into the skin every 30 (thirty) days.     predniSONE (STERAPRED UNI-PAK 21  TAB) 10 MG (21) TBPK tablet 10mg  Tabs, 6 day taper. Use as directed 1 each 0   No facility-administered medications prior to visit.     PAST MEDICAL HISTORY: Past Medical History:  Diagnosis Date   Allergic urticaria    Anxiety and depression    Eczema    Guillain-Barre syndrome (HCC)    Vitamin D deficiency      PAST SURGICAL HISTORY: Past Surgical History:  Procedure Laterality Date   HIP SURGERY Right      FAMILY HISTORY: Family History  Problem Relation Age of Onset   Hypertension Father    Diabetes Father    Hyperthyroidism Mother    Diabetes Maternal Grandmother    Hypertension Maternal Grandmother    Diabetes Maternal Grandfather    Hypertension Maternal Grandfather      SOCIAL HISTORY: Social History   Socioeconomic History   Marital status: Single    Spouse name: Not on file   Number of children: 0   Years of education: Not on file   Highest education level: Some college, no degree  Occupational History     Comment: student in college  Tobacco Use   Smoking status: Never    Passive exposure: Never   Smokeless tobacco: Never  Vaping Use   Vaping status: Never Used  Substance and Sexual Activity   Alcohol use: Never   Drug use: Never   Sexual activity: Yes    Birth control/protection: None  Other Topics Concern   Not on file  Social History Narrative   Lives at college   1 can of [place] per day   Social Determinants of Health   Financial Resource Strain: Not on file  Food Insecurity: No Food Insecurity (01/29/2022)   Hunger Vital Sign    Worried About Running Out of Food in the Last Year: Never true    Ran Out of Food in the Last Year: Never true  Transportation Needs: No Transportation Needs (01/29/2022)   PRAPARE - Administrator, Civil Service (Medical): No    Lack of Transportation (Non-Medical): No  Physical Activity: Inactive (08/04/2022)   Exercise Vital Sign    Days of Exercise per Week: 0 days    Minutes of Exercise per Session: 0 min  Stress: Not on file  Social Connections: Unknown (07/08/2021)   Received from [facility]   Social Network    Social Network: Not on file  Intimate Partner Violence: Not At Risk (02/01/2022)   Humiliation, Afraid, Rape, and Kick questionnaire    Fear of Current or Ex-Partner: No    Emotionally Abused: No    Physically Abused: No    Sexually Abused: No     PHYSICAL EXAM  There were no vitals filed for this visit.  There is no height or weight on file to calculate BMI.   Generalized: Well developed, in no acute distress  Cardiology: normal rate and rhythm, no murmur auscultated  Respiratory: clear to auscultation bilaterally    Neurological examination  Mentation: Alert oriented to time, place, history taking. Follows all commands speech and language fluent Cranial nerve II-XII: Pupils were equal round reactive to light. Extraocular movements were full, visual field were full on confrontational  test. Facial sensation and strength were normal. Head turning and shoulder shrug  were normal and symmetric. Motor: The motor testing reveals 5 over 5 strength of all 4 extremities. Good symmetric motor tone is noted throughout.  Gait and station: Gait is normal.  DIAGNOSTIC DATA (LABS, IMAGING, TESTING) - I reviewed patient records, labs, notes, testing and imaging myself where available.  Lab Results  Component Value Date   WBC 6.3 06/03/2022   HGB 11.6 (L) 06/03/2022   HCT 37.1 06/03/2022   MCV 80.8 06/03/2022   PLT 461 (H) 06/03/2022      Component Value Date/Time   NA 133 (L) 06/03/2022 1620   NA 140 02/05/2022 1308   K 3.5 06/03/2022 1620   CL 102 06/03/2022 1620   CO2 22 06/03/2022 1620   GLUCOSE 80 06/03/2022 1620   BUN 15 06/03/2022 1620   BUN 14 02/05/2022 1308   CREATININE 0.74 06/03/2022 1620   CALCIUM 9.2 06/03/2022 1620   PROT 7.8 06/03/2022 1620   PROT 9.0 (H) 02/05/2022 1308   ALBUMIN 3.7 06/03/2022 1620   ALBUMIN 4.1 02/05/2022 1308   AST 23 06/03/2022 1620   ALT 18 06/03/2022 1620   ALKPHOS 54 06/03/2022 1620   BILITOT 0.8 06/03/2022 1620   BILITOT 0.3 02/05/2022 1308   GFRNONAA >60 06/03/2022 1620   No results found for: "CHOL", "HDL", "LDLCALC", "LDLDIRECT", "TRIG", "CHOLHDL" Lab Results  Component Value Date   HGBA1C 5.1 01/27/2022   Lab Results  Component Value Date   VITAMINB12 500 05/22/2022   Lab Results  Component Value Date   TSH 1.072 05/22/2022        No data to display               No data to display           ASSESSMENT AND PLAN  22 y.o. year old female  has a past medical history of Allergic urticaria, Anxiety and depression, Eczema, Guillain-Barre syndrome (HCC), and Vitamin D deficiency. here with    No diagnosis found.  Savannah Alvarez reports that headaches have decreased in severity, however, she continues to have daily headaches. Headaches sound of mixed type. We have discussed most common triggers for  headaches. She was encouraged to wean daily analgesics. She will continue discussion with PCP regarding hip pain. We will increase gabapentin to up to 300mg  daily. She may take 100mg  BID-TID or take 300mg  at bedtime. We reviewed pros and cons of each regimen. Her mother is present with her today and expresses understanding of dosing schedule. Savannah Alvarez was encouraged to ensure she is drinking at least 50-60 ounces of water every day. Regular well balanced meals and exercise also encouraged. She will consider a headache journal to assist with trigger identification and response to medications. She will follow up with me in 3 months, sooner if needed. She verbalizes understanding and agreement with this plan.   No orders of the defined types were placed in this encounter.     No orders of the defined types were placed in this encounter.      Shawnie Dapper, MSN, FNP-C 10/13/2022, 3:54 PM  [facility] [address], [address] [place], [place] [address] [phone]

## 2022-10-14 ENCOUNTER — Encounter: Payer: Self-pay | Admitting: Family Medicine

## 2022-10-14 ENCOUNTER — Ambulatory Visit: Payer: Medicaid Other | Admitting: Family Medicine

## 2022-10-14 DIAGNOSIS — G610 Guillain-Barre syndrome: Secondary | ICD-10-CM

## 2022-10-27 ENCOUNTER — Other Ambulatory Visit: Payer: Self-pay

## 2022-10-27 ENCOUNTER — Encounter (HOSPITAL_BASED_OUTPATIENT_CLINIC_OR_DEPARTMENT_OTHER): Payer: Self-pay

## 2022-10-27 ENCOUNTER — Emergency Department (HOSPITAL_BASED_OUTPATIENT_CLINIC_OR_DEPARTMENT_OTHER)
Admission: EM | Admit: 2022-10-27 | Discharge: 2022-10-27 | Disposition: A | Discharge status: Home / Self Care | Payer: Medicaid Other | Attending: Emergency Medicine | Admitting: Emergency Medicine

## 2022-10-27 DIAGNOSIS — L509 Urticaria, unspecified: Secondary | ICD-10-CM | POA: Insufficient documentation

## 2022-10-27 MED ORDER — DEXAMETHASONE SODIUM PHOSPHATE 10 MG/ML IJ SOLN
10.0000 mg | Freq: Once | INTRAMUSCULAR | Status: AC
  Administered 2022-10-27: 10 mg via INTRAMUSCULAR
  Filled 2022-10-27: qty 1

## 2022-10-27 NOTE — Discharge Instructions (Signed)
You were evaluated in the Emergency Department and after careful evaluation, we did not find any emergent condition requiring admission or further testing in the hospital.  Your exam/testing today was overall reassuring.  Continue Benadryl and/or Pepcid for hives, follow-up with a dermatologist and an allergist.  Please return to the Emergency Department if you experience any worsening of your condition.  Thank you for allowing [place] to be a part of your care.

## 2022-10-27 NOTE — ED Triage Notes (Signed)
Pt arrived POV for ongoing hives for over a week, seen PCP last week for same and was prescribe cetirizine and Zantac w/o improvement . Pt also tried OTC Allegra w/o relief, reports benadryl does not work as well. Pt with hives to bilateral upper and lower extremities, denies SOB or difficulty swallowing. VSS, NAD noted.

## 2022-10-27 NOTE — ED Provider Notes (Signed)
[facility] EMERGENCY [facility] Emergency Department Provider Note MRN:  [id number]  Arrival date & time: 10/27/22     Chief Complaint   Urticaria   History of Present Illness   Savannah Alvarez is a 22 y.o. year-old female with no pertinent past medical history presenting to the ED with chief complaint of urticaria.  Persistent urticaria for the past few months.  Has numerous food allergies but does not think she has had any of these allergies recently.  Return of diffuse hives this evening.  The hives really have not gone fully away over the past few months, sometimes they get worse, sometimes they get better.  Not having any throat closing sensation or trouble breathing or any other complaints.  Review of Systems  A thorough review of systems was obtained and all systems are negative except as noted in the HPI and PMH.   Patient's Health History    Past Medical History:  Diagnosis Date   Allergic urticaria    Anxiety and depression    Eczema    Guillain-Barre syndrome (HCC)    Vitamin D deficiency     Past Surgical History:  Procedure Laterality Date   HIP SURGERY Right     Family History  Problem Relation Age of Onset   Hypertension Father    Diabetes Father    Hyperthyroidism Mother    Diabetes Maternal Grandmother    Hypertension Maternal Grandmother    Diabetes Maternal Grandfather    Hypertension Maternal Grandfather     Social History   Socioeconomic History   Marital status: Single    Spouse name: Not on file   Number of children: 0   Years of education: Not on file   Highest education level: Some college, no degree  Occupational History    Comment: student in college  Tobacco Use   Smoking status: Never    Passive exposure: Never   Smokeless tobacco: Never  Vaping Use   Vaping status: Never Used  Substance and Sexual Activity   Alcohol use: Never   Drug use: Never   Sexual activity: Yes    Birth control/protection: None  Other Topics Concern    Not on file  Social History Narrative   Lives at college   1 can of [place] per day   Social Determinants of Health   Financial Resource Strain: Not on file  Food Insecurity: No Food Insecurity (01/29/2022)   Hunger Vital Sign    Worried About Running Out of Food in the Last Year: Never true    Ran Out of Food in the Last Year: Never true  Transportation Needs: No Transportation Needs (01/29/2022)   PRAPARE - Administrator, Civil Service (Medical): No    Lack of Transportation (Non-Medical): No  Physical Activity: Inactive (08/04/2022)   Exercise Vital Sign    Days of Exercise per Week: 0 days    Minutes of Exercise per Session: 0 min  Stress: Not on file  Social Connections: Unknown (07/08/2021)   Received from [facility]   Social Network    Social Network: Not on file  Intimate Partner Violence: Not At Risk (02/01/2022)   Humiliation, Afraid, Rape, and Kick questionnaire    Fear of Current or Ex-Partner: No    Emotionally Abused: No    Physically Abused: No    Sexually Abused: No     Physical Exam   Vitals:   10/27/22 0545  BP: 120/69  Pulse: (!) 104  Resp:  17  Temp: 98.8 F (37.1 C)  SpO2: 100%    CONSTITUTIONAL: Well-appearing, NAD NEURO/PSYCH:  Alert and oriented x 3, no focal deficits EYES:  eyes equal and reactive ENT/NECK:  no LAD, no JVD CARDIO: Regular rate, well-perfused, normal S1 and S2 PULM:  CTAB no wheezing or rhonchi GI/GU:  non-distended, non-tender MSK/SPINE:  No gross deformities, no edema SKIN: Diffuse hives   *Additional and/or pertinent findings included in MDM below  Diagnostic and Interventional Summary    EKG Interpretation Date/Time:    Ventricular Rate:    PR Interval:    QRS Duration:    QT Interval:    QTC Calculation:   R Axis:      Text Interpretation:         Labs Reviewed - No data to display  No orders to display    Medications  dexamethasone (DECADRON) injection 10 mg (has no  administration in time range)     Procedures  /  Critical Care Procedures  ED Course and Medical Decision Making  Initial Impression and Ddx Hives of unclear cause, possibly allergic, other consideration is a dermatologic condition.  The fact that the hives have not gone away completely would suggest may be more of a dermatologic cause, though patient does have a number of food allergies.  No signs of anaphylaxis or angioedema.  Past medical/surgical history that increases complexity of ED encounter: Numerous food allergies  Interpretation of Diagnostics Laboratory and/or imaging options to aid in the diagnosis/care of the patient were considered.  After careful history and physical examination, it was determined that there was no indication for diagnostics at this time.  Patient Reassessment and Ultimate Disposition/Management     Discharge as there is no emergent process evident.  Patient management required discussion with the following services or consulting groups:  None  Complexity of Problems Addressed Acute complicated illness or Injury  Additional Data Reviewed and Analyzed Further history obtained from: Prior ED visit notes  Additional Factors Impacting ED Encounter Risk None  Elmer Sow. Pilar Plate, MD [facility] Emergency Medicine [facility] [email or web address]  Final Clinical Impressions(s) / ED Diagnoses     ICD-10-CM   1. Hives  L50.9       ED Discharge Orders     None        Discharge Instructions Discussed with and Provided to Patient:    Discharge Instructions      You were evaluated in the Emergency Department and after careful evaluation, we did not find any emergent condition requiring admission or further testing in the hospital.  Your exam/testing today was overall reassuring.  Continue Benadryl and/or Pepcid for hives, follow-up with a dermatologist and an allergist.  Please return to the Emergency Department if you  experience any worsening of your condition.  Thank you for allowing [place] to be a part of your care.       Sabas Sous, MD 10/27/22 [phone]

## 2022-10-28 ENCOUNTER — Encounter (HOSPITAL_BASED_OUTPATIENT_CLINIC_OR_DEPARTMENT_OTHER): Payer: Self-pay | Admitting: Emergency Medicine

## 2022-10-28 ENCOUNTER — Other Ambulatory Visit: Payer: Self-pay

## 2022-10-28 ENCOUNTER — Emergency Department (HOSPITAL_BASED_OUTPATIENT_CLINIC_OR_DEPARTMENT_OTHER)
Admission: EM | Admit: 2022-10-28 | Discharge: 2022-10-29 | Disposition: A | Discharge status: Home / Self Care | Payer: Medicaid Other | Attending: Emergency Medicine | Admitting: Emergency Medicine

## 2022-10-28 DIAGNOSIS — L509 Urticaria, unspecified: Secondary | ICD-10-CM | POA: Insufficient documentation

## 2022-10-28 MED ORDER — HYDROXYZINE HCL 25 MG PO TABS: 25.0000 mg | ORAL_TABLET | Freq: Four times a day (QID) | ORAL | 0 refills | Status: DC | PRN

## 2022-10-28 MED ORDER — DEXAMETHASONE SODIUM PHOSPHATE 10 MG/ML IJ SOLN
6.0000 mg | Freq: Once | INTRAMUSCULAR | Status: AC
  Administered 2022-10-28: 6 mg via INTRAMUSCULAR
  Filled 2022-10-28: qty 1

## 2022-10-28 NOTE — Discharge Instructions (Addendum)
As discussed, I have sent a prescription of Vistaril to your pharmacy. You can take this up to 4x a day as needed for itching. You can also try OTC calamine lotion for itching. I have sent a referral to a new allergist. They should give you a call in the next 2 days to schedule an appointment.  You can follow-up with your PCP in the interim.  Get help right away if: Your tongue, lips, or eyelids swell. Your chest or throat feels tight. You have trouble breathing or swallowing.

## 2022-10-28 NOTE — ED Triage Notes (Signed)
Pt in with worsening of hives, seen this am in the ED and pt states she has been taking her prescribed Pepcid, and last Benadryl 2hrs ago. Hives present to torso, groin and face. Denies any sob or trouble swallowing

## 2022-10-29 NOTE — ED Provider Notes (Signed)
[name] EMERGENCY DEPARTMENT AT [facility] Provider Note   CSN: [id number] Arrival date & time: 10/28/22  1918     History  Chief Complaint  Patient presents with   Urticaria    Savannah Alvarez is a 22 y.o. female with a history of acute inflammatory demyelinating polyneuropathy, Katheran Awe syndrome, and chronic urticaria presents to the ED today for worsening hives.  Patient was seen yesterday and was given IM Decadron.  Patient reports that she had relief for about 24 hours and then the hives returned and were more itchy than before.  She is supposed to follow-up with an allergist but her appointment got moved back to November and she has not been able to get in contact with since.  Patient has been taking Benadryl every 6 hours without relief of symptoms.  She has also tried over-the-counter antihistamines which have not helped as well as famotidine.  She states that topical hydrocortisone only makes itching worse.  Patient denies shortness of breath, angioedema, or throat tightness/swelling.  No additional concerns or complaints at this time.    Home Medications Prior to Admission medications   Medication Sig Start Date End Date Taking? Authorizing Provider  hydrOXYzine (ATARAX) 25 MG tablet Take 1 tablet (25 mg total) by mouth every 6 (six) hours as needed for itching. 10/28/22 11/27/22 Yes Maxwell Marion, PA-C  acetaminophen (TYLENOL) 500 MG tablet Take 500 mg by mouth every 6 (six) hours as needed for moderate pain.    [provider]  EPINEPHrine 0.3 mg/0.3 mL IJ SOAJ injection Inject 0.3 mg into the muscle as needed for anaphylaxis. 05/10/20   [provider]  EPINEPHrine 0.3 mg/0.3 mL IJ SOAJ injection Inject 0.3 mg into the muscle as needed for anaphylaxis. 10/02/22   Coral Spikes, DO  omalizumab Geoffry Paradise) 75 MG/0.5ML pen Inject 75 mg into the skin every 30 (thirty) days.    [provider]  predniSONE (STERAPRED UNI-PAK 21 TAB) 10 MG (21) TBPK  tablet 10mg  Tabs, 6 day taper. Use as directed 09/21/22   Pollyann Savoy, MD      Allergies    Cantaloupe extract allergy skin test, Fish allergy, Onion, Strawberry (diagnostic), and Tomato    Review of Systems   Review of Systems  Skin:  Positive for rash.  All other systems reviewed and are negative.   Physical Exam Updated Vital Signs BP 109/60   Pulse 100   Temp 99 F (37.2 C) (Oral)   Resp 17   Ht 5\' 2"  (1.575 m)   Wt 70.8 kg   LMP 09/30/2022 (Approximate)   SpO2 100%   BMI 28.53 kg/m  Physical Exam Vitals and nursing note reviewed.  Constitutional:      General: She is not in acute distress.    Appearance: Normal appearance.  HENT:     Head: Normocephalic and atraumatic.     Mouth/Throat:     Mouth: Mucous membranes are moist.  Eyes:     Conjunctiva/sclera: Conjunctivae normal.     Pupils: Pupils are equal, round, and reactive to light.  Cardiovascular:     Rate and Rhythm: Normal rate and regular rhythm.     Pulses: Normal pulses.  Pulmonary:     Effort: Pulmonary effort is normal.     Breath sounds: Normal breath sounds.  Abdominal:     Palpations: Abdomen is soft.     Tenderness: There is no abdominal tenderness.  Skin:    General: Skin is warm and dry.  Findings: Rash present.     Comments: Raised and erythematous patches presents on patient's thighs, arms, chest, neck, and back.  Neurological:     General: No focal deficit present.     Mental Status: She is alert.     Motor: No weakness.  Psychiatric:        Mood and Affect: Mood normal.        Behavior: Behavior normal.     ED Results / Procedures / Treatments   Labs (all labs ordered are listed, but only abnormal results are displayed) Labs Reviewed - No data to display  EKG None  Radiology No results found.  Procedures Procedures: not indicated.   Medications Ordered in ED Medications  dexamethasone (DECADRON) injection 6 mg (6 mg Intramuscular Given 10/28/22 2333)     ED Course/ Medical Decision Making/ A&P                                 Medical Decision Making Risk Prescription drug management.   This patient presents to the ED for concern of hives, this involves an extensive number of treatment options, and is a complaint that carries with it a high risk of complications and morbidity.   Differential diagnosis includes: Contact dermatitis, atopic dermatitis, drug eruption, food sensitivity, etc.   Comorbidities  See HPI above   Additional History  Additional history obtained from previous ED records.   Problem List / ED Course / Critical Interventions / Medication Management  Urticaria I ordered medications including: Decadron for urticaria Medication given prior to discharge I have reviewed the patients home medicines and have made adjustments as needed   Social Determinants of Health  Access to healthcare   Test / Admission - Considered  Patient is hemodynamically stable and safe for discharge home. Prescription for Hydroxyzine sent to the pharmacy. Ambulatory referral to allergist. Return precautions provided.       Final Clinical Impression(s) / ED Diagnoses Final diagnoses:  Urticaria    Rx / DC Orders ED Discharge Orders          Ordered    hydrOXYzine (ATARAX) 25 MG tablet  Every 6 hours PRN        10/28/22 2327    Ambulatory referral to Allergy        10/28/22 2330              Maxwell Marion, PA-C 10/29/22 0013    Cathren Laine, MD 10/29/22 1750

## 2022-10-29 NOTE — ED Notes (Signed)
 RN reviewed discharge instructions with pt. Pt verbalized understanding and had no further questions. VSS upon discharge.  

## 2022-11-03 ENCOUNTER — Encounter (HOSPITAL_BASED_OUTPATIENT_CLINIC_OR_DEPARTMENT_OTHER): Payer: Self-pay | Admitting: Emergency Medicine

## 2022-11-03 ENCOUNTER — Emergency Department (HOSPITAL_BASED_OUTPATIENT_CLINIC_OR_DEPARTMENT_OTHER)
Admission: EM | Admit: 2022-11-03 | Discharge: 2022-11-03 | Discharge status: Against Medical Advice | Payer: Medicaid Other | Attending: Emergency Medicine | Admitting: Emergency Medicine

## 2022-11-03 ENCOUNTER — Other Ambulatory Visit: Payer: Self-pay

## 2022-11-03 DIAGNOSIS — L509 Urticaria, unspecified: Secondary | ICD-10-CM | POA: Insufficient documentation

## 2022-11-03 DIAGNOSIS — Z5321 Procedure and treatment not carried out due to patient leaving prior to being seen by health care provider: Secondary | ICD-10-CM | POA: Diagnosis not present

## 2022-11-03 NOTE — ED Triage Notes (Signed)
Pt arrives to ED with c/o on-going hives. Pt w/ hx of chronic urticaria. No SOB/throat swelling. Was seen for same on 9/3 & 9/4.

## 2022-11-04 ENCOUNTER — Emergency Department (HOSPITAL_BASED_OUTPATIENT_CLINIC_OR_DEPARTMENT_OTHER)
Admission: EM | Admit: 2022-11-04 | Discharge: 2022-11-05 | Disposition: A | Discharge status: Home / Self Care | Payer: Medicaid Other | Attending: Emergency Medicine | Admitting: Emergency Medicine

## 2022-11-04 ENCOUNTER — Encounter (HOSPITAL_BASED_OUTPATIENT_CLINIC_OR_DEPARTMENT_OTHER): Payer: Self-pay | Admitting: Emergency Medicine

## 2022-11-04 ENCOUNTER — Other Ambulatory Visit: Payer: Self-pay

## 2022-11-04 DIAGNOSIS — R21 Rash and other nonspecific skin eruption: Secondary | ICD-10-CM | POA: Insufficient documentation

## 2022-11-04 DIAGNOSIS — R220 Localized swelling, mass and lump, head: Secondary | ICD-10-CM | POA: Insufficient documentation

## 2022-11-04 MED ORDER — METHYLPREDNISOLONE SODIUM SUCC 125 MG IJ SOLR
125.0000 mg | Freq: Once | INTRAMUSCULAR | Status: AC
  Administered 2022-11-04: 125 mg via INTRAVENOUS
  Filled 2022-11-04: qty 2

## 2022-11-04 MED ORDER — SODIUM CHLORIDE 0.9 % IV BOLUS
1000.0000 mL | Freq: Once | INTRAVENOUS | Status: AC
  Administered 2022-11-04: 1000 mL via INTRAVENOUS

## 2022-11-04 MED ORDER — EPINEPHRINE 0.3 MG/0.3ML IJ SOAJ
0.3000 mg | Freq: Once | INTRAMUSCULAR | Status: AC
  Administered 2022-11-04: 0.3 mg via INTRAMUSCULAR
  Filled 2022-11-04: qty 0.3

## 2022-11-04 NOTE — ED Triage Notes (Signed)
Tongue swelling started 1 hour ago. Took benadryl No relief. Some sob and "starting to get hard to swallow" Lips swelling Unknown allergic contact

## 2022-11-04 NOTE — ED Provider Notes (Signed)
[place] EMERGENCY DEPARTMENT AT [facility] Provider Note   CSN: [id number] Arrival date & time: 11/04/22  2126     History  Chief Complaint  Patient presents with   Angioedema    Savannah Alvarez is a 22 y.o. female.  The history is provided by the patient and medical records. No language interpreter was used.  Allergic Reaction Presenting symptoms: difficulty swallowing, rash and swelling   Presenting symptoms: no wheezing   Severity:  Moderate Duration:  3 hours Prior allergic episodes:  Unable to specify Relieved by:  Nothing Worsened by:  Nothing Ineffective treatments:  Antihistamines      Home Medications Prior to Admission medications   Medication Sig Start Date End Date Taking? Authorizing Provider  acetaminophen (TYLENOL) 500 MG tablet Take 500 mg by mouth every 6 (six) hours as needed for moderate pain.    [provider]  EPINEPHrine 0.3 mg/0.3 mL IJ SOAJ injection Inject 0.3 mg into the muscle as needed for anaphylaxis. 05/10/20   [provider]  EPINEPHrine 0.3 mg/0.3 mL IJ SOAJ injection Inject 0.3 mg into the muscle as needed for anaphylaxis. 10/02/22   Coral Spikes, DO  hydrOXYzine (ATARAX) 25 MG tablet Take 1 tablet (25 mg total) by mouth every 6 (six) hours as needed for itching. 10/28/22 11/27/22  Maxwell Marion, PA-C  omalizumab Geoffry Paradise) 75 MG/0.5ML pen Inject 75 mg into the skin every 30 (thirty) days.    [provider]  predniSONE (STERAPRED UNI-PAK 21 TAB) 10 MG (21) TBPK tablet 10mg  Tabs, 6 day taper. Use as directed 09/21/22   Pollyann Savoy, MD      Allergies    Vegetable oil, Cantaloupe extract allergy skin test, Fish allergy, Onion, Strawberry (diagnostic), Strawberry extract, and Tomato    Review of Systems   Review of Systems  Constitutional:  Negative for chills, fatigue and fever.  HENT:  Positive for facial swelling and trouble swallowing. Negative for congestion, mouth sores, rhinorrhea and voice  change.   Eyes:  Negative for visual disturbance.  Respiratory:  Negative for cough, choking, chest tightness, shortness of breath (not now) and wheezing.   Cardiovascular:  Negative for chest pain and palpitations.  Gastrointestinal:  Negative for abdominal pain, constipation, diarrhea, nausea and vomiting.  Genitourinary:  Negative for dysuria.  Musculoskeletal:  Negative for back pain.  Skin:  Positive for rash.  Neurological:  Negative for speech difficulty, weakness, light-headedness and headaches.  Psychiatric/Behavioral:  Negative for agitation and confusion.   All other systems reviewed and are negative.   Physical Exam Updated Vital Signs BP (!) 109/59   Pulse (!) 110   Temp (!) 103.2 F (39.6 C) (Oral)   Resp 18   Ht 5\' 2"  (1.575 m)   Wt 68 kg   LMP 11/02/2022 (Approximate)   SpO2 97%   BMI 27.44 kg/m  Physical Exam Vitals and nursing note reviewed.  Constitutional:      General: She is not in acute distress.    Appearance: She is well-developed. She is not ill-appearing, toxic-appearing or diaphoretic.  HENT:     Head: Atraumatic.     Comments: Patient has lower lip swelling and has some sensation of posterior tongue swelling.  No evidence of PTA or RPA.  No other facial swelling seen aside from the lip.  Some urticarial rash on neck.    Nose: No congestion or rhinorrhea.     Mouth/Throat:     Mouth: Mucous membranes are moist. No injury.  Tongue: Tongue does not deviate from midline.     Palate: No lesions.     Pharynx: No oropharyngeal exudate, posterior oropharyngeal erythema or uvula swelling.  Eyes:     Extraocular Movements: Extraocular movements intact.     Conjunctiva/sclera: Conjunctivae normal.     Pupils: Pupils are equal, round, and reactive to light.  Cardiovascular:     Rate and Rhythm: Normal rate and regular rhythm.     Heart sounds: No murmur heard. Pulmonary:     Effort: Pulmonary effort is normal. No respiratory distress.     Breath  sounds: Normal breath sounds. No wheezing, rhonchi or rales.  Chest:     Chest wall: No tenderness.  Abdominal:     General: Abdomen is flat.     Palpations: Abdomen is soft.     Tenderness: There is no abdominal tenderness. There is no right CVA tenderness, left CVA tenderness, guarding or rebound.  Musculoskeletal:        General: No swelling or tenderness.     Cervical back: Neck supple. No tenderness.  Skin:    General: Skin is warm and dry.     Capillary Refill: Capillary refill takes less than 2 seconds.     Findings: Rash present.  Neurological:     General: No focal deficit present.     Mental Status: She is alert.     Sensory: No sensory deficit.  Psychiatric:        Mood and Affect: Mood normal.     ED Results / Procedures / Treatments   Labs (all labs ordered are listed, but only abnormal results are displayed) Labs Reviewed  PREGNANCY, URINE    EKG None  Radiology No results found.  Procedures Procedures    CRITICAL CARE Performed by: Canary Brim Deric Bocock Total critical care time: 20 minutes Critical care time was exclusive of separately billable procedures and treating other patients. Critical care was necessary to treat or prevent imminent or life-threatening deterioration. Critical care was time spent personally by me on the following activities: development of treatment plan with patient and/or surrogate as well as nursing, discussions with consultants, evaluation of patient's response to treatment, examination of patient, obtaining history from patient or surrogate, ordering and performing treatments and interventions, ordering and review of laboratory studies, ordering and review of radiographic studies, pulse oximetry and re-evaluation of patient's condition.  Medications Ordered in ED Medications  methylPREDNISolone sodium succinate (SOLU-MEDROL) 125 mg/2 mL injection 125 mg (125 mg Intravenous Given 11/04/22 2317)  EPINEPHrine (EPI-PEN) injection  0.3 mg (0.3 mg Intramuscular Given 11/04/22 2316)  sodium chloride 0.9 % bolus 1,000 mL (1,000 mLs Intravenous New Bag/Given 11/04/22 2315)    ED Course/ Medical Decision Making/ A&P                                 Medical Decision Making Amount and/or Complexity of Data Reviewed Labs: ordered.  Risk Prescription drug management.    Savannah Alvarez is a 22 y.o. female with a complex past medical history including Guillain-Barr syndrome, acute inflammatory demyelinating polyneuropathy, chronic intermittent paresthesias, and chronic allergic urticarial reactions who presents with several hours of tongue swelling sensation, lip swelling, and some intermittent difficulty swallowing.  She reports that she had a tickle in her throat yesterday but denies significant sore throat.  She said that over the last few hours she felt the back of her tongue swelling and her lips were  swollen.  She reports the lip swelling has improved slightly but she still has some swelling there but her tongue still feels swollen.  She does not difficulty breathing but she is worried it could progress to that.  She has had to get epi in the past for different oropharyngeal swelling.  She said that she has been on and off steroids for different urticarial reactions and is scheduled to see a specialist next week to help figure out all of these symptoms.  She reports she chronically has intermittent numbness in extremities and had some left leg numbness today but has been similar to prior.  No new headache, neck pain or back pain.  She denies any chest pain, focal shortness of breath in her chest, nausea, vomiting, constipation, or diarrhea.  She does report some rash with urticaria around her neck that comes and goes as well.  She reports she took some Benadryl and some hydroxyzine when it began several hours ago but it did not seem to help.  On exam, lungs were clear with no wheezing.  Chest nontender.  Abdomen nontender.  Mild  numbness in left leg compared to right but otherwise no weakness.  Intact sensation and strength in upper extremities.  Symmetric smile.  She does have some swelling in her lower lip and subjectively feels that her tongue is swollen.  She did not have posterior oropharyngeal erythema that I could see and uvula is midline.  No evidence of PTA or RPA initially and there was no stridor on exam.  She had no difficulty swallowing on my exam.  Oral temperature was 99 when I checked it.  We had a shared decision-making conversation about management.  As she already took antihistamines before coming here, we do think we need to give her some medicines given the persistent posterior tongue swelling sensation.  Will give her epi and will give her some IV steroids.  Will give her some fluids as well and monitor her for several hours.  Patient just was able to get to that appointment on Tuesday with the specialist in [facility] to help determine ongoing plan.  If she has improvement in symptoms and appears well, anticipate discharge with a burst of steroids to see her team next week.  If symptoms were to exacerbate, change, or worsen, anticipate she may need admission for this complex reaction with the tongue and lip swelling that did not improve with medications.  Clearly transferred to oncoming team to await reassessment.          Final Clinical Impression(s) / ED Diagnoses Final diagnoses:  Lip swelling  Tongue swelling     Clinical Impression: 1. Lip swelling   2. Tongue swelling     Disposition: Admit  This note was prepared with assistance of Dragon voice recognition software. Occasional wrong-word or sound-a-like substitutions may have occurred due to the inherent limitations of voice recognition software.     Nylia Gavina, Canary Brim, MD 11/04/22 [phone]

## 2022-11-04 NOTE — ED Notes (Signed)
Introduced self to patient. Patient is in hospital gown, placed on cardiac monitor, updated vitals signs obtained, side rails are up, stretcher lowered and in the locked position, call light within the patients reach and instructed to use if they require assistance or have questions. Both EDP and patient has been updated on the status of their care.

## 2022-11-05 LAB — PREGNANCY, URINE: Preg Test, Ur: NEGATIVE

## 2022-11-05 MED ORDER — PREDNISONE 10 MG PO TABS: 20.0000 mg | ORAL_TABLET | Freq: Two times a day (BID) | ORAL | 0 refills | Status: DC

## 2022-11-05 NOTE — ED Notes (Signed)
 RN reviewed discharge instructions with pt. Pt verbalized understanding and had no further questions. VSS upon discharge.  

## 2022-11-05 NOTE — Discharge Instructions (Signed)
Begin taking prednisone as prescribed.  Take Benadryl 25 mg every 6 hours for the next 2 days.  Follow-up with your physician as previously scheduled, and return to the ER if symptoms significantly worsen or change.

## 2022-11-05 NOTE — ED Provider Notes (Signed)
  Physical Exam  BP (!) 109/59   Pulse (!) 110   Temp 99.7 F (37.6 C) (Oral)   Resp 18   Ht 5\' 2"  (1.575 m)   Wt 68 kg   LMP 11/02/2022 (Approximate)   SpO2 97%   BMI 27.44 kg/m   Physical Exam Vitals and nursing note reviewed.  Constitutional:      Appearance: Normal appearance.  HENT:     Head: Normocephalic and atraumatic.     Mouth/Throat:     Mouth: Mucous membranes are moist.     Pharynx: No oropharyngeal exudate.  Cardiovascular:     Rate and Rhythm: Normal rate.  Musculoskeletal:     Cervical back: Normal range of motion and neck supple.  Skin:    General: Skin is warm and dry.  Neurological:     Mental Status: She is alert and oriented to person, place, and time.     Procedures  Procedures  ED Course / MDM    Medical Decision Making Amount and/or Complexity of Data Reviewed Labs: ordered.  Risk Prescription drug management.    Care assumed from Dr. Rush Landmark at shift change.  Care signed out to me awaiting reassessment after receiving epi, steroids, and Benadryl.  Patient with history of allergic reactions/possible angioedema presenting here with a recurrence of this.  Patient has been reassessed and is feeling markedly improved.  She is in no respiratory distress and I appreciate no significant oral swelling.  I feel as though she can safely be discharged with outpatient follow-up at [facility] as previously arranged.      Geoffery Lyons, MD 11/05/22 [phone]

## 2022-11-06 ENCOUNTER — Encounter (HOSPITAL_BASED_OUTPATIENT_CLINIC_OR_DEPARTMENT_OTHER): Payer: Self-pay

## 2022-11-06 ENCOUNTER — Emergency Department (HOSPITAL_BASED_OUTPATIENT_CLINIC_OR_DEPARTMENT_OTHER)
Admission: EM | Admit: 2022-11-06 | Discharge: 2022-11-06 | Disposition: A | Discharge status: Home / Self Care | Payer: Medicaid Other | Attending: Emergency Medicine | Admitting: Emergency Medicine

## 2022-11-06 ENCOUNTER — Other Ambulatory Visit: Payer: Self-pay

## 2022-11-06 DIAGNOSIS — T7840XD Allergy, unspecified, subsequent encounter: Secondary | ICD-10-CM | POA: Diagnosis present

## 2022-11-06 MED ORDER — FAMOTIDINE 20 MG PO TABS: 20.0000 mg | ORAL_TABLET | Freq: Every day | ORAL | Status: DC

## 2022-11-06 MED ORDER — EPINEPHRINE 0.3 MG/0.3ML IJ SOAJ: 0.3000 mg | INTRAMUSCULAR | 1 refills | Status: DC | PRN

## 2022-11-06 MED ORDER — FAMOTIDINE 20 MG PO TABS
40.0000 mg | ORAL_TABLET | Freq: Once | ORAL | Status: AC
  Administered 2022-11-06: 40 mg via ORAL
  Filled 2022-11-06: qty 2

## 2022-11-06 NOTE — ED Triage Notes (Signed)
Tongue swelling since 2am, took benadryl of unknown amount and prednisone @ 0240. Pt sts that back of tongue feels more swollen but she's controlling secretions, swallowing normally but sts speech has a lisp that isn't normally there. A&O x 4, amb to room

## 2022-11-06 NOTE — ED Notes (Signed)
Pt given discharge instructions and reviewed prescriptions. Opportunities given for questions. Pt verbalizes understanding. Jillyn Hidden, RN

## 2022-11-06 NOTE — ED Provider Notes (Signed)
[place] EMERGENCY DEPARTMENT AT [facility] Provider Note  CSN: [id number] Arrival date & time: 11/06/22 0440  Chief Complaint(s) Allergic Reaction  HPI Savannah Alvarez is a 22 y.o. female with a past medical history listed below including recent angioedema.  Patient was seen yesterday for lip and tongue swelling.  She required epinephrine and was discharged home after several hours of monitoring.  She has been taken Benadryl and steroids.  Reports that around 2 AM this morning, she was awakened from sleep due to tongue swelling.  No associated shortness of breath or trouble swallowing.  She did develop urticaria on her abdomen.  No nausea or vomiting.  Patient took 25 mg of Benadryl prior to arrival which appears to be improving the swelling.  The history is provided by the patient.    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 Authorizing Provider  EPINEPHrine 0.3 mg/0.3 mL IJ SOAJ injection Inject 0.3 mg into the muscle as needed for anaphylaxis. 11/06/22  Yes Trinidy Masterson, Amadeo Garnet, MD  famotidine (PEPCID) 20 MG tablet Take 1 tablet (20 mg total) by mouth daily. 11/06/22 12/06/22 Yes Oriah Leinweber, Amadeo Garnet, MD  acetaminophen (TYLENOL) 500 MG tablet Take 500 mg by mouth every 6 (six) hours as needed for moderate pain.    [provider]  hydrOXYzine (ATARAX) 25 MG tablet Take 1 tablet (25 mg  total) by mouth every 6 (six) hours as needed for itching. 10/28/22 11/27/22  Maxwell Marion, PA-C  omalizumab Geoffry Paradise) 75 MG/0.5ML pen Inject 75 mg into the skin every 30 (thirty) days.    [provider]  predniSONE (DELTASONE) 10 MG tablet Take 2 tablets (20 mg total) by mouth 2 (two) times daily with a meal. 11/05/22   Geoffery Lyons, MD                                                                                                                                    Allergies Vegetable oil, Cantaloupe extract allergy skin test, Fish allergy, Onion, Strawberry (diagnostic), Strawberry extract, and Tomato  Review of Systems Review of Systems As noted in HPI  Physical Exam Vital Signs  I have reviewed the triage vital signs BP 125/81   Pulse 84   Temp 98.4 F (36.9 C)   Resp 18   Ht 5\' 2"  (1.575 m)  Wt 68 kg   LMP 11/02/2022 (Approximate)   SpO2 99%   BMI 27.42 kg/m   Physical Exam Vitals reviewed.  Constitutional:      General: She is not in acute distress.    Appearance: She is well-developed. She is not diaphoretic.  HENT:     Head: Normocephalic and atraumatic.     Right Ear: External ear normal.     Left Ear: External ear normal.     Nose: Nose normal.     Mouth/Throat:     Mouth: No angioedema.     Pharynx: No uvula swelling.  Eyes:     General: No scleral icterus.    Conjunctiva/sclera: Conjunctivae normal.  Neck:     Trachea: Phonation normal.  Cardiovascular:     Rate and Rhythm: Normal rate and regular rhythm.  Pulmonary:     Effort: Pulmonary effort is normal. No respiratory distress.     Breath sounds: No stridor.  Abdominal:     General: There is no distension.  Musculoskeletal:        General: Normal range of motion.     Cervical back: Normal range of motion.  Neurological:     Mental Status: She is alert and oriented to person, place, and time.  Psychiatric:        Behavior: Behavior normal.     ED Results and Treatments Labs (all  labs ordered are listed, but only abnormal results are displayed) Labs Reviewed - No data to display                                                                                                                       EKG  EKG Interpretation Date/Time:    Ventricular Rate:    PR Interval:    QRS Duration:    QT Interval:    QTC Calculation:   R Axis:      Text Interpretation:         Radiology No results found.  Medications Ordered in ED Medications  famotidine (PEPCID) tablet 40 mg (40 mg Oral Given 11/06/22 0456)   Procedures Procedures  (including critical care time) Medical Decision Making / ED Course   Medical Decision Making Risk Prescription drug management.    22 y.o. female here with pruritic rash and tongue swelling. No known triggers or exposures. No respiratory, GI, or neurologic symptoms to suggest anaphylaxis.  Patient has taken benadryl prior to arrival.   On exam, there is no evidence of oral swelling or airway compromise currently.   Given H2 blocker.  Will need to monitor for at least 4 hours after onset. Monitored for 5 - 1/ 2 hours.  No recurrence of tongue swelling.    Final Clinical Impression(s) / ED Diagnoses Final diagnoses:  Allergic reaction, subsequent encounter   The patient appears reasonably screened and/or stabilized for discharge and I doubt any other medical condition or other [facility] requiring further screening, evaluation, or treatment in the ED at this time. I  have discussed the findings, Dx and Tx plan with the patient/family who expressed understanding and agree(s) with the plan. Discharge instructions discussed at length. The patient/family was given strict return precautions who verbalized understanding of the instructions. No further questions at time of discharge.  Disposition: Discharge  Condition: Good  ED Discharge Orders          Ordered    famotidine (PEPCID) 20 MG tablet  Daily        11/06/22 0722     EPINEPHrine 0.3 mg/0.3 mL IJ SOAJ injection  As needed        11/06/22 9604           Follow Up: Ellis Parents, FNP [address] [place] [address] [phone]  Call  to schedule an appointment for close follow up  Allergist  Go to  as scheduled     This chart was dictated using voice recognition software.  Despite best efforts to proofread,  errors can occur which can change the documentation meaning.    Nira Conn, MD 11/06/22 [phone]

## 2022-11-13 ENCOUNTER — Other Ambulatory Visit: Payer: Self-pay

## 2022-11-13 ENCOUNTER — Emergency Department (HOSPITAL_BASED_OUTPATIENT_CLINIC_OR_DEPARTMENT_OTHER)
Admission: EM | Admit: 2022-11-13 | Discharge: 2022-11-13 | Disposition: A | Discharge status: Home / Self Care | Payer: Medicaid Other | Attending: Emergency Medicine | Admitting: Emergency Medicine

## 2022-11-13 DIAGNOSIS — R21 Rash and other nonspecific skin eruption: Secondary | ICD-10-CM | POA: Diagnosis present

## 2022-11-13 DIAGNOSIS — L509 Urticaria, unspecified: Secondary | ICD-10-CM | POA: Insufficient documentation

## 2022-11-13 LAB — PREGNANCY, URINE: Preg Test, Ur: NEGATIVE

## 2022-11-13 MED ORDER — TRIAMCINOLONE ACETONIDE 40 MG/ML IJ SUSP
60.0000 mg | Freq: Once | INTRAMUSCULAR | Status: AC
  Administered 2022-11-13: 60 mg via INTRAMUSCULAR
  Filled 2022-11-13: qty 5

## 2022-11-13 NOTE — ED Provider Notes (Signed)
[place] EMERGENCY DEPARTMENT AT [facility] Provider Note   CSN: [id number] Arrival date & time: 11/13/22  2102     History  Chief Complaint  Patient presents with   Urticaria    Savannah Alvarez is a 22 y.o. female, history of idiopathic chronic urticaria, who presents to the ED secondary to needing a Kenalog injection.  She states she is on cyclosporine, and an injectable, and her urticaria is still not controlled, she was told to come to the ER if she still having problems.  She denies any shortness of breath, facial swelling, or swelling of the throat.  States that her rash and urticaria has been persistent.    Home Medications Prior to Admission medications   Medication Sig Start Date End Date Taking? Authorizing Provider  acetaminophen (TYLENOL) 500 MG tablet Take 500 mg by mouth every 6 (six) hours as needed for moderate pain.    [provider]  EPINEPHrine 0.3 mg/0.3 mL IJ SOAJ injection Inject 0.3 mg into the muscle as needed for anaphylaxis. 11/06/22   Nira Conn, MD  famotidine (PEPCID) 20 MG tablet Take 1 tablet (20 mg total) by mouth daily. 11/06/22 12/06/22  Nira Conn, MD  hydrOXYzine (ATARAX) 25 MG tablet Take 1 tablet (25 mg total) by mouth every 6 (six) hours as needed for itching. 10/28/22 11/27/22  Maxwell Marion, PA-C  omalizumab Geoffry Paradise) 75 MG/0.5ML pen Inject 75 mg into the skin every 30 (thirty) days.    [provider]  predniSONE (DELTASONE) 10 MG tablet Take 2 tablets (20 mg total) by mouth 2 (two) times daily with a meal. 11/05/22   Geoffery Lyons, MD      Allergies    Vegetable oil, Cantaloupe extract allergy skin test, Fish allergy, Onion, Strawberry (diagnostic), Strawberry extract, and Tomato    Review of Systems   Review of Systems  Physical Exam Updated Vital Signs BP 130/76 (BP Location: Right Arm)   Pulse 100   Temp 98.6 F (37 C) (Oral)   Resp 18   LMP 11/02/2022 (Approximate)   SpO2 100%   Physical Exam Vitals and nursing note reviewed.  Constitutional:      General: She is not in acute distress.    Appearance: She is well-developed.  HENT:     Head: Normocephalic and atraumatic.     Mouth/Throat:     Comments: No oropharyngeal swelling, uvula midline.  No facial swelling Eyes:     Conjunctiva/sclera: Conjunctivae normal.  Cardiovascular:     Rate and Rhythm: Normal rate and regular rhythm.     Heart sounds: No murmur heard. Pulmonary:     Effort: Pulmonary effort is normal. No respiratory distress.     Breath sounds: Normal breath sounds.  Abdominal:     Palpations: Abdomen is soft.     Tenderness: There is no abdominal tenderness.  Musculoskeletal:        General: No swelling.     Cervical back: Neck supple.  Skin:    General: Skin is warm and dry.     Capillary Refill: Capillary refill takes less than 2 seconds.     Comments: Diffuse urticaria  Neurological:     Mental Status: She is alert.  Psychiatric:        Mood and Affect: Mood normal.     ED Results / Procedures / Treatments   Labs (all labs ordered are listed, but only abnormal results are displayed) Labs Reviewed  PREGNANCY, URINE    EKG None  Radiology No results found.  Procedures Procedures    Medications Ordered in ED Medications  triamcinolone acetonide (KENALOG-40) injection 60 mg (has no administration in time range)    ED Course/ Medical Decision Making/ A&P                                 Medical Decision Making Patient is a 22 year old female, here for Kenalog shot, I informed her that I have not been given Kenalog in the ER, she request a consult her allergist.  I spoke with Dr. Lucendia Herrlich, allergist at [facility], call, and he recommends 60 mg IM Kenalog, and follow-up in the office.  Patient has foreign pharyngeal swelling, uvula midline, no stridor noted.  Given Kenalog, discharged home  Amount and/or Complexity of Data Reviewed Labs:  ordered.  Risk Prescription drug management.   Final Clinical Impression(s) / ED Diagnoses Final diagnoses:  Urticaria    Rx / DC Orders ED Discharge Orders     None         Kaena Santori, Harley Alto, PA 11/13/22 2326    Rondel Baton, MD 11/15/22 [phone]

## 2022-11-13 NOTE — Discharge Instructions (Addendum)
Please follow-up with your allergist, we have given you 1 dose of Kenalog in the ER, however this is a long-term illness, and you will need to follow-up with your primary care doctor.  If you have any difficulty breathing, shortness of breath return to the ER immediately

## 2022-11-13 NOTE — ED Triage Notes (Signed)
Pt with chronic idiopathic urticaria followed by an allergist  Was told if the most recent treatments did not work to present to the ED for Kenalog injection.

## 2022-11-14 ENCOUNTER — Emergency Department (HOSPITAL_COMMUNITY)
Admission: EM | Admit: 2022-11-14 | Discharge: 2022-11-14 | Disposition: A | Discharge status: Home / Self Care | Payer: Medicaid Other | Attending: Emergency Medicine | Admitting: Emergency Medicine

## 2022-11-14 ENCOUNTER — Other Ambulatory Visit: Payer: Self-pay

## 2022-11-14 ENCOUNTER — Encounter (HOSPITAL_COMMUNITY): Payer: Self-pay

## 2022-11-14 DIAGNOSIS — L299 Pruritus, unspecified: Secondary | ICD-10-CM | POA: Diagnosis not present

## 2022-11-14 DIAGNOSIS — T7840XA Allergy, unspecified, initial encounter: Secondary | ICD-10-CM | POA: Diagnosis present

## 2022-11-14 MED ORDER — HYDROXYZINE HCL 10 MG PO TABS
10.0000 mg | ORAL_TABLET | Freq: Once | ORAL | Status: AC
  Administered 2022-11-14: 10 mg via ORAL
  Filled 2022-11-14: qty 1

## 2022-11-14 MED ORDER — LORAZEPAM 1 MG PO TABS: 0.5000 mg | ORAL_TABLET | Freq: Three times a day (TID) | ORAL | 0 refills | Status: DC | PRN

## 2022-11-14 MED ORDER — LORAZEPAM 1 MG PO TABS
1.0000 mg | ORAL_TABLET | Freq: Once | ORAL | Status: AC
  Administered 2022-11-14: 1 mg via ORAL
  Filled 2022-11-14: qty 1

## 2022-11-14 MED ORDER — FAMOTIDINE 20 MG PO TABS
20.0000 mg | ORAL_TABLET | Freq: Once | ORAL | Status: AC
  Administered 2022-11-14: 20 mg via ORAL
  Filled 2022-11-14: qty 1

## 2022-11-14 MED ORDER — FAMOTIDINE 20 MG PO TABS: 20.0000 mg | ORAL_TABLET | Freq: Two times a day (BID) | ORAL | 0 refills | Status: DC

## 2022-11-14 MED ORDER — HYDROXYZINE HCL 25 MG PO TABS: 25.0000 mg | ORAL_TABLET | Freq: Three times a day (TID) | ORAL | 0 refills | Status: DC | PRN

## 2022-11-14 NOTE — ED Triage Notes (Signed)
Complaining of hives, tongue and lips are swelling. Was seen at [facility] and she came over here because things were not getting better.

## 2022-11-14 NOTE — ED Provider Notes (Signed)
[place] EMERGENCY DEPARTMENT AT [facility] Provider Note   CSN: [id number] Arrival date & time: 11/14/22  2952     History  Chief Complaint  Patient presents with   Allergic Reaction    Savannah Alvarez is a 22 y.o. female.  22 year old female with multiple medical problems most concerning of which at this time is urticaria of undetermined etiology.  Otherwise with her helps supplement the history, her in the patient states that she has had a sewed ever since she was in third grade.  No was ever been to figure out why.  Most recently has been going on for a few months.  She states the Benadryl and Atarax do not seem to be working anymore.  She is on ciclosporine, Xolair at home.  She recently had Guillain-Barr which has resolved and she has not had any IgG since March.  She was at [facility] last night where she received a Kenalog injection.  She states that her tongue got swollen since then and the itching did not get any better so she presents here for repeat evaluation.  At this time she is like her symptoms have improved besides the itching.  She mostly wants help with the itching as it keeps her up at night from sleeping.  Has not tried Benadryl recently.   Allergic Reaction      Home Medications Prior to Admission medications   Medication Sig Start Date End Date Taking? Authorizing Provider  famotidine (PEPCID) 20 MG tablet Take 1 tablet (20 mg total) by mouth 2 (two) times daily. 11/14/22  Yes Rindi Beechy, Barbara Cower, MD  hydrOXYzine (ATARAX) 25 MG tablet Take 1 tablet (25 mg total) by mouth every 8 (eight) hours as needed. 11/14/22  Yes Nussen Pullin, Barbara Cower, MD  LORazepam (ATIVAN) 1 MG tablet Take 0.5-1 tablets (0.5-1 mg total) by mouth 3 (three) times daily as needed for sleep. 11/14/22  Yes Naoma Boxell, Barbara Cower, MD  acetaminophen (TYLENOL) 500 MG tablet Take 500 mg by mouth every 6 (six) hours as needed for moderate pain.    [provider]  EPINEPHrine 0.3 mg/0.3 mL IJ SOAJ  injection Inject 0.3 mg into the muscle as needed for anaphylaxis. 11/06/22   Nira Conn, MD  omalizumab Geoffry Paradise) 75 MG/0.5ML pen Inject 75 mg into the skin every 30 (thirty) days.    [provider]  predniSONE (DELTASONE) 10 MG tablet Take 2 tablets (20 mg total) by mouth 2 (two) times daily with a meal. 11/05/22   Geoffery Lyons, MD      Allergies    Vegetable oil, Cantaloupe extract allergy skin test, Fish allergy, Onion, Strawberry (diagnostic), Strawberry extract, and Tomato    Review of Systems   Review of Systems  Physical Exam Updated Vital Signs BP (!) 145/85 (BP Location: Right Arm)   Pulse 93   Temp 98.2 F (36.8 C) (Oral)   Resp 15   Ht 5\' 2"  (1.575 m)   Wt 67.1 kg   LMP 11/02/2022 (Approximate)   SpO2 100%   BMI 27.07 kg/m  Physical Exam Vitals and nursing note reviewed.  Constitutional:      Appearance: She is well-developed.  HENT:     Head: Normocephalic and atraumatic.     Comments: No lingual edema, sublingual edema, buccal edema or other concern for possible airway compromise. Cardiovascular:     Rate and Rhythm: Normal rate and regular rhythm.  Pulmonary:     Effort: No respiratory distress.     Breath sounds: No  stridor.  Abdominal:     General: There is no distension.  Musculoskeletal:     Cervical back: Normal range of motion.  Skin:    Comments: Diffuse excoriations without obvious urticaria at this time.  Neurological:     Mental Status: She is alert.     ED Results / Procedures / Treatments   Labs (all labs ordered are listed, but only abnormal results are displayed) Labs Reviewed - No data to display  EKG None  Radiology No results found.  Procedures Procedures    Medications Ordered in ED Medications  LORazepam (ATIVAN) tablet 1 mg (1 mg Oral Given 11/14/22 0621)  hydrOXYzine (ATARAX) tablet 10 mg (10 mg Oral Given 11/14/22 0622)  famotidine (PEPCID) tablet 20 mg (20 mg Oral Given 11/14/22 1610)    ED  Course/ Medical Decision Making/ A&P                                 Medical Decision Making Risk Prescription drug management.   I discussed with her how long the Kenalog will take to start working.  I discussed with the pharmacist to see if there is any alternative agents we could use as she is already tried topical, Benadryl and Atarax in the past.  Does not signs of anything else.  Will try Ativan for sleep along with Atarax and Pepcid at home.  Patient will follow-up with her allergist.  No evidence of airway compromise, shock or other concerning symptoms related to the urticaria.  Stable for discharge.  All questions answered. Patient is states she read somewhere that the Benadryl should be taken with the cyclosporine.  As far as I can tell it just decreases the metabolism of a little bit so I told her not to take more than 25 mg 4 times a day and not for prolonged period of time.  Final Clinical Impression(s) / ED Diagnoses Final diagnoses:  Pruritus    Rx / DC Orders ED Discharge Orders          Ordered    LORazepam (ATIVAN) 1 MG tablet  3 times daily PRN        11/14/22 0608    hydrOXYzine (ATARAX) 25 MG tablet  Every 8 hours PRN        11/14/22 0608    famotidine (PEPCID) 20 MG tablet  2 times daily        11/14/22 0608              Topanga Alvelo, Barbara Cower, MD 11/14/22 [phone]

## 2022-11-20 ENCOUNTER — Emergency Department (HOSPITAL_COMMUNITY)
Admission: EM | Admit: 2022-11-20 | Discharge: 2022-11-20 | Disposition: A | Discharge status: Home / Self Care | Payer: Medicaid Other | Attending: Emergency Medicine | Admitting: Emergency Medicine

## 2022-11-20 ENCOUNTER — Other Ambulatory Visit: Payer: Self-pay

## 2022-11-20 ENCOUNTER — Encounter (HOSPITAL_COMMUNITY): Payer: Self-pay | Admitting: Emergency Medicine

## 2022-11-20 DIAGNOSIS — R202 Paresthesia of skin: Secondary | ICD-10-CM | POA: Insufficient documentation

## 2022-11-20 DIAGNOSIS — R200 Anesthesia of skin: Secondary | ICD-10-CM | POA: Diagnosis present

## 2022-11-20 LAB — CBC WITH DIFFERENTIAL/PLATELET
Abs Immature Granulocytes: 0.03 10*3/uL (ref 0.00–0.07)
Basophils Absolute: 0 10*3/uL (ref 0.0–0.1)
Basophils Relative: 0 %
Eosinophils Absolute: 0 10*3/uL (ref 0.0–0.5)
Eosinophils Relative: 0 %
HCT: 32.8 % — ABNORMAL LOW (ref 36.0–46.0)
Hemoglobin: 9.8 g/dL — ABNORMAL LOW (ref 12.0–15.0)
Immature Granulocytes: 0 %
Lymphocytes Relative: 31 %
Lymphs Abs: 2.6 10*3/uL (ref 0.7–4.0)
MCH: 22.5 pg — ABNORMAL LOW (ref 26.0–34.0)
MCHC: 29.9 g/dL — ABNORMAL LOW (ref 30.0–36.0)
MCV: 75.4 fL — ABNORMAL LOW (ref 80.0–100.0)
Monocytes Absolute: 0.3 10*3/uL (ref 0.1–1.0)
Monocytes Relative: 4 %
Neutro Abs: 5.5 10*3/uL (ref 1.7–7.7)
Neutrophils Relative %: 65 %
Platelets: 534 10*3/uL — ABNORMAL HIGH (ref 150–400)
RBC: 4.35 MIL/uL (ref 3.87–5.11)
RDW: 17.9 % — ABNORMAL HIGH (ref 11.5–15.5)
WBC: 8.5 10*3/uL (ref 4.0–10.5)
nRBC: 0 % (ref 0.0–0.2)

## 2022-11-20 LAB — HCG, SERUM, QUALITATIVE: Preg, Serum: NEGATIVE

## 2022-11-20 LAB — COMPREHENSIVE METABOLIC PANEL
ALT: 22 U/L (ref 0–44)
AST: 24 U/L (ref 15–41)
Albumin: 3.4 g/dL — ABNORMAL LOW (ref 3.5–5.0)
Alkaline Phosphatase: 60 U/L (ref 38–126)
Anion gap: 9 (ref 5–15)
BUN: 10 mg/dL (ref 6–20)
CO2: 24 mmol/L (ref 22–32)
Calcium: 9.1 mg/dL (ref 8.9–10.3)
Chloride: 103 mmol/L (ref 98–111)
Creatinine, Ser: 0.84 mg/dL (ref 0.44–1.00)
GFR, Estimated: >60 mL/min (ref >60)
Glucose, Bld: 148 mg/dL — ABNORMAL HIGH (ref 70–99)
Potassium: 3.9 mmol/L (ref 3.5–5.1)
Sodium: 136 mmol/L (ref 135–145)
Total Bilirubin: 0.5 mg/dL (ref 0.3–1.2)
Total Protein: 6.5 g/dL (ref 6.5–8.1)

## 2022-11-20 LAB — MAGNESIUM: Magnesium: 1.6 mg/dL — ABNORMAL LOW (ref 1.7–2.4)

## 2022-11-20 NOTE — ED Triage Notes (Signed)
Patient here with hx of Guillian-Barre and complains of whole body numbness stomach, back, arms, legs. States this happens on and off but started at 9 pm tonight and has persisted. Denies shortness of breath, reports of chest pain chronically for a year.

## 2022-11-20 NOTE — ED Provider Notes (Signed)
[place] EMERGENCY DEPARTMENT AT [facility] Provider Note   CSN: [id number] Arrival date & time: 11/20/22  0044     History  Chief Complaint  Patient presents with   whole body numbness    Savannah Alvarez is a 22 y.o. female.  The history is provided by the patient.  Savannah Alvarez is a 22 y.o. female who presents to the Emergency Department complaining of numbness.  She presents to the emergency department for evaluation of numbness described as a pins and needle sensation throughout bilateral legs, hands, stomach, low back and face.  It is described as not being able to feel as well as pins-and-needles as well as burning.  She does have a history of Guillain-Barr and does get waxing and waning numbness.  Her symptoms normally only last a few minutes but this has been constant since 10 PM.  She also reports 1 year of intermittent chest discomfort as well as intermittent abdominal discomfort for several months.  No fevers, difficulty breathing, nausea, vomiting, diarrhea.  She did start cyclosporine 2 and half weeks ago for her chronic urticaria.  She also takes cetirizine.      Home Medications Prior to Admission medications   Medication Sig Start Date End Date Taking? Authorizing Provider  acetaminophen (TYLENOL) 500 MG tablet Take 500 mg by mouth every 6 (six) hours as needed for moderate pain.    [provider]  EPINEPHrine 0.3 mg/0.3 mL IJ SOAJ injection Inject 0.3 mg into the muscle as needed for anaphylaxis. 11/06/22   Nira Conn, MD  famotidine (PEPCID) 20 MG tablet Take 1 tablet (20 mg total) by mouth 2 (two) times daily. 11/14/22   Mesner, Barbara Cower, MD  hydrOXYzine (ATARAX) 25 MG tablet Take 1 tablet (25 mg total) by mouth every 8 (eight) hours as needed. 11/14/22   Mesner, Barbara Cower, MD  LORazepam (ATIVAN) 1 MG tablet Take 0.5-1 tablets (0.5-1 mg total) by mouth 3 (three) times daily as needed for sleep. 11/14/22   Mesner, Barbara Cower, MD  omalizumab Geoffry Paradise)  75 MG/0.5ML pen Inject 75 mg into the skin every 30 (thirty) days.    [provider]  predniSONE (DELTASONE) 10 MG tablet Take 2 tablets (20 mg total) by mouth 2 (two) times daily with a meal. 11/05/22   Geoffery Lyons, MD      Allergies    Vegetable oil, Cantaloupe extract allergy skin test, Fish allergy, Onion, Strawberry (diagnostic), Strawberry extract, and Tomato    Review of Systems   Review of Systems  All other systems reviewed and are negative.   Physical Exam Updated Vital Signs BP 121/66   Pulse 93   Temp 98.5 F (36.9 C) (Oral)   Resp 16   Ht 5\' 2"  (1.575 m)   Wt 67 kg   LMP 11/02/2022 (Approximate)   SpO2 100%   BMI 27.02 kg/m  Physical Exam Vitals and nursing note reviewed.  Constitutional:      Appearance: She is well-developed.  HENT:     Head: Normocephalic and atraumatic.  Cardiovascular:     Rate and Rhythm: Normal rate and regular rhythm.     Heart sounds: No murmur heard. Pulmonary:     Effort: Pulmonary effort is normal. No respiratory distress.     Breath sounds: Normal breath sounds.  Abdominal:     Palpations: Abdomen is soft.     Tenderness: There is no abdominal tenderness. There is no guarding or rebound.  Musculoskeletal:  General: No tenderness.  Skin:    General: Skin is warm and dry.     Comments: Urticaria to the arms, legs bilaterally  Neurological:     Mental Status: She is alert and oriented to person, place, and time.     Comments: 5 out of 5 strength in bilateral upper and lower extremities in the distal and proximal muscle groups.  There is altered sensation to light touch throughout bilateral arms, legs, face.  No asymmetry of facial movement.  2+ patellar reflexes bilaterally.  No ankle clonus.  Unable to elicit upper extremity reflexes.  Psychiatric:        Behavior: Behavior normal.     ED Results / Procedures / Treatments   Labs (all labs ordered are listed, but only abnormal results are displayed) Labs  Reviewed  CBC WITH DIFFERENTIAL/PLATELET - Abnormal; Notable for the following components:      Result Value   Hemoglobin 9.8 (*)    HCT 32.8 (*)    MCV 75.4 (*)    MCH 22.5 (*)    MCHC 29.9 (*)    RDW 17.9 (*)    Platelets 534 (*)    All other components within normal limits  COMPREHENSIVE METABOLIC PANEL - Abnormal; Notable for the following components:   Glucose, Bld 148 (*)    Albumin 3.4 (*)    All other components within normal limits  MAGNESIUM - Abnormal; Notable for the following components:   Magnesium 1.6 (*)    All other components within normal limits  HCG, SERUM, QUALITATIVE    EKG None  Radiology No results found.  Procedures Procedures    Medications Ordered in ED Medications - No data to display  ED Course/ Medical Decision Making/ A&P                                 Medical Decision Making Amount and/or Complexity of Data Reviewed Labs: ordered.   Patient with history of Guillain-Barr here for evaluation of recurrent paresthesias involving her entire body.  She has had waxing and waning episodes in the past but none that have lasted for this many hours.  She does have altered sensation, otherwise preserved reflexes in the patellar region.  Current picture is not consistent with recurrent acute demyelinating process.  Feel she is stable for outpatient follow-up with neurology with close return precautions.  Magnesium is mildly low, discussed oral supplementation over-the-counter.  Hemoglobin is at her baseline.        Final Clinical Impression(s) / ED Diagnoses Final diagnoses:  None    Rx / DC Orders ED Discharge Orders     None         Tilden Fossa, MD 11/20/22 [phone]

## 2022-11-27 ENCOUNTER — Ambulatory Visit: Payer: Medicaid Other | Admitting: Allergy

## 2022-12-01 ENCOUNTER — Other Ambulatory Visit: Payer: Self-pay

## 2022-12-01 DIAGNOSIS — L509 Urticaria, unspecified: Secondary | ICD-10-CM | POA: Diagnosis not present

## 2022-12-01 DIAGNOSIS — R21 Rash and other nonspecific skin eruption: Secondary | ICD-10-CM | POA: Diagnosis present

## 2022-12-02 ENCOUNTER — Emergency Department (HOSPITAL_BASED_OUTPATIENT_CLINIC_OR_DEPARTMENT_OTHER)
Admission: EM | Admit: 2022-12-02 | Discharge: 2022-12-02 | Disposition: A | Discharge status: Home / Self Care | Payer: Medicaid Other | Attending: Emergency Medicine | Admitting: Emergency Medicine

## 2022-12-02 ENCOUNTER — Encounter (HOSPITAL_BASED_OUTPATIENT_CLINIC_OR_DEPARTMENT_OTHER): Payer: Self-pay

## 2022-12-02 DIAGNOSIS — L509 Urticaria, unspecified: Secondary | ICD-10-CM

## 2022-12-02 MED ORDER — DEXAMETHASONE SODIUM PHOSPHATE 10 MG/ML IJ SOLN
10.0000 mg | Freq: Once | INTRAMUSCULAR | Status: AC
  Administered 2022-12-02: 10 mg via INTRAMUSCULAR
  Filled 2022-12-02: qty 1

## 2022-12-02 MED ORDER — HYDROXYZINE HCL 25 MG PO TABS
25.0000 mg | ORAL_TABLET | Freq: Once | ORAL | Status: AC
  Administered 2022-12-02: 25 mg via ORAL
  Filled 2022-12-02: qty 1

## 2022-12-02 NOTE — ED Triage Notes (Signed)
Pt here for chronic hives that have been going on for several months.

## 2022-12-02 NOTE — ED Provider Notes (Signed)
[place] EMERGENCY DEPARTMENT AT [facility] Provider Note   CSN: [id number] Arrival date & time: 12/01/22  2354     History  Chief Complaint  Patient presents with   Urticaria    Savannah Alvarez is a 22 y.o. female.  Patient is a 22 year old female with past medical history of chronic urticaria followed by an allergist at [facility].  Patient presenting today with complaints of rash and itching.  She describes welts to her back that make it difficult for her to sleep.  She was prescribed cyclosporine by her allergist, but does not seem to be getting relief.  She denies any new contacts or exposures.  No airway swelling.  The history is provided by the patient.       Home Medications Prior to Admission medications   Medication Sig Start Date End Date Taking? Authorizing Provider  acetaminophen (TYLENOL) 500 MG tablet Take 500 mg by mouth every 6 (six) hours as needed for moderate pain.    [provider]  EPINEPHrine 0.3 mg/0.3 mL IJ SOAJ injection Inject 0.3 mg into the muscle as needed for anaphylaxis. 11/06/22   Nira Conn, MD  famotidine (PEPCID) 20 MG tablet Take 1 tablet (20 mg total) by mouth 2 (two) times daily. 11/14/22   Mesner, Barbara Cower, MD  hydrOXYzine (ATARAX) 25 MG tablet Take 1 tablet (25 mg total) by mouth every 8 (eight) hours as needed. 11/14/22   Mesner, Barbara Cower, MD  LORazepam (ATIVAN) 1 MG tablet Take 0.5-1 tablets (0.5-1 mg total) by mouth 3 (three) times daily as needed for sleep. 11/14/22   Mesner, Barbara Cower, MD  omalizumab Geoffry Paradise) 75 MG/0.5ML pen Inject 75 mg into the skin every 30 (thirty) days.    [provider]  predniSONE (DELTASONE) 10 MG tablet Take 2 tablets (20 mg total) by mouth 2 (two) times daily with a meal. 11/05/22   Geoffery Lyons, MD      Allergies    Vegetable oil, Cantaloupe extract allergy skin test, Fish allergy, Onion, Strawberry (diagnostic), Strawberry extract, and Tomato    Review of Systems   Review of  Systems  All other systems reviewed and are negative.   Physical Exam Updated Vital Signs BP 121/75 (BP Location: Right Arm)   Pulse (!) 110   Temp 99.3 F (37.4 C) (Oral)   Resp 15   LMP 11/02/2022 (Approximate)   SpO2 100%  Physical Exam Vitals and nursing note reviewed.  Constitutional:      General: She is not in acute distress.    Appearance: She is well-developed. She is not diaphoretic.  HENT:     Head: Normocephalic and atraumatic.  Cardiovascular:     Rate and Rhythm: Normal rate and regular rhythm.     Heart sounds: No murmur heard.    No friction rub. No gallop.  Pulmonary:     Effort: Pulmonary effort is normal. No respiratory distress.     Breath sounds: Normal breath sounds. No wheezing.  Abdominal:     General: Bowel sounds are normal. There is no distension.     Palpations: Abdomen is soft.     Tenderness: There is no abdominal tenderness.  Musculoskeletal:        General: Normal range of motion.     Cervical back: Normal range of motion and neck supple.  Skin:    General: Skin is warm and dry.     Comments: There is an urticarial rash noted to her shoulders and back.  Neurological:  General: No focal deficit present.     Mental Status: She is alert and oriented to person, place, and time.     ED Results / Procedures / Treatments   Labs (all labs ordered are listed, but only abnormal results are displayed) Labs Reviewed - No data to display  EKG None  Radiology No results found.  Procedures Procedures    Medications Ordered in ED Medications  dexamethasone (DECADRON) injection 10 mg (has no administration in time range)  hydrOXYzine (ATARAX) tablet 25 mg (has no administration in time range)    ED Course/ Medical Decision Making/ A&P  Patient with chronic hives presenting with complaints of itching and rash.  Patient given IM Decadron and hydroxyzine and seems to be feeling better.  I will defer additional care to patient's  allergist.  Final Clinical Impression(s) / ED Diagnoses Final diagnoses:  None    Rx / DC Orders ED Discharge Orders     None         Geoffery Lyons, MD 12/02/22 [phone]

## 2022-12-02 NOTE — Discharge Instructions (Signed)
Follow-up with your allergist in the next 1 to 2 days.

## 2022-12-03 ENCOUNTER — Telehealth: Payer: Self-pay | Admitting: Hematology and Oncology

## 2022-12-03 NOTE — Telephone Encounter (Signed)
Cancelled appointments per patients request via incoming call. Patient has scheduled a HEM appointment with [facility]. Referral will be closed.

## 2022-12-07 ENCOUNTER — Inpatient Hospital Stay: Payer: Medicaid Other

## 2022-12-07 ENCOUNTER — Inpatient Hospital Stay: Payer: Medicaid Other | Admitting: Hematology and Oncology

## 2022-12-10 ENCOUNTER — Ambulatory Visit: Payer: Medicaid Other | Admitting: Family Medicine

## 2022-12-10 ENCOUNTER — Encounter: Payer: Self-pay | Admitting: Family Medicine

## 2022-12-10 VITALS — BP 130/80 | HR 100 | Ht 62.0 in | Wt 164.5 lb

## 2022-12-10 DIAGNOSIS — R202 Paresthesia of skin: Secondary | ICD-10-CM | POA: Diagnosis not present

## 2022-12-10 DIAGNOSIS — G610 Guillain-Barre syndrome: Secondary | ICD-10-CM

## 2022-12-10 DIAGNOSIS — L508 Other urticaria: Secondary | ICD-10-CM

## 2022-12-10 DIAGNOSIS — D509 Iron deficiency anemia, unspecified: Secondary | ICD-10-CM

## 2022-12-10 DIAGNOSIS — R29898 Other symptoms and signs involving the musculoskeletal system: Secondary | ICD-10-CM

## 2022-12-10 NOTE — Patient Instructions (Addendum)
Below is our plan:  We will continue to monitor closely. Follow up closely with Allergy and hematology. Start iron asap. Ask about B12 levels. Discuss repeating autoimmune labs with PCP if needed. Think about gabapentin. We can restart for headaches if needed.   Please make sure you are staying well hydrated. I recommend 50-60 ounces daily. Well balanced diet and regular exercise encouraged. Consistent sleep schedule with 6-8 hours recommended.   Please continue follow up with care team as directed.   Follow up with Dr Terrace Arabia in 3-4 months  You may receive a survey regarding today's visit. I encourage you to leave honest feed back as I do use this information to improve patient care. Thank you for seeing me today!

## 2022-12-10 NOTE — Progress Notes (Signed)
Chief Complaint  Patient presents with   Room 1    Pt is here with her Mother. Pt's mother states that pt's hives and swelling has started back up. Pt states that she doesn't have any pain, but itching. Pt states that she is having numbness all over her body and she had to go to the hospital.     HISTORY OF PRESENT ILLNESS:  12/10/22 ALL:  Savannah Alvarez returns for an acute visit for hives and itching. 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 Previously treated with IVIG, stopped in 04/2022. She reports doing well from AIDP standpoint. No significant weakness. No trouble swallowing or walking. She does report waxing and waning weakness of lower extremities. Symptoms present since diagnosis. No progression or worsening since last visit. Her mother is concerned about hives. She has chronic urticaria followed by allergist with [facility]. On cyclosporin and cetrizine. Recently started on Xolair. She takes second dose tomorrow. Clear diagnosis not found. Extensive lab eval has been unremarkable. She was seen yesterday by hematology. Ferratin was 6. B12 208. She was started on iron supplements. She denies heavy menstrual cycles. No frank rectal bleeding.   Migraines wax and wane. She was previously taking gabapentin but stopped as it was no longer needed. She feels stress of recent illness and fatigue are contributing. She is drinking about 5-6 glasses of water daily.   09/09/2020 ALL:  Savannah Alvarez is a 22 y.o. female here today for follow up for headaches. She was started on gabapentin 100mg  at bedtime at last visit with Dr Frances Furbish 04/2020. Nurtec was continued for abortive therapy and Maxalt added. She was advised to wean OTC analgesics. She reports that headaches continue. She has a headache every day. She reports that she usually has a sharp pain in the center of her forehead. Pain is daily. She has sound and light sensitivity at least every other day. May have throbbing pain if headache is really bad. Pain  is not worsened with activity. Rizatriptan did not help much. She is having to take Tylenol and ibuprofen regularly for hip pain. She was seen by PCP last week and given exercises to perform at home. She reports not sleeping well due to pain. She reports drinking water daily but unsure the amount.    HISTORY (copied from Dr Teofilo Pod previous note)  Savannah Alvarez is an 22 year old female with an underlying benign medical history except eczema, who Presents for follow-up consultation of her recurrent headaches.  The patient is accompanied by her mom again today.  I first met her on 02/29/2020 at the request of her primary care nurse practitioner, at which time she reported a recent sports related accident about 2 months prior.  She was diagnosed with a mild concussion.  She had a nonfocal exam, overall felt a little better but had recurrent headaches.  She was not sleeping very well.  She also reported a prior history of intermittent migraines.  I suggested she start low-dose amitriptyline.  She was planning to go back to [place] for college.  Her mother called in the interim reporting that she still has headaches.  We arranged for follow-up appointment since she was not able to see somebody in [place].   Today, 05/15/2020: She reports that she can go a day or 2 without headaches but lately, she has had a daily headache, it is typically in the bifrontal and bitemporal areas, can be constant or throbbing, sometimes she has light sensitivity and nausea, sometimes  she does not have nausea.  She stopped taking the amitriptyline earlier this month and noticed a slight worsening of her headache but did not notice any benefit when she was on it and she titrated up to the 2 pills daily.  She has not been doing any cheerleading exercises as she has not been able to participate.  She has been trying to use her eyeglasses consistently and she has tried to hydrate well with water, tries to rest.  She has been taking  over-the-counter Tylenol and Advil, Advil seems to help a little more and has infected taking 2 pills every 2-3 hours on a nearly daily basis.  Her primary care nurse practitioner has provided her with a sample of Nurtec, she used it recently and thought it helped a little bit.    The patient's allergies, current medications, family history, past medical history, past social history, past surgical history and problem list were reviewed and updated as appropriate.    Previously:   02/29/20: (She) had a fall about 2 months ago.  She fell during tumbling, at her college, in [place]. She fell backwards and hit her head, no loss of consciousness or laceration were sustained, thankfully.  She had seen a school physician, the athletic doctor, and was apparently diagnosed with mild concussion.  She did not have any x-rays or other imaging done.  Mom says that she has amnesia for the moments preceding the fall.  I reviewed your office note from 01/12/20. She was advised to go to the ER. She presented to the emergency room on 01/12/2020 with complaints of headaches, light sensitivity, nausea, emotional lability, dizziness, and memory impairment.  I reviewed the emergency room records.  She had a normal neurological exam, normal vital signs, GCS 15.  She did not receive any treatment in the emergency room or imaging tests, was provided with instructions for cognitive rest and proper stepwise return to sports.  She reports that she still has intermittent headaches, they occur almost every other day.  They are mostly in the frontal areas, right or left side or in the middle.  She has occasional nausea, no vomiting, occasional light sensitivity but not every time.  Overall, she feels a little better.  She has been using Tylenol over-the-counter, 500 mg strength 2 pills twice daily or up to 3 times a day.  Mom believes that she is doing better thankfully.  She has occasional blurry vision.  She does have prescription eyeglasses  and had a recent eye examination in August 2021 but not since her head injury.  She denies any sudden onset of one-sided weakness or numbness or tingling or droopy face or slurring of speech or hearing loss or vision loss.  No double vision, only occasional blurry vision.  She denies any significant depression or anxiety.  She is currently not on any prescription medication.  She does not sleep very well.   REVIEW OF SYSTEMS: Out of a complete 14 system review of symptoms, the patient complains only of the following symptoms, headaches, hip pain, insomnia and all other reviewed systems are negative.   ALLERGIES: Allergies  Allergen Reactions   Vegetable Oil Anaphylaxis    Patient states that she is allergic to PALM OIL   Cantaloupe Extract Allergy Skin Test Swelling   Fish Allergy Hives   Onion Swelling   Strawberry (Diagnostic) Hives   Strawberry Extract Hives   Tomato Swelling     HOME MEDICATIONS: Outpatient Medications Prior to Visit  Medication Sig  Dispense Refill   EPINEPHrine 0.3 mg/0.3 mL IJ SOAJ injection Inject 0.3 mg into the muscle as needed for anaphylaxis. 2 each 1   famotidine (PEPCID) 20 MG tablet Take 1 tablet (20 mg total) by mouth 2 (two) times daily. 10 tablet 0   hydrOXYzine (ATARAX) 25 MG tablet Take 1 tablet (25 mg total) by mouth every 8 (eight) hours as needed. 12 tablet 0   omalizumab (XOLAIR) 75 MG/0.5ML pen Inject 75 mg into the skin every 30 (thirty) days.     acetaminophen (TYLENOL) 500 MG tablet Take 500 mg by mouth every 6 (six) hours as needed for moderate pain. (Patient not taking: Reported on 12/10/2022)     LORazepam (ATIVAN) 1 MG tablet Take 0.5-1 tablets (0.5-1 mg total) by mouth 3 (three) times daily as needed for sleep. (Patient not taking: Reported on 12/10/2022) 5 tablet 0   predniSONE (DELTASONE) 10 MG tablet Take 2 tablets (20 mg total) by mouth 2 (two) times daily with a meal. (Patient not taking: Reported on 12/10/2022) 20 tablet 0   No  facility-administered medications prior to visit.     PAST MEDICAL HISTORY: Past Medical History:  Diagnosis Date   Allergic urticaria    Anxiety and depression    Eczema    Guillain-Barre syndrome (HCC)    Vitamin D deficiency      PAST SURGICAL HISTORY: Past Surgical History:  Procedure Laterality Date   HIP SURGERY Right      FAMILY HISTORY: Family History  Problem Relation Age of Onset   Hypertension Father    Diabetes Father    Hyperthyroidism Mother    Diabetes Maternal Grandmother    Hypertension Maternal Grandmother    Diabetes Maternal Grandfather    Hypertension Maternal Grandfather      SOCIAL HISTORY: Social History   Socioeconomic History   Marital status: Single    Spouse name: Not on file   Number of children: 0   Years of education: Not on file   Highest education level: Some college, no degree  Occupational History    Comment: student in college  Tobacco Use   Smoking status: Never    Passive exposure: Never   Smokeless tobacco: Never  Vaping Use   Vaping status: Never Used  Substance and Sexual Activity   Alcohol use: Never   Drug use: Never   Sexual activity: Yes    Birth control/protection: None  Other Topics Concern   Not on file  Social History Narrative   Lives at college   1 can of [place] per day   Social Determinants of Health   Financial Resource Strain: Not on file  Food Insecurity: No Food Insecurity (01/29/2022)   Hunger Vital Sign    Worried About Running Out of Food in the Last Year: Never true    Ran Out of Food in the Last Year: Never true  Transportation Needs: No Transportation Needs (01/29/2022)   PRAPARE - Administrator, Civil Service (Medical): No    Lack of Transportation (Non-Medical): No  Physical Activity: Inactive (08/04/2022)   Exercise Vital Sign    Days of Exercise per Week: 0 days    Minutes of Exercise per Session: 0 min  Stress: Not on file  Social Connections: Unknown (07/08/2021)    Received from [facility]   Social Network    Social Network: Not on file  Intimate Partner Violence: Not At Risk (02/01/2022)   Humiliation, Afraid, Rape, and Kick questionnaire  Fear of Current or Ex-Partner: No    Emotionally Abused: No    Physically Abused: No    Sexually Abused: No     PHYSICAL EXAM  Vitals:   12/10/22 1433  BP: 130/80  Pulse: 100  Weight: 164 lb 8 oz (74.6 kg)  Height: 5\' 2"  (1.575 m)    Body mass index is 30.09 kg/m.   Generalized: Well developed, in no acute distress  Cardiology: normal rate and rhythm, no murmur auscultated  Respiratory: clear to auscultation bilaterally    Neurological examination  Mentation: Alert oriented to time, place, history taking. Follows all commands speech and language fluent Cranial nerve II-XII: Pupils were equal round reactive to light. Extraocular movements were full, visual field were full on confrontational test. Facial sensation and strength were normal. Head turning and shoulder shrug  were normal and symmetric. Motor: The motor testing reveals 5 over 5 strength of all 4 extremities. Good symmetric motor tone is noted throughout.  Gait and station: Gait is normal.    DIAGNOSTIC DATA (LABS, IMAGING, TESTING) - I reviewed patient records, labs, notes, testing and imaging myself where available.  Lab Results  Component Value Date   WBC 8.5 11/20/2022   HGB 9.8 (L) 11/20/2022   HCT 32.8 (L) 11/20/2022   MCV 75.4 (L) 11/20/2022   PLT 534 (H) 11/20/2022      Component Value Date/Time   NA 136 11/20/2022 0117   NA 140 02/05/2022 1308   K 3.9 11/20/2022 0117   CL 103 11/20/2022 0117   CO2 24 11/20/2022 0117   GLUCOSE 148 (H) 11/20/2022 0117   BUN 10 11/20/2022 0117   BUN 14 02/05/2022 1308   CREATININE 0.84 11/20/2022 0117   CALCIUM 9.1 11/20/2022 0117   PROT 6.5 11/20/2022 0117   PROT 9.0 (H) 02/05/2022 1308   ALBUMIN 3.4 (L) 11/20/2022 0117   ALBUMIN 4.1 02/05/2022 1308   AST 24  11/20/2022 0117   ALT 22 11/20/2022 0117   ALKPHOS 60 11/20/2022 0117   BILITOT 0.5 11/20/2022 0117   BILITOT 0.3 02/05/2022 1308   GFRNONAA >60 11/20/2022 0117   No results found for: "CHOL", "HDL", "LDLCALC", "LDLDIRECT", "TRIG", "CHOLHDL" Lab Results  Component Value Date   HGBA1C 5.1 01/27/2022   Lab Results  Component Value Date   VITAMINB12 500 05/22/2022   Lab Results  Component Value Date   TSH 1.072 05/22/2022        No data to display               No data to display           ASSESSMENT AND PLAN  22 y.o. year old female  has a past medical history of Allergic urticaria, Anxiety and depression, Eczema, Guillain-Barre syndrome (HCC), and Vitamin D deficiency. here with    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HCC)  Paresthesia  Weakness of both lower extremities  Chronic urticaria  Iron deficiency anemia, unspecified iron deficiency anemia type  Abbegail presents with concerns of chronic urticaria despite starting Xolair, cyclosporine and cetirizine. We have reviewed previous labs and workup with PCP, hospitalizations, hematology and allergy. Also having paresthesias and generalized weakness. B12 and ferratin low. She is starting supplements, today. She reports that headaches wax and wane. She is hesitant to start more medications at this time. We have discussed most common triggers for headaches. We could restart gabapentin if needed. Her mother is present with her today and expresses understanding. Savannah Alvarez was encouraged to ensure she  is drinking at least 50-60 ounces of water every day. Regular well balanced meals and exercise also encouraged. She will consider a headache journal to assist with trigger identification and response to medications. She will follow up with Dr Terrace Arabia in 3 months, sooner if needed. She verbalizes understanding and agreement with this plan.    No orders of the defined types were placed in this  encounter.     No orders of the defined types were placed in this encounter.     Shawnie Dapper, MSN, FNP-C 12/10/2022, 4:24 PM  [facility] [address], [address] [place], [place] [address] [phone]

## 2023-03-22 ENCOUNTER — Encounter: Payer: Self-pay | Admitting: Neurology

## 2023-03-22 ENCOUNTER — Ambulatory Visit: Payer: Medicaid Other | Admitting: Neurology

## 2023-04-05 ENCOUNTER — Telehealth: Payer: Self-pay | Admitting: Family Medicine

## 2023-04-05 NOTE — Telephone Encounter (Signed)
Pt called to reschedule appointment.

## 2023-06-14 ENCOUNTER — Other Ambulatory Visit: Payer: Self-pay | Admitting: Neurology

## 2023-07-05 ENCOUNTER — Ambulatory Visit (INDEPENDENT_AMBULATORY_CARE_PROVIDER_SITE_OTHER): Payer: Medicaid Other | Admitting: Neurology

## 2023-07-05 ENCOUNTER — Encounter: Payer: Self-pay | Admitting: Neurology

## 2023-07-05 VITALS — BP 123/74 | HR 81 | Resp 15 | Ht 62.0 in | Wt 165.5 lb

## 2023-07-05 DIAGNOSIS — G629 Polyneuropathy, unspecified: Secondary | ICD-10-CM

## 2023-07-05 DIAGNOSIS — R202 Paresthesia of skin: Secondary | ICD-10-CM

## 2023-07-05 MED ORDER — DULOXETINE HCL 30 MG PO CPEP: 30.0000 mg | ORAL_CAPSULE | Freq: Every day | ORAL | 3 refills | Status: DC

## 2023-07-05 NOTE — Progress Notes (Signed)
 Chief Complaint  Patient presents with   Follow-up    Rm13, mother present, AIDP Return for Savannah Alvarez in 3 months: pt stated that she gets whole body numbness that is constant, worse at times. No other concerns      ASSESSMENT AND PLAN  Savannah Alvarez is a 23 y.o. 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, 2021  Presenting with gait abnormality, to the point of shortness of breath facial weakness palpitation  EMG nerve conduction study in December 2023 showed features consistent with acute demyelinating neuropathy, with absent right lower extremity sensorimotor response, absent right ulnar sensory response, significantly prolonged right ulnar motor distal latency, F-wave latency, with significantly decreased CMAP amplitude, there was no evidence of axonal loss Spinal fluid testing January 29, 2022 showed normal total protein of 45, elevated IgG index  Responded very well to IVIG treatment, reported significant improvement, continued until July 2024  Repeat EMG nerve conduction study on June 01, 2022 continue show demyelinating features,   Patient continue complains of whole body paresthesia, but he EMG nerve conduction study today and physical examination today is perfectly normal, no longer have evidence of demyelinating neuropathy, no evidence of intrinsic muscle disease  Chronic insomnia, anxiety, No longer receiving treatment, which might play a major role in her current complaints, start Cymbalta  30 mg daily, encourage continued follow-up with her primary care  DIAGNOSTIC DATA (LABS, IMAGING, TESTING) - I reviewed patient records, labs, notes, testing and imaging myself where available.  Laboratory evaluation January 27, 2022, normal or negative RPR, TSH, ANA, B6, vitamin D , A1c, rheumatoid factor, B1 CPK slightly elevated 188, repeat CMP showed normal liver functional test, calcium was mildly elevated 10.5, ESR was elevated 45, hemoglobin has  improved to 11.8  Laboratory evaluation from [facility] February 24, 2022, ferritin 65, iron level 42, hemoglobin 10.2, creatinine 0.6, otherwise normal CMP, MEDICAL HISTORY:  Savannah Alvarez is a 23 years old female, accompanied by her mother, referred by Savannah Alvarez for electrodiagnostic study for subacute onset weakness, numbness.    I reviewed and summarized the referring note. PMHX.  She had right hip surgery periacetabular osteotomy at [facility] on December 15, 2021, initially recovered well, pain was well controlled  Second week around December 22, 2021 woke up noticed numbness tingling and numbness of shooting pain at bilateral lower extremity from ankle down, bilateral hands from the wrist down, she also noticed clumsiness of right hand, could not hold her crutches well, fell multiple times since then  Was treated at [facility] emergency room on December 28, 2021, also with complaints of shortness of breath, facial weakness, was found to have mild anemia hemoglobin of 10, mild elevation of AST 84 ALT of 112, magnesium  was decreased 1.8, vitamin D  level was less than 8  She was given magnesium  supplement,  CT PE was negative  EKG showed sinus rhythm, heart rate of 96 bpm  Follow-up visit with orthopedic surgeon December 30, 2021 showed postsurgical change of right periacetabular osteotomy, no new fracture, no hardware complications,  Orthopedic follow-up again January 13, 2022, with complaints of worsening upper and lower extremity weakness, numbness, also developed facial weakness, was given the diagnosis of Bell's palsy, need eyedrop to close her eyes, fell several times, no longer needing narcotics for pain control,  Patient reported her worst time was before Thanksgiving, when she has shortness of breath, palpitation, difficulty closing her eyes, profound bilateral upper and lower extremity weakness, numbness, since then, she seems to  have made slow progress, still have numbness, but not  as dense, not as painful, continue to have weakness, she can close her eyes a little bit better,  Electrodiagnostic study January 28, 2022 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, mainly demyelinating, with mild axonal component  UPDATE Feb 05 2022: She was admitted to hospital December 6 through 11, 2023 with diagnosis of AIDP, Lumbar puncture January 29, 2022: Normal total protein 45, glucose 60, elevated CSF IgG, IgG/albumin ratio, IgG index, WBC of 2  She was started on IVIG, began to notice improvement 2 days after infusion, continue to progress, she now ambulated with a walker, but still have gait abnormality, planning on family cruise December 17 to 23, 2023 She denied chewing difficulty, no swallowing or breathing difficulties  UPDATE Apr 16 2022: Accompanied by her boyfriend at today's visit, overall doing very well, no gait abnormality, no muscle weakness, still have bilateral fingertips and toes paresthesia, sometimes painful, difficulty sleeping because of that  Following her hospital admission, IVIG loading dose 2 g/kg from December 6 -11  2023, she began home health [facility] infusion, maintenance dose 1 g/kg January 22, 23 2024, will receive again on March 4 and 5  She is overall trending up, slower improvement over the past few weeks,  Update May 27, 2022, She is with her mother at today's clinical visit, continue complains of bilateral fingertips and toes paresthesia, no longer have gait abnormality, weakness,  She reported long history of anxiety, getting much worse since her surgery in October 2023, she lives at home, but no longer goes to work or attending school, before this, she was in houses few times each week,  She complains of difficulty sleeping despite polypharmacy including trazodone 100 mg every night, gabapentin  up to 600 mg daily, and Cymbalta  60 mg every morning Also complains of frequent chest pain, shortness of breath with  exertion, presented to emergency room had a CT angiogram of chest on May 23, 2022 that was normal  CT head without contrast, CT venogram of head was normal  UPDATE June 01 2022: She return for repeat electrodiagnostic study, which showed dramatic improvement, but continued evidence of some demyelinating features, as evident by prolonged peak latency at sensory nerve study, prolonged distal latency at motor conduction study.  UPDATE Jul 05 2023: She complains of whole body numbness, seems to be more prominent compared to last year, when she was making steady progress improvement from her AIDP  She was not able to go back to her school since 2021, stay at home, complains of difficulty sleeping, intermittent body weakness, 50% of the time, to the point of difficulty getting out of the bed, taking a shower, feel like her leg is going to give underneath her, not sleeping well,  She was treated with Cymbalta  trazodone in the past, seems to help her some, but no longer taking medication  EMG nerve conduction study today is normal, no evidence of large fiber peripheral neuropathy, demyelinating neuropathy, or intrinsic muscle disease, compared to previous study in April 2024, there is significant recovery.  PHYSICAL EXAM:   Vitals:   07/05/23 1038  BP: 123/74  Pulse: 81  Resp: 15  Weight: 165 lb 8 oz (75.1 kg)  Height: 5\' 2"  (1.575 m)   Body mass index is 30.27 kg/m.  PHYSICAL EXAMNIATION:  Gen: NAD, conversant, well nourised, well groomed                     Cardiovascular:  Regular rate rhythm, no peripheral edema, warm, nontender. Eyes: Conjunctivae clear without exudates or hemorrhage Neck: Supple, no carotid bruits. Pulmonary: Clear to auscultation bilaterally, good air movement at the base of bilateral lung  NEUROLOGICAL EXAM:  MENTAL STATUS: Speech/cognition: Awake, alert oriented to history taking and care conversation CRANIAL NERVES: CN II: Visual fields are full to  confrontation. Pupils are round equal and briskly reactive to light. CN III, IV, VI: extraocular movement are normal. No ptosis. CN V: Facial sensation is intact to light touch CN VII: Face is symmetric with normal eye closure  CN VIII: Hearing is normal to causal conversation. CN IX, X: Phonation is normal. CN XI: Head turning and shoulder shrug are intact  MOTOR: Normal strength of bilateral upper, lower extremity proximal and distal muscle  REFLEXES: Brisk bilateral upper, lower extremity reflex, including ankle reflex, plantar responses were flexor  SENSORY: Normal to light touch, pinprick, vibratory sensation, preserved to toe proprioception  COORDINATION: There is no trunk or limb dysmetria noted.  GAIT/STANCE: Able to get up from low squatting or seated position arm crossed   REVIEW OF SYSTEMS:  Full 14 system review of systems performed and notable only for as above All other review of systems were negative.   ALLERGIES: Allergies  Allergen Reactions   Vegetable Oil Anaphylaxis    Patient states that she is allergic to PALM OIL   Cantaloupe Extract Allergy Skin Test Swelling   Fish Allergy Hives   Onion Swelling   Strawberry (Diagnostic) Hives   Strawberry Extract Hives   Tomato Swelling    HOME MEDICATIONS: Current Outpatient Medications  Medication Sig Dispense Refill   acetaminophen  (TYLENOL ) 500 MG tablet Take 500 mg by mouth every 6 (six) hours as needed for moderate pain (pain score 4-6).     EPINEPHrine  0.3 mg/0.3 mL IJ SOAJ injection Inject 0.3 mg into the muscle as needed for anaphylaxis. 2 each 1   famotidine  (PEPCID ) 20 MG tablet Take 1 tablet (20 mg total) by mouth 2 (two) times daily. 10 tablet 0   hydrOXYzine  (ATARAX ) 25 MG tablet Take 1 tablet (25 mg total) by mouth every 8 (eight) hours as needed. 12 tablet 0   LORazepam  (ATIVAN ) 1 MG tablet Take 0.5-1 tablets (0.5-1 mg total) by mouth 3 (three) times daily as needed for sleep. 5 tablet 0    omalizumab (XOLAIR) 75 MG/0.5ML pen Inject 75 mg into the skin every 30 (thirty) days.     predniSONE  (DELTASONE ) 10 MG tablet Take 2 tablets (20 mg total) by mouth 2 (two) times daily with a meal. 20 tablet 0   No current facility-administered medications for this visit.    PAST MEDICAL HISTORY: Past Medical History:  Diagnosis Date   Allergic urticaria    Anxiety and depression    Eczema    Guillain-Barre syndrome (HCC)    Vitamin D  deficiency     PAST SURGICAL HISTORY: Past Surgical History:  Procedure Laterality Date   HIP SURGERY Right     FAMILY HISTORY: Family History  Problem Relation Age of Onset   Hypertension Father    Diabetes Father    Hyperthyroidism Mother    Diabetes Maternal Grandmother    Hypertension Maternal Grandmother    Diabetes Maternal Grandfather    Hypertension Maternal Grandfather     SOCIAL HISTORY: Social History   Socioeconomic History   Marital status: Single    Spouse name: Not on file   Number of children: 0   Years of education:  Not on file   Highest education level: Some college, no degree  Occupational History    Comment: student in college  Tobacco Use   Smoking status: Never    Passive exposure: Never   Smokeless tobacco: Never  Vaping Use   Vaping status: Never Used  Substance and Sexual Activity   Alcohol use: Not Currently   Drug use: Never   Sexual activity: Yes    Birth control/protection: None  Other Topics Concern   Not on file  Social History Narrative   Lives at college   1 can of [place] per day   Social Drivers of Health   Financial Resource Strain: Not on file  Food Insecurity: No Food Insecurity (01/29/2022)   Hunger Vital Sign    Worried About Running Out of Food in the Last Year: Never true    Ran Out of Food in the Last Year: Never true  Transportation Needs: No Transportation Needs (01/29/2022)   PRAPARE - Administrator, Civil Service (Medical): No    Lack of Transportation  (Non-Medical): No  Physical Activity: Inactive (08/04/2022)   Exercise Vital Sign    Days of Exercise per Week: 0 days    Minutes of Exercise per Session: 0 min  Stress: Not on file  Social Connections: Unknown (07/08/2021)   [facility] from [facility]   Social Network    Social Network: Not on file  Intimate Partner Violence: Not At Risk (02/01/2022)   Humiliation, Afraid, Rape, and Kick questionnaire    Fear of Current or Ex-Partner: No    Emotionally Abused: No    Physically Abused: No    Sexually Abused: No      Phebe Brasil, M.D. Ph.D.  [facility] [address], [address] [place], [place] [address] Ph: [phone] Fax: [phone]  CC:  Freida Jes, FNP [address] [place],  [place] [address]  Freida Jes, FNP

## 2023-07-05 NOTE — Procedures (Signed)
 Full Name: Savannah Alvarez Gender: Female MRN #: [id number] Date of Birth: 07-22-2000    Visit Date: 07/05/2023 11:00 Age: 23 Years Examining Physician: Phebe Brasil Referring Physician: Phebe Brasil Height: 5 feet 2 inch History: 23 year old female with history of AIDP presenting with continued whole body paresthesia,    Summary of the Test: Nerve conduction study: Right median, ulnar sensory motor responses were normal. Right superficial peroneal, sural sensory responses were normal. Right peroneal to EDB tibial motor responses were normal  Electromyography: Selected needle examinations of right upper, lower extremity muscles, cervical and lumbar paraspinal muscles were normal  Conclusion: This is a normal study.  There is no electrodiagnostic evidence of large fiber peripheral neuropathy, or intrinsic muscle disease.  Compared to previous study in April 2024, this has much improved, there is no longer previous evidence of demyelinating neuropathy, prolonged motor distal latency, decreased conduction velocity or prolonged sensory peak latency.    ------------------------------- Phebe Brasil. M.D. Ph.D.   [facility] [address], [address] [place], [place] [address] Tel: [phone] Fax: [phone]  Verbal informed consent was obtained from the patient, patient was informed of potential risk of procedure, including bruising, bleeding, hematoma formation, infection, muscle weakness, muscle pain, numbness, among others.        MNC    Nerve / Sites Muscle Latency Ref. Amplitude Ref. Rel Amp Segments Distance Velocity Ref. Area    ms ms mV mV %  cm m/s m/s mVms  R Median - APB     Wrist APB 3.9 <=4.4 12.9 >=4.0 100 Wrist - APB 7   45.9     Upper arm APB 8.1  13.4  104 Upper arm - Wrist 21 50 >=49 46.3  R Ulnar - ADM     Wrist ADM 3.0 <=3.3 8.8 >=6.0 100 Wrist - ADM 7   27.8     B.Elbow ADM 5.6  8.5  97.2 B.Elbow - Wrist 15 56 >=49 27.5     A.Elbow ADM  8.4  7.8  92.1 A.Elbow - B.Elbow 14 51 >=49 26.3  R Peroneal - EDB     Ankle EDB 5.0 <=6.5 6.9 >=2.0 100 Ankle - EDB 9   20.9     Fib head EDB 10.5  6.1  88.8 Fib head - Ankle 27 49 >=44 19.6     Pop fossa EDB 12.5  6.1  98.8 Pop fossa - Fib head 10 49 >=44 19.3         Pop fossa - Ankle      R Tibial - AH     Ankle AH 5.2 <=5.8 12.2 >=4.0 100 Ankle - AH 9   33.1     Pop fossa AH 14.1  11.0  90.6 Pop fossa - Ankle 47 53 >=41 34.9             SNC    Nerve / Sites Rec. Site Peak Lat Ref.  Amp Ref. Segments Distance    ms ms V V  cm  R Sural - Ankle (Calf)     Calf Ankle 3.7 <=4.4 13 >=6 Calf - Ankle 14  R Superficial peroneal - Ankle     Lat leg Ankle 4.1 <=4.4 8 >=6 Lat leg - Ankle 14  R Median - Orthodromic (Dig II, Mid palm)     Dig II Wrist 3.0 <=3.4 39 >=10 Dig II - Wrist 13  R Ulnar - Orthodromic, (Dig V, Mid palm)  Dig V Wrist 3.0 <=3.1 12 >=5 Dig V - Wrist 40             F  Wave    Nerve F Lat Ref.   ms ms  R Median - APB 26.1 <=31.0  R Ulnar - ADM 29.3 <=32.0  R Peroneal - EDB 44.2 <=56.0  R Tibial - AH 51.4 <=56.0             EMG Summary Table    Spontaneous MUAP Recruitment  Muscle IA Fib PSW Fasc Other Amp Dur. Poly Pattern  R. Tibialis anterior Normal None None None _______ Normal Normal Normal Normal  R. Peroneus longus Normal None None None _______ Normal Normal Normal Normal  R. Gastrocnemius (Medial head) Normal None None None _______ Normal Normal Normal Normal  R. Tibialis posterior Normal None None None _______ Normal Normal Normal Normal  R. Vastus lateralis Normal None None None _______ Normal Normal Normal Normal  L. Lumbar paraspinals (low) Normal None None None _______ Normal Normal Normal Normal  L. Lumbar paraspinals (mid) Normal None None None _______ Normal Normal Normal Normal  R. First dorsal interosseous Normal None None None _______ Normal Normal Normal Normal  R. Pronator teres Normal None None None _______ Normal Normal Normal Normal   R. Biceps brachii Normal None None None _______ Normal Normal Normal Normal  R. Triceps brachii Normal None None None _______ Normal Normal Normal Normal  R. Deltoid Normal None None None _______ Normal Normal Normal Normal  R. Cervical paraspinals Normal None None None _______ Normal Normal Normal Normal

## 2023-07-27 ENCOUNTER — Other Ambulatory Visit: Payer: Self-pay | Admitting: Neurology

## 2023-12-22 ENCOUNTER — Encounter: Payer: Self-pay | Admitting: Neurology

## 2023-12-22 ENCOUNTER — Telehealth: Payer: Self-pay

## 2023-12-22 DIAGNOSIS — R202 Paresthesia of skin: Secondary | ICD-10-CM

## 2023-12-22 DIAGNOSIS — G629 Polyneuropathy, unspecified: Secondary | ICD-10-CM

## 2023-12-22 NOTE — Telephone Encounter (Signed)
 Patient think she is having a GB flare up, pain and numbness on right leg, just had arthroscopic surgery on right hip. She is not sure if this is related to the GB or the surgery. She states the numbness feels like it did on the last flare up. Weakness and limpness in her right leg. No recent illness or infection other than recent surgery. Denies shortness of breath but does have slight chest pain. Advised if chest pain increases or has shortness of breath to go to the ER. She verbalized understanding.

## 2023-12-22 NOTE — Telephone Encounter (Signed)
 She has right hip arthroscopic surgery on Oct 10th, experience right anterior thigh numbness, also painful,   No left leg numbness, also both hands numbness.  EMG/NSC was ordered. Please find a slot for her soon, Ok to double book Nov 13th at [facility]

## 2023-12-22 NOTE — Telephone Encounter (Signed)
 Pt has called asking if there were a RN available to discuss  her believing she is having a flare up to her Guillain-Barre syndrome.  Call was connected to April, RN

## 2023-12-23 NOTE — Telephone Encounter (Signed)
 Spoke with patient to schedule NCV/EMG. She stated she can not come in 11/13 at 4pm because she has class. Is there somewhere else we can add her or should we wait and discuss with patient when she comes in on 11/4?

## 2023-12-23 NOTE — Telephone Encounter (Signed)
 LVM and sent mychart msg informing pt of appt change and advised pt to call back or message me if change does not work for her

## 2023-12-23 NOTE — Telephone Encounter (Signed)
 Called pt at [phone]. She accepted appt for 12/28/23 at 1:30pm with Dr. Onita. Scheduled pt and asked she check in at 1pm. She verbalized understanding and appreciation.

## 2023-12-28 ENCOUNTER — Ambulatory Visit: Admitting: Neurology

## 2023-12-28 ENCOUNTER — Ambulatory Visit (INDEPENDENT_AMBULATORY_CARE_PROVIDER_SITE_OTHER): Admitting: Neurology

## 2023-12-28 ENCOUNTER — Encounter: Payer: Self-pay | Admitting: Neurology

## 2023-12-28 DIAGNOSIS — G629 Polyneuropathy, unspecified: Secondary | ICD-10-CM | POA: Diagnosis not present

## 2023-12-28 DIAGNOSIS — R202 Paresthesia of skin: Secondary | ICD-10-CM | POA: Diagnosis not present

## 2023-12-28 NOTE — Procedures (Signed)
 Full Name: Savannah Alvarez Gender: Female MRN #: [id number] Date of Birth: 12-18-2000    Visit Date: 12/28/2023 13:43 Age: 23 Years Examining Physician: Onita Duos Referring Physician: Onita Duos Height: 5 feet 3 inch History: 23 year old female history of AIDP, presenting with recurrent right upper lower extremity intermittent paresthesia  Summary of the test: Nerve conduction study: Right sural, superficial peroneal sensory responses were normal.  Right median, ulnar radial sensory responses were normal  Right peroneal to EDB, tibial, median, ulnar motor responses were normal  Electromyography: Selected needle examination of right upper, lower extremity muscles cervical and lumbosacral paraspinal muscles were normal  Conclusion: This is a normal study.  There is no electrodiagnostic evidence of peripheral neuropathy.    ------------------------------- Duos Onita. M.D. Ph.D.   [facility] [address] [place], [place] [address] Tel: [phone] Fax: [phone]  Verbal informed consent was obtained from the patient, patient was informed of potential risk of procedure, including bruising, bleeding, hematoma formation, infection, muscle weakness, muscle pain, numbness, among others.        MNC    Nerve / Sites Muscle Latency Ref. Amplitude Ref. Rel Amp Segments Distance Velocity Ref. Area    ms ms mV mV %  cm m/s m/s mVms  R Median - APB     Wrist APB 3.7 <=4.4 10.4 >=4.0 100 Wrist - APB 7   34.1     Upper arm APB 7.4  10.2  97.4 Upper arm - Wrist 23 62 >=49 33.1  R Ulnar - ADM     Wrist ADM 3.1 <=3.3 5.9 >=6.0 100 Wrist - ADM 7   16.3     B.Elbow ADM 5.1  5.3  89.8 B.Elbow - Wrist 14 67 >=49 16.3     A.Elbow ADM 7.8  5.2  97.7 A.Elbow - B.Elbow 14 52 >=49 17.1  R Peroneal - EDB     Ankle EDB 3.8 <=6.5 5.6 >=2.0 100 Ankle - EDB 9   14.2     Fib head EDB 9.4  5.0  90.2 Fib head - Ankle 29 52 >=44 13.4     Pop fossa EDB 11.3  4.8  96.1  Pop fossa - Fib head 11 56 >=44 12.9         Pop fossa - Ankle      R Tibial - AH     Ankle AH 3.3 <=5.8 11.3 >=4.0 100 Ankle - AH 9   21.7     Pop fossa AH 12.3  9.8  87.1 Pop fossa - Ankle 46 51 >=41 21.5             SNC    Nerve / Sites Rec. Site Peak Lat Ref.  Amp Ref. Segments Distance    ms ms V V  cm  R Radial - Anatomical snuff box (Forearm)     Forearm Wrist 1.6 <=2.9 40 >=15 Forearm - Wrist 10  R Sural - Ankle (Calf)     Calf Ankle 3.8 <=4.4 8 >=6 Calf - Ankle 14  R Superficial peroneal - Ankle     Lat leg Ankle 3.4 <=4.4 6 >=6 Lat leg - Ankle 14  R Median - Orthodromic (Dig II, Mid palm)     Dig II Wrist 2.9 <=3.4 14 >=10 Dig II - Wrist 13  R Ulnar - Orthodromic, (Dig V, Mid palm)     Dig V Wrist 2.6 <=3.1 7 >=5 Dig V - Wrist 11  F  Wave    Nerve F Lat Ref.   ms ms  R Tibial - AH 46.9 <=56.0  R Ulnar - ADM 26.9 <=32.0         EMG Summary Table    Spontaneous MUAP Recruitment  Muscle IA Fib PSW Fasc Other Amp Dur. Poly Pattern  R. Biceps brachii Normal None None None _______ Normal Normal Normal Normal  R. Pronator teres Normal None None None _______ Normal Normal Normal Normal  R. Extensor digitorum communis Normal None None None _______ Normal Normal Normal Normal  R. First dorsal interosseous Normal None None None _______ Normal Normal Normal Normal  R. Triceps brachii Normal None None None _______ Normal Normal Normal Normal  R. Cervical paraspinals Normal None None None _______ Normal Normal Normal Normal  R. Tibialis anterior Normal None None None _______ Normal Normal Normal Normal  R. Tibialis posterior Normal None None None _______ Normal Normal Normal Normal  R. Peroneus longus Normal None None None _______ Normal Normal Normal Normal  R. Gastrocnemius (Medial head) Normal None None None _______ Normal Normal Normal Normal  R. Vastus lateralis Normal None None None _______ Normal Normal Normal Normal  R. Lumbar paraspinals (low) Normal None  None None _______ Normal Normal Normal Normal  R. Lumbar paraspinals (mid) Normal None None None _______ Normal Normal Normal Normal

## 2023-12-28 NOTE — Progress Notes (Signed)
 Chief Complaint  Patient presents with   ncs/emg    Emgrm3, alone, NERVE CONDUCTION STUDY 45 - AIDP (acute inflammatory demyelinating polyneuropathy) Paresthesia      ASSESSMENT AND PLAN  Savannah Alvarez is a 23 y.o. 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, 2021  Presenting with gait abnormality, to the point of shortness of breath facial weakness palpitation  EMG nerve conduction study in December 2023 showed features consistent with acute demyelinating neuropathy, with absent right lower extremity sensorimotor response, absent right ulnar sensory response, significantly prolonged right ulnar motor distal latency, F-wave latency, with significantly decreased CMAP amplitude, there was no evidence of axonal loss Spinal fluid testing January 29, 2022 showed normal total protein of 45, elevated IgG index  Responded very well to IVIG treatment, reported significant improvement, continued until July 2024  Repeat EMG nerve conduction study on June 01, 2022 continue show demyelinating features,   Patient has been dealing with chronic anxiety, complains of intermittent whole body paresthesia in May 2025, repeat EMG then was normal   Following her recent arthroscopic right hip surgery, he began to experience intermittent right lower extremity upper extremity paresthesia, we repeat EMG nerve conduction study December 28, 2023, that was essentially normal, no significant change compared to previous study,  Suggest her continue follow-up with primary care for better management of her anxiety  DIAGNOSTIC DATA (LABS, IMAGING, TESTING) - I reviewed patient records, labs, notes, testing and imaging myself where available.  Laboratory evaluation January 27, 2022, normal or negative RPR, TSH, ANA, B6, vitamin D , A1c, rheumatoid factor, B1 CPK slightly elevated 188, repeat CMP showed normal liver functional test, calcium was mildly elevated 10.5, ESR was elevated 45,  hemoglobin has improved to 11.8  Laboratory evaluation from [facility] February 24, 2022, ferritin 65, iron level 42, hemoglobin 10.2, creatinine 0.6, otherwise normal CMP, MEDICAL HISTORY:  Savannah Alvarez is a 23 years old female, accompanied by her mother, referred by Dr. Buck for electrodiagnostic study for subacute onset weakness, numbness.    I reviewed and summarized the referring note. PMHX.  She had right hip surgery periacetabular osteotomy at [facility] on December 15, 2021, initially recovered well, pain was well controlled  Second week around December 22, 2021 woke up noticed numbness tingling and numbness of shooting pain at bilateral lower extremity from ankle down, bilateral hands from the wrist down, she also noticed clumsiness of right hand, could not hold her crutches well, fell multiple times since then  Was treated at [facility] emergency room on December 28, 2021, also with complaints of shortness of breath, facial weakness, was found to have mild anemia hemoglobin of 10, mild elevation of AST 84 ALT of 112, magnesium  was decreased 1.8, vitamin D  level was less than 8  She was given magnesium  supplement,  CT PE was negative  EKG showed sinus rhythm, heart rate of 96 bpm  Follow-up visit with orthopedic surgeon December 30, 2021 showed postsurgical change of right periacetabular osteotomy, no new fracture, no hardware complications,  Orthopedic follow-up again January 13, 2022, with complaints of worsening upper and lower extremity weakness, numbness, also developed facial weakness, was given the diagnosis of Bell's palsy, need eyedrop to close her eyes, fell several times, no longer needing narcotics for pain control,  Patient reported her worst time was before Thanksgiving, when she has shortness of breath, palpitation, difficulty closing her eyes, profound bilateral upper and lower extremity weakness, numbness, since then, she seems to have made slow  progress, still have  numbness, but not as dense, not as painful, continue to have weakness, she can close her eyes a little bit better,  Electrodiagnostic study January 28, 2022 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, mainly demyelinating, with mild axonal component  UPDATE Feb 05 2022: She was admitted to hospital December 6 through 11, 2023 with diagnosis of AIDP, Lumbar puncture January 29, 2022: Normal total protein 45, glucose 60, elevated CSF IgG, IgG/albumin ratio, IgG index, WBC of 2  She was started on IVIG, began to notice improvement 2 days after infusion, continue to progress, she now ambulated with a walker, but still have gait abnormality, planning on family cruise December 17 to 23, 2023 She denied chewing difficulty, no swallowing or breathing difficulties  UPDATE Apr 16 2022: Accompanied by her boyfriend at today's visit, overall doing very well, no gait abnormality, no muscle weakness, still have bilateral fingertips and toes paresthesia, sometimes painful, difficulty sleeping because of that  Following her hospital admission, IVIG loading dose 2 g/kg from December 6 -11  2023, she began home health [facility] infusion, maintenance dose 1 g/kg January 22, 23 2024, will receive again on March 4 and 5  She is overall trending up, slower improvement over the past few weeks,  Update May 27, 2022, She is with her mother at today's clinical visit, continue complains of bilateral fingertips and toes paresthesia, no longer have gait abnormality, weakness,  She reported long history of anxiety, getting much worse since her surgery in October 2023, she lives at home, but no longer goes to work or attending school, before this, she was in houses few times each week,  She complains of difficulty sleeping despite polypharmacy including trazodone 100 mg every night, gabapentin  up to 600 mg daily, and Cymbalta  60 mg every morning Also complains of frequent chest pain, shortness of  breath with exertion, presented to emergency room had a CT angiogram of chest on May 23, 2022 that was normal  CT head without contrast, CT venogram of head was normal  UPDATE June 01 2022: She return for repeat electrodiagnostic study, which showed dramatic improvement, but continued evidence of some demyelinating features, as evident by prolonged peak latency at sensory nerve study, prolonged distal latency at motor conduction study.  UPDATE Jul 05 2023: She complains of whole body numbness, seems to be more prominent compared to last year, when she was making steady progress improvement from her AIDP  She was not able to go back to her school since 2021, stay at home, complains of difficulty sleeping, intermittent body weakness, 50% of the time, to the point of difficulty getting out of the bed, taking a shower, feel like her leg is going to give underneath her, not sleeping well,  She was treated with Cymbalta  trazodone in the past, seems to help her some, but no longer taking medication  EMG nerve conduction study today is normal, no evidence of large fiber peripheral neuropathy, demyelinating neuropathy, or intrinsic muscle disease, compared to previous study in April 2024, there is significant recovery.  Update December 28, 2023, She underwent another arthroscopic right hip surgery in September 2025, began weightbearing slowly, 2 weeks postsurgery, around December 08, 2023, she noticed right leg numbness from knee down, intermittent right hand numbness, worry about recurrent Guillain-Barr, symptoms were intermittent,  EMG nerve conduction study today is normal, no evidence of demyelinating neuropathy,  PHYSICAL EXAM:   Vitals:   12/28/23 1325  BP: 118/76  Pulse: 100  Resp: 15  SpO2: 96%  Height: 5' 3 (1.6 m)   Body mass index is 29.32 kg/m.  PHYSICAL EXAMNIATION:  Gen: NAD, conversant, well nourised, well groomed                     Cardiovascular: Regular rate rhythm,  no peripheral edema, warm, nontender. Eyes: Conjunctivae clear without exudates or hemorrhage Neck: Supple, no carotid bruits. Pulmonary: Clear to auscultation bilaterally, good air movement at the base of bilateral lung  NEUROLOGICAL EXAM:  MENTAL STATUS: Speech/cognition: Awake, alert oriented to history taking and care conversation CRANIAL NERVES: CN II: Visual fields are full to confrontation. Pupils are round equal and briskly reactive to light. CN III, IV, VI: extraocular movement are normal. No ptosis. CN V: Facial sensation is intact to light touch CN VII: Face is symmetric with normal eye closure  CN VIII: Hearing is normal to causal conversation. CN IX, X: Phonation is normal. CN XI: Head turning and shoulder shrug are intact  MOTOR: Normal strength of bilateral upper, lower extremity proximal and distal muscle  REFLEXES: Brisk bilateral upper, lower extremity reflex, including ankle reflex, plantar responses were flexor  SENSORY: Normal to light touch, pinprick, vibratory sensation, preserved to toe proprioception  COORDINATION: There is no trunk or limb dysmetria noted.  GAIT/STANCE: Push-up, mild antalgic,   REVIEW OF SYSTEMS:  Full 14 system review of systems performed and notable only for as above All other review of systems were negative.   ALLERGIES: Allergies  Allergen Reactions   Cantaloupe Extract Allergy Skin Test Swelling   Onion Swelling   Tomato Swelling   Vegetable Oil Anaphylaxis    Patient states that she is allergic to PALM OIL   Strawberry (Diagnostic) Hives   Fish Allergy Hives   Strawberry Extract Hives    HOME MEDICATIONS: Current Outpatient Medications  Medication Sig Dispense Refill   acetaminophen  (TYLENOL ) 500 MG tablet Take 500 mg by mouth every 6 (six) hours as needed for moderate pain (pain score 4-6).     omalizumab (XOLAIR) 75 MG/0.5ML pen Inject 75 mg into the skin every 30 (thirty) days.     No current  facility-administered medications for this visit.    PAST MEDICAL HISTORY: Past Medical History:  Diagnosis Date   Allergic urticaria    Anxiety and depression    Eczema    Guillain-Barre syndrome    Vitamin D  deficiency     PAST SURGICAL HISTORY: Past Surgical History:  Procedure Laterality Date   HIP SURGERY Right     FAMILY HISTORY: Family History  Problem Relation Age of Onset   Hypertension Father    Diabetes Father    Hyperthyroidism Mother    Diabetes Maternal Grandmother    Hypertension Maternal Grandmother    Diabetes Maternal Grandfather    Hypertension Maternal Grandfather     SOCIAL HISTORY: Social History   Socioeconomic History   Marital status: Single    Spouse name: Not on file   Number of children: 0   Years of education: Not on file   Highest education level: Some college, no degree  Occupational History    Comment: student in college  Tobacco Use   Smoking status: Never    Passive exposure: Never   Smokeless tobacco: Never  Vaping Use   Vaping status: Never Used  Substance and Sexual Activity   Alcohol use: Not Currently   Drug use: Never   Sexual activity: Yes    Birth control/protection: None  Other Topics Concern  Not on file  Social History Narrative   Lives at college   1 can of Soda per day   Social Drivers of Health   Financial Resource Strain: Not on file  Food Insecurity: No Food Insecurity (01/29/2022)   Hunger Vital Sign    Worried About Running Out of Food in the Last Year: Never true    Ran Out of Food in the Last Year: Never true  Transportation Needs: No Transportation Needs (01/29/2022)   PRAPARE - Administrator, Civil Service (Medical): No    Lack of Transportation (Non-Medical): No  Physical Activity: Inactive (08/04/2022)   Exercise Vital Sign    Days of Exercise per Week: 0 days    Minutes of Exercise per Session: 0 min  Stress: Not on file  Social Connections: Unknown (07/08/2021)   Received  from [facility]   Social Network    Social Network: Not on file  Intimate Partner Violence: Not At Risk (02/01/2022)   Humiliation, Afraid, Rape, and Kick questionnaire    Fear of Current or Ex-Partner: No    Emotionally Abused: No    Physically Abused: No    Sexually Abused: No      Modena Callander, M.D. Ph.D.  [facility] [address], [address] [place], [place] [address] Ph: [phone] Fax: [phone]  CC:  Orlando Dwayne NOVAK, FNP [address] [place],  [place] [address]  Orlando Dwayne NOVAK, FNP

## 2024-01-11 ENCOUNTER — Emergency Department (HOSPITAL_BASED_OUTPATIENT_CLINIC_OR_DEPARTMENT_OTHER)
Admission: EM | Admit: 2024-01-11 | Discharge: 2024-01-11 | Disposition: A | Discharge status: Home / Self Care | Attending: Emergency Medicine | Admitting: Emergency Medicine

## 2024-01-11 ENCOUNTER — Other Ambulatory Visit: Payer: Self-pay

## 2024-01-11 DIAGNOSIS — T7840XA Allergy, unspecified, initial encounter: Secondary | ICD-10-CM | POA: Diagnosis not present

## 2024-01-11 DIAGNOSIS — R220 Localized swelling, mass and lump, head: Secondary | ICD-10-CM | POA: Diagnosis present

## 2024-01-11 MED ORDER — EPINEPHRINE 0.3 MG/0.3ML IJ SOAJ: 0.3000 mg | INTRAMUSCULAR | 0 refills | Status: AC | PRN

## 2024-01-11 NOTE — Discharge Instructions (Addendum)
 Your symptoms of allergic reaction improved with Benadryl , your EpiPen  prescription has been refilled.  Return for any worsening symptoms.

## 2024-01-11 NOTE — ED Provider Notes (Signed)
 [place] EMERGENCY DEPARTMENT AT [facility] Provider Note   CSN: [id number] Arrival date & time: 01/11/24  2131     Patient presents with: Allergic Reaction   Kaitrin Seybold is a 23 y.o. female.    Allergic Reaction    23 year old female with medical history significant for allergic urticaria and angioedema, GBS/AIDP presenting to the emergency department with a chief complaint of tongue swelling and lip swelling.  The patient denies any known suspect food intake.  She states that earlier this evening at around 8 PM she developed sudden onset right sided tongue swelling and upper lip swelling.  She took 50 mg of Benadryl  and symptoms have since resolved.  She denies any shortness of breath, denies any nausea or vomiting, did not have any hives.  She states that she has run out of her EpiPen  and needs a new prescription.  Prior to Admission medications   Medication Sig Start Date End Date Taking? Authorizing Provider  EPINEPHrine  0.3 mg/0.3 mL IJ SOAJ injection Inject 0.3 mg into the muscle as needed for anaphylaxis. 01/11/24  Yes Jerrol Agent, MD  acetaminophen  (TYLENOL ) 500 MG tablet Take 500 mg by mouth every 6 (six) hours as needed for moderate pain (pain score 4-6).    [provider]  omalizumab CIPRIANO) 75 MG/0.5ML pen Inject 75 mg into the skin every 30 (thirty) days.    [provider]    Allergies: Cantaloupe extract allergy skin test, Onion, Tomato, Vegetable oil, Rosemary oil, Strawberry (diagnostic), Fish allergy, and Strawberry extract    Review of Systems  All other systems reviewed and are negative.   Updated Vital Signs BP 122/73   Pulse 95   Temp 98.8 F (37.1 C) (Oral)   Resp 18   Ht 5' 2 (1.575 m)   Wt 72.6 kg   SpO2 100%   BMI 29.26 kg/m   Physical Exam Vitals and nursing note reviewed.  Constitutional:      General: She is not in acute distress. HENT:     Head: Normocephalic and atraumatic.     Comments: No  evidence of angioedema Eyes:     Conjunctiva/sclera: Conjunctivae normal.     Pupils: Pupils are equal, round, and reactive to light.  Cardiovascular:     Rate and Rhythm: Normal rate and regular rhythm.  Pulmonary:     Effort: Pulmonary effort is normal. No respiratory distress.     Comments: Lungs CTAB Abdominal:     General: There is no distension.     Tenderness: There is no guarding.     Comments: Abdomen soft and nontender  Musculoskeletal:        General: No deformity or signs of injury.     Cervical back: Neck supple.  Skin:    Findings: No lesion or rash.     Comments: No rash  Neurological:     General: No focal deficit present.     Mental Status: She is alert. Mental status is at baseline.     (all labs ordered are listed, but only abnormal results are displayed) Labs Reviewed - No data to display  EKG: None  Radiology: No results found.   Procedures   Medications Ordered in the ED - No data to display                                  Medical Decision Making Risk Prescription drug management.  23 year old female with medical history significant for allergic urticaria and angioedema, GBS/AIDP presenting to the emergency department with a chief complaint of tongue swelling and lip swelling.  The patient denies any known suspect food intake.  She states that earlier this evening at around 8 PM she developed sudden onset right sided tongue swelling and upper lip swelling.  She took 50 mg of Benadryl  and symptoms have since resolved.  She denies any shortness of breath, denies any nausea or vomiting, did not have any hives.  She states that she has run out of her EpiPen  and needs a new prescription.  On arrival, the patient was vitally stable.  Symptoms of allergic reaction or angioedema appeared to have resolved or are resolving on my evaluation after the patient self administered 50 mg of Benadryl .  She states that she feels better.  She request a refill of  her EpiPen .  No evidence for anaphylaxis at this time.  Do not think the patient needs full observation of 4 hours as EpiPen  not administered and symptoms of angioedema are not worsening but have rapidly improved.  Patient at this time is overall stable for discharge, vitally stable and appears safe for discharge.     Final diagnoses:  Allergic reaction, initial encounter    ED Discharge Orders          Ordered    EPINEPHrine  0.3 mg/0.3 mL IJ SOAJ injection  As needed        01/11/24 2232               Jerrol Agent, MD 01/11/24 2314

## 2024-01-11 NOTE — ED Triage Notes (Signed)
 Pt POV reporting allergic reaction, unaware of cause, R side lip and tongue swelling, airway intact, hx autoimmune chronic urticaria.
